# Patient Record
Sex: Male | Born: 1950 | Race: White | Hispanic: No | Smoking: Former smoker
Health system: Southern US, Community
[De-identification: ages and names within clinical notes are randomized; demographics above are authoritative.]

## PROBLEM LIST (undated history)

## (undated) DIAGNOSIS — Z87442 Personal history of urinary calculi: Secondary | ICD-10-CM

## (undated) DIAGNOSIS — K219 Gastro-esophageal reflux disease without esophagitis: Secondary | ICD-10-CM

## (undated) DIAGNOSIS — I10 Essential (primary) hypertension: Secondary | ICD-10-CM

## (undated) DIAGNOSIS — I2510 Atherosclerotic heart disease of native coronary artery without angina pectoris: Secondary | ICD-10-CM

## (undated) DIAGNOSIS — N200 Calculus of kidney: Secondary | ICD-10-CM

## (undated) DIAGNOSIS — E119 Type 2 diabetes mellitus without complications: Secondary | ICD-10-CM

## (undated) DIAGNOSIS — E785 Hyperlipidemia, unspecified: Secondary | ICD-10-CM

## (undated) DIAGNOSIS — M1990 Unspecified osteoarthritis, unspecified site: Secondary | ICD-10-CM

## (undated) DIAGNOSIS — F329 Major depressive disorder, single episode, unspecified: Secondary | ICD-10-CM

## (undated) DIAGNOSIS — R7989 Other specified abnormal findings of blood chemistry: Secondary | ICD-10-CM

## (undated) DIAGNOSIS — F419 Anxiety disorder, unspecified: Secondary | ICD-10-CM

## (undated) DIAGNOSIS — F32A Depression, unspecified: Secondary | ICD-10-CM

## (undated) HISTORY — DX: Major depressive disorder, single episode, unspecified: F32.9

## (undated) HISTORY — DX: Personal history of urinary calculi: Z87.442

## (undated) HISTORY — PX: COLONOSCOPY: SHX174

## (undated) HISTORY — DX: Depression, unspecified: F32.A

## (undated) HISTORY — PX: OTHER SURGICAL HISTORY: SHX169

## (undated) HISTORY — DX: Type 2 diabetes mellitus without complications: E11.9

## (undated) HISTORY — DX: Unspecified osteoarthritis, unspecified site: M19.90

## (undated) HISTORY — PX: VASECTOMY: SHX75

## (undated) HISTORY — DX: Gastro-esophageal reflux disease without esophagitis: K21.9

## (undated) HISTORY — DX: Calculus of kidney: N20.0

## (undated) HISTORY — DX: Essential (primary) hypertension: I10

---

## 2000-02-16 ENCOUNTER — Emergency Department (HOSPITAL_COMMUNITY): Admission: EM | Admit: 2000-02-16 | Discharge: 2000-02-16 | Payer: Self-pay

## 2003-10-31 ENCOUNTER — Emergency Department (HOSPITAL_COMMUNITY): Admission: EM | Admit: 2003-10-31 | Discharge: 2003-10-31 | Payer: Self-pay

## 2013-10-30 ENCOUNTER — Ambulatory Visit (INDEPENDENT_AMBULATORY_CARE_PROVIDER_SITE_OTHER): Payer: Self-pay | Admitting: Family Medicine

## 2013-10-30 VITALS — BP 152/90 | HR 109 | Temp 98.5°F | Resp 16 | Ht 71.5 in | Wt 220.0 lb

## 2013-10-30 DIAGNOSIS — H8110 Benign paroxysmal vertigo, unspecified ear: Secondary | ICD-10-CM

## 2013-10-30 DIAGNOSIS — R42 Dizziness and giddiness: Secondary | ICD-10-CM

## 2013-10-30 LAB — POCT CBC
Granulocyte percent: 69.6 %G (ref 37–80)
HCT, POC: 36 % — AB (ref 43.5–53.7)
Hemoglobin: 11.5 g/dL — AB (ref 14.1–18.1)
Lymph, poc: 2.8 (ref 0.6–3.4)
MCH, POC: 24.8 pg — AB (ref 27–31.2)
MCHC: 32 g/dL (ref 31.8–35.4)
MCV: 77.7 fL — AB (ref 80–97)
MID (cbc): 0.6 (ref 0–0.9)
MPV: 5.6 fL (ref 0–99.8)
POC Granulocyte: 7.9 — AB (ref 2–6.9)
POC LYMPH PERCENT: 24.9 %L (ref 10–50)
POC MID %: 5.5 %M (ref 0–12)
Platelet Count, POC: 422 10*3/uL (ref 142–424)
RBC: 4.63 M/uL — AB (ref 4.69–6.13)
RDW, POC: 17.4 %
WBC: 11.4 10*3/uL — AB (ref 4.6–10.2)

## 2013-10-30 LAB — POCT SEDIMENTATION RATE: POCT SED RATE: 118 mm/hr — AB (ref 0–22)

## 2013-10-30 LAB — GLUCOSE, POCT (MANUAL RESULT ENTRY): POC Glucose: 85 mg/dl (ref 70–99)

## 2013-10-30 MED ORDER — MECLIZINE HCL 25 MG PO TABS: 25.0000 mg | ORAL_TABLET | Freq: Three times a day (TID) | ORAL | Status: DC | PRN

## 2013-10-30 NOTE — Progress Notes (Addendum)
This chart was scribed for Delman Cheadle, MD by Einar Pheasant, ED Scribe. This patient was seen in room 3 and the patient's care was started at 1:55 PM.  Subjective:    Patient ID: Allen Whitehead, male    DOB: 1950-05-01, 63 y.o.   MRN: [id number]  Chief Complaint  Patient presents with  . Dizziness    HPI Allen Whitehead is a 63 y.o. male with a hx of kidney stones, DM, and depression. Presents today with his wife. Has not had medical care in >7 yrs as he does not have health insurance - is self-pay.  At that time in 2008 he states he had no medical problems and labs were excellent.  Pt states that 8 years ago he was diagnosed with Vertigo but because of insurance lapse he hasn't been able to follow up with his symptoms. He states that he takes Bonine periodically but reports taking it everyday, but that didn't make his symptoms better; it actually made his "dizziness" worse. However, with this current episodes the "dizziness" is present all the time for the past 2 months. He says that he has a hard time maintaining his balance and endorses associated bilateral leg weakness. He denies any change in appetite, chest pain, family hx of neuropathy, or falls.   There are no active problems to display for this patient.  Past Medical History  Diagnosis Date  . Depression   . History of kidney stones    No past surgical history on file. No Known Allergies Prior to Admission medications   Not on File   History   Social History  . Marital Status: Legally Separated    Spouse Name: N/A    Number of Children: N/A  . Years of Education: N/A   Occupational History  . Not on file.   Social History Main Topics  . Smoking status: Current Every Day Smoker -- 0.50 packs/day    Types: Cigarettes  . Smokeless tobacco: Never Used  . Alcohol Use: No  . Drug Use: No  . Sexual Activity: Not on file   Other Topics Concern  . Not on file   Social History Narrative  . No narrative on file    Review of Systems  Constitutional: Negative for fever, chills and diaphoresis.  HENT: Negative for congestion, ear discharge and sinus pressure.   Eyes: Negative for discharge.  Respiratory: Negative for cough.   Cardiovascular: Negative for chest pain.  Gastrointestinal: Negative for abdominal pain and diarrhea.  Genitourinary: Negative for frequency and hematuria.  Musculoskeletal: Negative for back pain.  Skin: Negative for rash.  Neurological: Positive for dizziness, weakness and light-headedness. Negative for seizures and headaches.  Psychiatric/Behavioral: Negative for hallucinations.      Triage vitals: BP 152/90  Pulse 109  Temp(Src) 98.5 F (36.9 C) (Oral)  Resp 16  Ht 5' 11.5" (1.816 m)  Wt 220 lb (99.791 kg)  BMI 30.26 kg/m2  SpO2 99%  Objective:   Physical Exam  Nursing note and vitals reviewed. Constitutional: He is oriented to person, place, and time. He appears well-developed and well-nourished. No distress.  HENT:  Head: Normocephalic and atraumatic.  Right Ear: Tympanic membrane normal.  Left Ear: Tympanic membrane normal.  Mouth/Throat: Uvula is midline, oropharynx is clear and moist and mucous membranes are normal.  Eyes: Conjunctivae are normal. Right eye exhibits no discharge. Left eye exhibits no discharge.  Neck: Neck supple.  Cardiovascular: Regular rhythm, S1 normal, S2 normal and normal heart sounds.  Tachycardia present.  Exam reveals no gallop and no friction rub.   No murmur heard. Pulses:      Carotid pulses are 2+ on the right side, and 2+ on the left side. No carotid bruits.  Pulmonary/Chest: Effort normal and breath sounds normal. No respiratory distress.  Decreased air movement, but lungs clear throughout.  Abdominal: Soft. Bowel sounds are normal. He exhibits no distension. There is no tenderness.  Musculoskeletal: Normal range of motion. He exhibits no edema and no tenderness.  Lymphadenopathy:    He has no cervical adenopathy.     He has no axillary adenopathy.  1.0 cm firm fluctuant subcutaneous cyst well defined with no erythema or tenderness. No induration.   Neurological: He is alert and oriented to person, place, and time.  Reflex Scores:      Patellar reflexes are 2+ on the right side and 2+ on the left side.      Achilles reflexes are 1+ on the right side and 1+ on the left side. 5/5 strength to lower extremities. Sig ataxia with poor tandem gait.  Skin: Skin is warm and dry.  Psychiatric: He has a normal mood and affect. His behavior is normal. Thought content normal.   Positive orthostatics EKG: sinus tach, no acute ischemic change Assessment & Plan:   Dizziness - Plan: EKG 12-Lead, POCT CBC, POCT glucose (manual entry), POCT SEDIMENTATION RATE, TSH  Benign paroxysmal positional vertigo, unspecified laterality - Plan: refilled meclizine.  Ova and parasite examination  Orthostatic lightheadedness Pt is orthostatic and anemic. As he is long-time smoker, it is likely that his nml hgb would be at the higher range so likely this mild anemia is much more symptomatic for pt.  Unsure of cause of orthostasis. If sxs cont, rec cardiology eval but pt declines for now due to cost.  Due to microcytic anemia, concern for GI bleed but pt refuses rectal or prostate exam in office - does agree to do home hemosure. Push fluids, add a little salt into diet.  Recheck in 2d w/ me. I am concerned pt could have colon cancer due to microcytic anemia of unknown etiology as well as his recent unintentional 40lb weight loss with stool changes but pt very adament that he is better now - these have entirely resolved and he is gaining weight..  Meds ordered this encounter  Medications  . meclizine (ANTIVERT) 25 MG tablet    Sig: Take 1 tablet (25 mg total) by mouth 3 (three) times daily as needed for dizziness.    Dispense:  60 tablet    Refill:  2    I personally performed the services described in this documentation, which was  scribed in my presence. The recorded information has been reviewed and considered, and addended by me as needed.  Delman Cheadle, MD MPH   Results for orders placed in visit on 10/30/13  TSH      Result Value Ref Range   TSH 1.900  0.350 - 4.500 uIU/mL  POCT CBC      Result Value Ref Range   WBC 11.4 (*) 4.6 - 10.2 K/uL   Lymph, poc 2.8  0.6 - 3.4   POC LYMPH PERCENT 24.9  10 - 50 %L   MID (cbc) 0.6  0 - 0.9   POC MID % 5.5  0 - 12 %M   POC Granulocyte 7.9 (*) 2 - 6.9   Granulocyte percent 69.6  37 - 80 %G   RBC 4.63 (*) 4.69 -  6.13 M/uL   Hemoglobin 11.5 (*) 14.1 - 18.1 g/dL   HCT, POC 36.0 (*) 43.5 - 53.7 %   MCV 77.7 (*) 80 - 97 fL   MCH, POC 24.8 (*) 27 - 31.2 pg   MCHC 32.0  31.8 - 35.4 g/dL   RDW, POC 17.4     Platelet Count, POC 422  142 - 424 K/uL   MPV 5.6  0 - 99.8 fL  GLUCOSE, POCT (MANUAL RESULT ENTRY)      Result Value Ref Range   POC Glucose 85  70 - 99 mg/dl  POCT SEDIMENTATION RATE      Result Value Ref Range   POCT SED RATE 118 (*) 0 - 22 mm/hr

## 2013-10-30 NOTE — Patient Instructions (Signed)
Orthostatic Hypotension Orthostatic hypotension is a sudden drop in blood pressure. It happens when you quickly stand up from a seated or lying position. You may feel dizzy or light-headed. This can last for just a few seconds or for up to a few minutes. It is usually not a serious problem. However, if this happens frequently or gets worse, it can be a sign of something more serious. CAUSES  Different things can cause orthostatic hypotension, including:   Loss of body fluids (dehydration).  Medicines that lower blood pressure.  Sudden changes in posture, such as standing up quickly after you have been sitting or lying down.  Taking too much of your medicine. SIGNS AND SYMPTOMS   Light-headedness or dizziness.   Fainting or near-fainting.   A fast heart rate.   Weakness.   Feeling tired (fatigue).  DIAGNOSIS  Your health care provider may do several things to help diagnose your condition and identify the cause. These may include:   Taking a medical history and doing a physical exam.  Checking your blood pressure. Your health care provider will check your blood pressure when you are:  Lying down.  Sitting.  Standing.  Using tilt table testing. In this test, you lie down on a table that moves from a lying position to a standing position. You will be strapped onto the table. This test monitors your blood pressure and heart rate when you are in different positions. TREATMENT  Treatment will vary depending on the cause. Possible treatments include:   Changing the dosage of your medicines.  Wearing compression stockings on your lower legs.  Standing up slowly after sitting or lying down.  Eating more salt.  Eating frequent, small meals.  In some cases, getting IV fluids.  Taking medicine to enhance fluid retention. HOME CARE INSTRUCTIONS  Only take over-the-counter or prescription medicines as directed by your health care provider.  Follow your health care  provider's instructions for changing the dosage of your current medicines.  Do not stop or adjust your medicine on your own.  Stand up slowly after sitting or lying down. This allows your body to adjust to the different position.  Wear compression stockings as directed.  Eat extra salt as directed.  Do not add extra salt to your diet unless directed to by your health care provider.  Eat frequent, small meals.  Avoid standing suddenly after eating.  Avoid hot showers or excessive heat as directed by your health care provider.  Keep all follow-up appointments. SEEK MEDICAL CARE IF:  You continue to feel dizzy or light-headed after standing.  You feel groggy or confused.  You feel cold, clammy, or sick to your stomach (nauseous).  You have blurred vision.  You feel short of breath. SEEK IMMEDIATE MEDICAL CARE IF:   You faint after standing.  You have chest pain.  You have difficulty breathing.   You lose feeling or movement in your arms or legs.   You have slurred speech or difficulty talking, or you are unable to talk.  MAKE SURE YOU:   Understand these instructions.  Will watch your condition.  Will get help right away if you are not doing well or get worse. Document Released: 12/28/2001 Document Revised: 01/12/2013 Document Reviewed: 10/30/2012 [facility] Patient Information 2015 [place], [place]. This information is not intended to replace advice given to you by your health care provider. Make sure you discuss any questions you have with your health care provider. Postural Orthostatic Tachycardia Syndrome Postural orthostatic tachycardia syndrome (  POTS) is an increased heart rate when going from a lying (supine) position to a standing position. The heart rate may increase more than 30 beats per minute (BPM) above its resting rate when going from a lying to a standing position. POTS occurs more frequently in women than in men.  SYMPTOMS  POTS symptoms may be  increased in the morning. Symptoms of POTS include:  Fainting or near fainting.  Inability to think clearly.  Extreme or chronic fatigue.  Exercise intolerance.  Chest pain.  Having the lower legs develop a reddish-blue color due to decreased blood flow (acrocyanosis). CAUSES POTS can be caused by different conditions. Sometimes, it has no known cause (idiopathic). Some causes of POTS include:  Viral illness.  Pregnancy.  Autoimmune diseases.  Medications.  Major surgery.  Trauma such as a car accident or major injury.  Medical conditions such as anemia, dehydration, and hyperthyroidism. DIAGNOSIS  POTS is diagnosed by:  Taking a complete history and physical exam.  Measuring the heart rate while lying and then upon standing.  Measuring blood pressure when going from a lying to a standing position. POTS is usually not associated with low blood pressure (orthostatic hypotension) when going from a lying to standing position. While standing, blood pressure should be taken 2, 5, and 10 minutes after getting up. TREATMENT  Treatment of POTS depends upon the severity of the symptoms. Treatment includes:  Drinking plenty of fluids to avoid getting dehydrated.  Avoiding very hot environments to not get overheated.  Increasing your dietary salt intake as instructed by your caregiver.  Taking different types of medications as prescribed for POTS.  Avoiding some classes of medications such as vasodilators and diuretics. SEEK IMMEDIATE MEDICAL CARE IF  You have severe chest pain that does not go away. Call your local emergency service immediately.  You feel your heart racing or beating rapidly.  You feel like passing out.  You have very confused thinking. MAKE SURE YOU  Understand these instructions.  Will watch your condition.  Will get help right away if you are not doing well or get worse. Document Released: 12/28/2001 Document Revised: 05/24/2013 Document  Reviewed: 03/07/2010 [facility] Patient Information 2015 [place], [place]. This information is not intended to replace advice given to you by your health care provider. Make sure you discuss any questions you have with your health care provider. Near-Syncope Near-syncope (commonly known as near fainting) is sudden weakness, dizziness, or feeling like you might pass out. During an episode of near-syncope, you may also develop pale skin, have tunnel vision, or feel sick to your stomach (nauseous). Near-syncope may occur when getting up after sitting or while standing for a long time. It is caused by a sudden decrease in blood flow to the brain. This decrease can result from various causes or triggers, most of which are not serious. However, because near-syncope can sometimes be a sign of something serious, a medical evaluation is required. The specific cause is often not determined. HOME CARE INSTRUCTIONS  Monitor your condition for any changes. The following actions may help to alleviate any discomfort you are experiencing:  Have someone stay with you until you feel stable.  Lie down right away and prop your feet up if you start feeling like you might faint. Breathe deeply and steadily. Wait until all the symptoms have passed. Most of these episodes last only a few minutes. You may feel tired for several hours.   Drink enough fluids to keep your urine clear or pale  yellow.   If you are taking blood pressure or heart medicine, get up slowly when seated or lying down. Take several minutes to sit and then stand. This can reduce dizziness.  Follow up with your health care provider as directed. SEEK IMMEDIATE MEDICAL CARE IF:   You have a severe headache.   You have unusual pain in the chest, abdomen, or back.   You are bleeding from the mouth or rectum, or you have black or tarry stool.   You have an irregular or very fast heartbeat.   You have repeated fainting or have seizure-like jerking  during an episode.   You faint when sitting or lying down.   You have confusion.   You have difficulty walking.   You have severe weakness.   You have vision problems.  MAKE SURE YOU:   Understand these instructions.  Will watch your condition.  Will get help right away if you are not doing well or get worse. Document Released: 01/07/2005 Document Revised: 01/12/2013 Document Reviewed: 06/12/2012 [facility] Patient Information 2015 [place], [place]. This information is not intended to replace advice given to you by your health care provider. Make sure you discuss any questions you have with your health care provider. Iron Deficiency Anemia Anemia is a condition in which there are less red blood cells or hemoglobin in the blood than normal. Hemoglobin is the part of red blood cells that carries oxygen. Iron deficiency anemia is anemia caused by too little iron. It is the most common type of anemia. It may leave you tired and short of breath. CAUSES   Lack of iron in the diet.  Poor absorption of iron, as seen with intestinal disorders.  Intestinal bleeding.  Heavy periods. SIGNS AND SYMPTOMS  Mild anemia may not be noticeable. Symptoms may include:  Fatigue.  Headache.  Pale skin.  Weakness.  Tiredness.  Shortness of breath.  Dizziness.  Cold hands and feet.  Fast or irregular heartbeat. DIAGNOSIS  Diagnosis requires a thorough evaluation and physical exam by your health care provider. Blood tests are generally used to confirm iron deficiency anemia. Additional tests may be done to find the underlying cause of your anemia. These may include:  Testing for blood in the stool (fecal occult blood test).  A procedure to see inside the colon and rectum (colonoscopy).  A procedure to see inside the esophagus and stomach (endoscopy). TREATMENT  Iron deficiency anemia is treated by correcting the cause of the deficiency. Treatment may involve:  Adding iron-rich  foods to your diet.  Taking iron supplements. Pregnant or breastfeeding women need to take extra iron because their normal diet usually does not provide the required amount.  Taking vitamins. Vitamin C improves the absorption of iron. Your health care provider may recommend that you take your iron tablets with a glass of orange juice or vitamin C supplement.  Medicines to make heavy menstrual flow lighter.  Surgery. HOME CARE INSTRUCTIONS   Take iron as directed by your health care provider.  If you cannot tolerate taking iron supplements by mouth, talk to your health care provider about taking them through a vein (intravenously) or an injection into a muscle.  For the best iron absorption, iron supplements should be taken on an empty stomach. If you cannot tolerate them on an empty stomach, you may need to take them with food.  Do not drink milk or take antacids at the same time as your iron supplements. Milk and antacids may interfere with the  absorption of iron.  Iron supplements can cause constipation. Make sure to include fiber in your diet to prevent constipation. A stool softener may also be recommended.  Take vitamins as directed by your health care provider.  Eat a diet rich in iron. Foods high in iron include liver, lean beef, whole-grain bread, eggs, dried fruit, and dark green leafy vegetables. SEEK IMMEDIATE MEDICAL CARE IF:   You faint. If this happens, do not drive. Call your local emergency services (911 in [place] if no other help is available.  You have chest pain.  You feel nauseous or vomit.  You have severe or increased shortness of breath with activity.  You feel weak.  You have a rapid heartbeat.  You have unexplained sweating.  You become light-headed when getting up from a chair or bed. MAKE SURE YOU:   Understand these instructions.  Will watch your condition.  Will get help right away if you are not doing well or get worse. Document Released:  01/05/2000 Document Revised: 01/12/2013 Document Reviewed: 09/14/2012 [facility] Patient Information 2015 [place], [place]. This information is not intended to replace advice given to you by your health care provider. Make sure you discuss any questions you have with your health care provider. Iron-Rich Diet An iron-rich diet contains foods that are good sources of iron. Iron is an important mineral that helps your body produce hemoglobin. Hemoglobin is a protein in red blood cells that carries oxygen to the body's tissues. Sometimes, the iron level in your blood can be low. This may be caused by:  A lack of iron in your diet.  Blood loss.  Times of growth, such as during pregnancy or during a child's growth and development. Low levels of iron can cause a decrease in the number of red blood cells. This can result in iron deficiency anemia. Iron deficiency anemia symptoms include:  Tiredness.  Weakness.  Irritability.  Increased chance of infection. Here are some recommendations for daily iron intake:  Males older than 61 years of age need 8 mg of iron per day.  Women ages 61 to 40 need 18 mg of iron per day.  Pregnant women need 27 mg of iron per day, and women who are over 2 years of age and breastfeeding need 9 mg of iron per day.  Women over the age of 92 need 8 mg of iron per day. SOURCES OF IRON There are 2 types of iron that are found in food: heme iron and nonheme iron. Heme iron is absorbed by the body better than nonheme iron. Heme iron is found in meat, poultry, and fish. Nonheme iron is found in grains, beans, and vegetables. Heme Iron Sources Food / Iron (mg)  Chicken liver, 3 oz (85 g)/ 10 mg  Beef liver, 3 oz (85 g)/ 5.5 mg  Oysters, 3 oz (85 g)/ 8 mg  Beef, 3 oz (85 g)/ 2 to 3 mg  Shrimp, 3 oz (85 g)/ 2.8 mg  [place], 3 oz (85 g)/ 2 mg  Chicken, 3 oz (85 g) / 1 mg  Fish (tuna, halibut), 3 oz (85 g)/ 1 mg  Pork, 3 oz (85 g)/ 0.9 mg Nonheme Iron  Sources Food / Iron (mg)  Ready-to-eat breakfast cereal, iron-fortified / 3.9 to 7 mg  Tofu,  cup / 3.4 mg  Kidney beans,  cup / 2.6 mg  Baked potato with skin / 2.7 mg  Asparagus,  cup / 2.2 mg  Avocado / 2 mg  Dried peaches,  cup / 1.6 mg  Raisins,  cup / 1.5 mg  Soy milk, 1 cup / 1.5 mg  Whole-wheat bread, 1 slice / 1.2 mg  Spinach, 1 cup / 0.8 mg  Broccoli,  cup / 0.6 mg IRON ABSORPTION Certain foods can decrease the body's absorption of iron. Try to avoid these foods and beverages while eating meals with iron-containing foods:  Coffee.  Tea.  Fiber.  Soy. Foods containing vitamin C can help increase the amount of iron your body absorbs from iron sources, especially from nonheme sources. Eat foods with vitamin C along with iron-containing foods to increase your iron absorption. Foods that are high in vitamin C include many fruits and vegetables. Some good sources are:  Fresh orange juice.  Oranges.  Strawberries.  Mangoes.  Grapefruit.  Red bell peppers.  Green bell peppers.  Broccoli.  Potatoes with skin.  Tomato juice. Document Released: 08/21/2004 Document Revised: 04/01/2011 Document Reviewed: 06/28/2010 [facility] Patient Information 2015 [place], [place]. This information is not intended to replace advice given to you by your health care provider. Make sure you discuss any questions you have with your health care provider.

## 2013-10-31 LAB — TSH: TSH: 1.9 u[IU]/mL (ref 0.350–4.500)

## 2013-11-03 ENCOUNTER — Encounter: Payer: Self-pay | Admitting: Family Medicine

## 2013-11-08 ENCOUNTER — Telehealth: Payer: Self-pay

## 2013-11-08 NOTE — Telephone Encounter (Signed)
I'm sorry he does not have insurance but I am concerned that he could be very ill and he needs an in-depth medical evaluation and repeat and additional labs with possible need for imaging or specialist eval. I am unable to estimate tests needed or potential costs without repeat in office assessment with rectal and prostate exam (which he refused at last visit). There are below resources that pt can look into - but I am VERY concerned that he should not wait months for an appt - he needs to be seen asap - like TODAY  Please call the [address] office at [phone] Please call the clinic below to get an appointment asap.  They are a clinic set up by [facility] to care for people without health insurance so may be able to get you care at much cheaper cost with a large discount on labs, imaging, and medication. [address] [place], [place] [address] Hours of Operation Mon - Fri: 9 a.m. - 6 p.m. Main: [phone];

## 2013-11-08 NOTE — Telephone Encounter (Signed)
Pt left a message on lab VM asking about labs. Read him Dr. Brigitte Pulse letter. Dr. Brigitte Pulse, pt does not have insurance. Unsure if he's unable to come back in due to cost. Wants to know what you would think about doing test wise so he can calculate costs. Please advise. Thanks

## 2013-11-08 NOTE — Telephone Encounter (Signed)
As discussed w/ pt in office visit, both cardiac disease and colon cancer are potential etiology for sxs so needs eval asap.

## 2013-11-09 ENCOUNTER — Other Ambulatory Visit: Payer: Self-pay

## 2013-11-09 ENCOUNTER — Encounter (HOSPITAL_COMMUNITY): Payer: Self-pay | Admitting: Emergency Medicine

## 2013-11-09 ENCOUNTER — Emergency Department (HOSPITAL_COMMUNITY): Payer: Self-pay

## 2013-11-09 ENCOUNTER — Emergency Department (HOSPITAL_COMMUNITY)
Admission: EM | Admit: 2013-11-09 | Discharge: 2013-11-09 | Disposition: A | Discharge status: Home / Self Care | Payer: Self-pay | Attending: Emergency Medicine | Admitting: Emergency Medicine

## 2013-11-09 DIAGNOSIS — Z720 Tobacco use: Secondary | ICD-10-CM | POA: Insufficient documentation

## 2013-11-09 DIAGNOSIS — Z87442 Personal history of urinary calculi: Secondary | ICD-10-CM | POA: Insufficient documentation

## 2013-11-09 DIAGNOSIS — R42 Dizziness and giddiness: Secondary | ICD-10-CM | POA: Insufficient documentation

## 2013-11-09 DIAGNOSIS — Z79899 Other long term (current) drug therapy: Secondary | ICD-10-CM | POA: Insufficient documentation

## 2013-11-09 DIAGNOSIS — Z8659 Personal history of other mental and behavioral disorders: Secondary | ICD-10-CM | POA: Insufficient documentation

## 2013-11-09 LAB — URINALYSIS, ROUTINE W REFLEX MICROSCOPIC
Bilirubin Urine: NEGATIVE
Glucose, UA: NEGATIVE mg/dL
Hgb urine dipstick: NEGATIVE
Ketones, ur: NEGATIVE mg/dL
Leukocytes, UA: NEGATIVE
Nitrite: NEGATIVE
Protein, ur: NEGATIVE mg/dL
Specific Gravity, Urine: 1.022 (ref 1.005–1.030)
Urobilinogen, UA: 0.2 mg/dL (ref 0.0–1.0)
pH: 6 (ref 5.0–8.0)

## 2013-11-09 LAB — BASIC METABOLIC PANEL
Anion gap: 14 (ref 5–15)
BUN: 11 mg/dL (ref 6–23)
CO2: 24 mEq/L (ref 19–32)
Calcium: 9.6 mg/dL (ref 8.4–10.5)
Chloride: 98 mEq/L (ref 96–112)
Creatinine, Ser: 0.65 mg/dL (ref 0.50–1.35)
GFR calc Af Amer: >90 mL/min (ref >90)
GFR calc non Af Amer: >90 mL/min (ref >90)
Glucose, Bld: 133 mg/dL — ABNORMAL HIGH (ref 70–99)
Potassium: 4.2 mEq/L (ref 3.7–5.3)
Sodium: 136 mEq/L — ABNORMAL LOW (ref 137–147)

## 2013-11-09 LAB — CBC
HCT: 35.7 % — ABNORMAL LOW (ref 39.0–52.0)
Hemoglobin: 11.5 g/dL — ABNORMAL LOW (ref 13.0–17.0)
MCH: 25.2 pg — ABNORMAL LOW (ref 26.0–34.0)
MCHC: 32.2 g/dL (ref 30.0–36.0)
MCV: 78.1 fL (ref 78.0–100.0)
Platelets: 371 10*3/uL (ref 150–400)
RBC: 4.57 MIL/uL (ref 4.22–5.81)
RDW: 16.1 % — ABNORMAL HIGH (ref 11.5–15.5)
WBC: 9.7 10*3/uL (ref 4.0–10.5)

## 2013-11-09 MED ORDER — LORAZEPAM 2 MG/ML IJ SOLN
1.0000 mg | Freq: Once | INTRAMUSCULAR | Status: AC
  Administered 2013-11-09: 1 mg via INTRAVENOUS
  Filled 2013-11-09: qty 1

## 2013-11-09 MED ORDER — SODIUM CHLORIDE 0.9 % IV BOLUS (SEPSIS)
1000.0000 mL | Freq: Once | INTRAVENOUS | Status: AC
  Administered 2013-11-09: 1000 mL via INTRAVENOUS

## 2013-11-09 MED ORDER — MECLIZINE HCL 25 MG PO TABS: 12.5000 mg | ORAL_TABLET | Freq: Four times a day (QID) | ORAL | Status: DC

## 2013-11-09 NOTE — Discharge Instructions (Signed)

## 2013-11-09 NOTE — ED Provider Notes (Signed)
CSN: [id number]     Arrival date & time 11/09/13  1400 History   First MD Initiated Contact with Patient 11/09/13 1551     Chief Complaint  Patient presents with  . Dizziness     (Consider location/radiation/quality/duration/timing/severity/associated sxs/prior Treatment) HPI 63 year old male presents with dizziness over the past 10 days. He states that he's been feeling off balance like she's going to fall over. This comes and goes but is most commonly occurring when he stands up. States his not quite feel he is going to pass out. He was diagnosed with vertigo 8 years ago as a these were not similar symptoms. He states he had headaches and right-sided neck pain over the last couple months this is resolved. He also had an intermittent right arm intentional tremor that is new over the past couple months. Denies any ear ringing. His legs feel "heavy" bilaterally, and have for months. No incontinence. No current headaches or blurry vision. No chest pain. When he does have the dizziness feels like his heart rate does increase some. Denies chest pain or dyspnea. Low back pain for past several months but none currently.  Past Medical History  Diagnosis Date  . Depression   . History of kidney stones    History reviewed. No pertinent past surgical history. Family History  Problem Relation Age of Onset  . Cancer Mother   . Hypertension Mother   . Cancer Father   . Diabetes Father    History  Substance Use Topics  . Smoking status: Current Every Day Smoker -- 0.50 packs/day    Types: Cigarettes  . Smokeless tobacco: Never Used  . Alcohol Use: No    Review of Systems  Constitutional: Negative for fever.  Respiratory: Negative for shortness of breath.   Cardiovascular: Negative for chest pain.  Gastrointestinal: Negative for nausea and vomiting.  Neurological: Positive for dizziness. Negative for weakness, numbness and headaches.  All other systems reviewed and are  negative.     Allergies  Review of patient's allergies indicates no known allergies.  Home Medications   Prior to Admission medications   Medication Sig Start Date End Date Taking? Authorizing Provider  Ferrous Sulfate (SLOW FE PO) Take 1 tablet by mouth daily.   Yes Historical Provider, MD  Meclizine HCl (BONINE PO) Take 1 tablet by mouth daily. Bonine OTC   Yes Historical Provider, MD  meclizine (ANTIVERT) 25 MG tablet Take 1 tablet (25 mg total) by mouth 3 (three) times daily as needed for dizziness. 10/30/13   Shawnee Knapp, MD   BP 153/84  Pulse 114  Temp(Src) 98.6 F (37 C) (Oral)  SpO2 100% Physical Exam  Nursing note and vitals reviewed. Constitutional: He is oriented to person, place, and time. He appears well-developed and well-nourished.  HENT:  Head: Normocephalic and atraumatic.  Right Ear: External ear normal.  Left Ear: External ear normal.  Nose: Nose normal.  Eyes: EOM are normal. Pupils are equal, round, and reactive to light. Right eye exhibits no discharge. Left eye exhibits no discharge.  Neck: Neck supple.  Cardiovascular: Normal rate, regular rhythm, normal heart sounds and intact distal pulses.   Pulmonary/Chest: Effort normal.  Abdominal: Soft. There is no tenderness.  Musculoskeletal: He exhibits no edema.  Neurological: He is alert and oriented to person, place, and time.  Reflex Scores:      Bicep reflexes are 2+ on the right side and 2+ on the left side.      Patellar reflexes are 2+  on the right side and 2+ on the left side. CN 2-12 grossly intact. 5/5 strength in all 4 extremities. Normal finger to nose and RAM. Feels too dizzy to walk when stood up.  Skin: Skin is warm and dry.    ED Course  Procedures (including critical care time) Labs Review Labs Reviewed  CBC - Abnormal; Notable for the following:    Hemoglobin 11.5 (*)    HCT 35.7 (*)    MCH 25.2 (*)    RDW 16.1 (*)    All other components within normal limits  BASIC METABOLIC  PANEL - Abnormal; Notable for the following:    Sodium 136 (*)    Glucose, Bld 133 (*)    All other components within normal limits  URINALYSIS, ROUTINE W REFLEX MICROSCOPIC - Abnormal; Notable for the following:    APPearance CLOUDY (*)    All other components within normal limits    Imaging Review Dg Chest 2 View  11/09/2013   CLINICAL DATA:  63 year old male with 10 day history of dizziness, vertigo, presenting with anemia and dehydration.  EXAM: CHEST  2 VIEW  COMPARISON:  No priors.  FINDINGS: Nodular opacity noted in the right mid lung seen only on the frontal projection, suspicious for a pulmonary nodule measuring approximately 1.5 x 0.8 cm. No acute consolidative airspace disease. No pleural effusions. No evidence of pulmonary edema. Heart size and mediastinal contours are within normal limits. Atherosclerosis in the thoracic aorta.  IMPRESSION: 1. No radiographic evidence of acute cardiopulmonary disease. 2. 1.5 x 0.8 cm nodular opacity projecting over the right midlung. Repeat standing PA and lateral chest radiograph in 2-3 months is recommended to ensure the stability or resolution of this finding. Alternatively, if there is strong clinical concern for underlying malignancy, a follow-up noncontrast chest CT could be performed at this time.   Electronically Signed   By: Vinnie Langton M.D.   On: 11/09/2013 19:13   Ct Head Wo Contrast  11/09/2013   CLINICAL DATA:  Ten day history of vertigo  EXAM: CT HEAD WITHOUT CONTRAST  TECHNIQUE: Contiguous axial images were obtained from the base of the skull through the vertex without intravenous contrast.  COMPARISON:  10/31/2003  FINDINGS: Bony calvarium is intact. No findings to suggest acute hemorrhage, acute infarction or space-occupying mass lesion are noted.  IMPRESSION: No acute intracranial abnormality identified.   Electronically Signed   By: Inez Catalina M.D.   On: 11/09/2013 17:02   Mr Brain Wo Contrast  11/09/2013   CLINICAL DATA:   Initial evaluation for dizziness, vertigo, dehydration for 10 days.  EXAM: MRI HEAD WITHOUT CONTRAST  TECHNIQUE: Multiplanar, multiecho pulse sequences of the brain and surrounding structures were obtained without intravenous contrast.  COMPARISON:  Prior CT from earlier the same day.  FINDINGS: Study is somewhat limited as the patient was unable to tolerate the full length of the exam.  No abnormal foci of restricted diffusion are seen to suggest acute intracranial infarct. Gray-white matter differentiation is well maintained. Normal flow voids seen within the intracranial vasculature.  No intracranial hemorrhage identified.  Mild scattered and patchy T2/FLAIR hyperintensity seen within the periventricular and deep white matter both cerebral hemispheres noted, nonspecific, but likely related to mild chronic small vessel ischemic changes. Cerebral volume within normal limits for patient age.  No mass lesion or midline shift. Ventricles within normal limits without evidence of hydrocephalus. No extra-axial fluid collection.  Craniocervical junction is normal. Pituitary gland unremarkable. No acute abnormality seen about  the orbits.  Visualized bone marrow signal intensity within normal limits. Scalp soft tissues are unremarkable.  Paranasal sinuses and mastoid air cells are clear. Inner ear structures within normal limits.  IMPRESSION: 1. No acute intracranial infarct or other abnormality identified. 2. Mild chronic small vessel ischemic disease.   Electronically Signed   By: Jeannine Boga M.D.   On: 11/09/2013 22:15     EKG Interpretation   Date/Time:  Tuesday November 09 2013 14:25:48 EDT Ventricular Rate:  106 PR Interval:  148 QRS Duration: 86 QT Interval:  330 QTC Calculation: 438 R Axis:   110 Text Interpretation:  Suspect arm lead reversal, interpretation  assumes no reversal Sinus tachycardia Right axis deviation Abnormal ECG No  old tracing to compare Confirmed by [employer]  ([id number]) on  11/09/2013 3:52:17 PM      MDM   Final diagnoses:  Dizziness    Patient's dizziness is of unclear etiology. Has normal neuro exam. Able to ambulate but appears somewhat unsteady but no frank ataxia. Workup unremarkable. MRI only partially done but no obvious pathology. Given ativan prior to MRI due to patient stating he has PTSD and claustrophobia, but patient had an adverse reaction and became agitated and confused. Given length of symptoms and EKG appearance, I have low suspicion for arrhythmia or cardiac cause. Likely peripheral vertigo or other uncertain process. Will need continued workup by PCP, discussed discharge plan and strict return precautions with patient and family.    Ephraim Hamburger, MD 11/10/13 [id number]

## 2013-11-09 NOTE — ED Notes (Signed)
Pt report dizziness for 10 days with hx of vertigo, anemia, and dehydration. Pt denies CP, SOB, or other pain.

## 2013-11-09 NOTE — ED Notes (Addendum)
Unable to perform orthostatic at this time when I tried to lay patient back to do the laying ones the patient started crying saying it was his nervous so I put the chair back up.  Went back in and check on patient patient had stop crying and was talking to his wife.  I made nurse aware.

## 2013-11-09 NOTE — ED Notes (Signed)
MRI reports MRI will be around 1930. MRI will call 10-15 minutes before so staff know to administer ativan.

## 2013-11-09 NOTE — ED Notes (Signed)
Pt c/o intermittent dizziness x 10 days and intermittent lower back pain x a couple weeks.  Denies pain.  Sts dizziness increases when he stands.  Pt reports he was seen at Urgent Care x 10 days and told that he was anemic and dehydrated.  Hx of vertigo.

## 2013-11-09 NOTE — Telephone Encounter (Signed)
Lmom to call back. 

## 2013-11-09 NOTE — ED Notes (Signed)
Patient transported to MRI 

## 2013-11-09 NOTE — Telephone Encounter (Signed)
LM for rtn call. 

## 2013-11-10 ENCOUNTER — Ambulatory Visit (INDEPENDENT_AMBULATORY_CARE_PROVIDER_SITE_OTHER): Payer: Self-pay | Admitting: Family Medicine

## 2013-11-10 VITALS — BP 154/100 | HR 126 | Temp 98.7°F | Resp 18

## 2013-11-10 DIAGNOSIS — R42 Dizziness and giddiness: Secondary | ICD-10-CM

## 2013-11-10 DIAGNOSIS — R270 Ataxia, unspecified: Secondary | ICD-10-CM

## 2013-11-10 DIAGNOSIS — R000 Tachycardia, unspecified: Secondary | ICD-10-CM

## 2013-11-10 DIAGNOSIS — I951 Orthostatic hypotension: Secondary | ICD-10-CM

## 2013-11-10 LAB — POCT SEDIMENTATION RATE: POCT SED RATE: 120 mm/hr — AB (ref 0–22)

## 2013-11-10 NOTE — Progress Notes (Signed)
63 yo gentleman with vertigo x 11 days.  He has had vertigo in the past.  He describes the dizziness as if he were on a boat in heavy seas.  No tinnitus or hearing change.  He was seen 11 days ago by Dr. Brigitte Pulse and his CBC showed mild anemia, but the sed rate was 118.    Thyroid was normal.  He felt worse yesterday and went to the ED where an MRI was done and CBC, BMET was done with no significant change.  He spent over 6 hours in the ED and eventually left without a diagnosis.  No joint pains.  No vomiting.  PMHx:  Smoker, h/o kidney stones  About a month ago he had right cervical pain that radiated up right occipital area.  He lives in same house as ex-wife.  He is an ex-firefighter  Objective:  Alert and articulate.  NAD.  Walking with a cane. HEENT:  No nystagmus.  Fundi show increased cup to disc ration suggestive of glaucoma.  TM's normal Neck:  No bruit, no thyromegaly, supple, no adenopathy Chest:  Clear Heart:  Rate about 130, occasionally irregular, no murmur Abdomen: soft with fullness, RUQ fullness without palpable liver edge Skin:  Scattered healing furuncles Musculoskeletal:  No significant joint swelling or decreased ROM   BP standing 110/70 Sitting:  128/80  CLINICAL DATA: Initial evaluation for dizziness, vertigo,  dehydration for 10 days.  EXAM:  MRI HEAD WITHOUT CONTRAST on November 09, 2013 TECHNIQUE:  Multiplanar, multiecho pulse sequences of the brain and surrounding  structures were obtained without intravenous contrast.  COMPARISON: Prior CT from earlier the same day.  FINDINGS:  Study is somewhat limited as the patient was unable to tolerate the  full length of the exam.  No abnormal foci of restricted diffusion are seen to suggest acute  intracranial infarct. Gray-white matter differentiation is well  maintained. Normal flow voids seen within the intracranial  vasculature.  No intracranial hemorrhage identified.  Mild scattered and patchy T2/FLAIR  hyperintensity seen within the  periventricular and deep white matter both cerebral hemispheres  noted, nonspecific, but likely related to mild chronic small vessel  ischemic changes. Cerebral volume within normal limits for patient  age.  No mass lesion or midline shift. Ventricles within normal limits  without evidence of hydrocephalus. No extra-axial fluid collection.  Craniocervical junction is normal. Pituitary gland unremarkable. No  acute abnormality seen about the orbits.  Visualized bone marrow signal intensity within normal limits. Scalp  soft tissues are unremarkable.  Paranasal sinuses and mastoid air cells are clear. Inner ear  structures within normal limits.  IMPRESSION:  1. No acute intracranial infarct or other abnormality identified.  2. Mild chronic small vessel ischemic disease.  Electronically Signed  By: Jeannine Boga M.D.         Ref Range 1wk ago     POCT SED RATE 0 - 22 mm/hr 118 (A)      Resulting Agency [facility]      Assessment: Patient has postural tachycardia, superhigh sedimentation rate, mild anemia and orthostasis.    plan: Urgent referral to cardiology, patient to rest tonight and drink plenty of fluids and go to emergency room if he feels worse    signed, Robyn Haber

## 2013-11-10 NOTE — Patient Instructions (Signed)
63 yo gentleman with vertigo x 11 days.  He has had vertigo in the past.  He describes the dizziness as if he were on a boat in heavy seas.  No tinnitus or hearing change.  He was seen 11 days ago by Dr. Brigitte Pulse and his CBC showed mild anemia, but the sed rate was 118.    Thyroid was normal.  He felt worse yesterday and went to the ED where an MRI was done and CBC, BMET was done with no significant change.  He spent over 6 hours in the ED and eventually left without a diagnosis.  No joint pains.  No vomiting.  PMHx:  Smoker, h/o kidney stones  About a month ago he had right cervical pain that radiated up right occipital area.  He lives in same house as ex-wife.  He is an ex-firefighter  Objective:  Alert and articulate.  NAD.  Walking with a cane. HEENT:  No nystagmus.  Fundi show increased cup to disc ration suggestive of glaucoma.  TM's normal Neck:  No bruit, no thyromegaly, supple, no adenopathy Chest:  Clear Heart:  Rate about 130, occasionally irregular, no murmur Abdomen: soft with fullness, RUQ fullness without palpable liver edge Skin:  Scattered healing furuncles Musculoskeletal:  No significant joint swelling or decreased ROM   BP standing 110/70 Sitting:  128/80  CLINICAL DATA: Initial evaluation for dizziness, vertigo,  dehydration for 10 days.  EXAM:  MRI HEAD WITHOUT CONTRAST on November 09, 2013 TECHNIQUE:  Multiplanar, multiecho pulse sequences of the brain and surrounding  structures were obtained without intravenous contrast.  COMPARISON: Prior CT from earlier the same day.  FINDINGS:  Study is somewhat limited as the patient was unable to tolerate the  full length of the exam.  No abnormal foci of restricted diffusion are seen to suggest acute  intracranial infarct. Gray-white matter differentiation is well  maintained. Normal flow voids seen within the intracranial  vasculature.  No intracranial hemorrhage identified.  Mild scattered and patchy T2/FLAIR  hyperintensity seen within the  periventricular and deep white matter both cerebral hemispheres  noted, nonspecific, but likely related to mild chronic small vessel  ischemic changes. Cerebral volume within normal limits for patient  age.  No mass lesion or midline shift. Ventricles within normal limits  without evidence of hydrocephalus. No extra-axial fluid collection.  Craniocervical junction is normal. Pituitary gland unremarkable. No  acute abnormality seen about the orbits.  Visualized bone marrow signal intensity within normal limits. Scalp  soft tissues are unremarkable.  Paranasal sinuses and mastoid air cells are clear. Inner ear  structures within normal limits.  IMPRESSION:  1. No acute intracranial infarct or other abnormality identified.  2. Mild chronic small vessel ischemic disease.  Electronically Signed  By: Jeannine Boga M.D.         Ref Range 1wk ago     POCT SED RATE 0 - 22 mm/hr 118 (A)      Resulting Agency [facility]      Assessment: Patient has postural tachycardia, superhigh sedimentation rate, mild anemia and orthostasis.    plan: Urgent referral to cardiology, patient to rest tonight and drink plenty of fluids and go to emergency room if he feels worse    signed, Robyn Haber

## 2013-11-10 NOTE — Telephone Encounter (Signed)
Pt was seen today.

## 2013-11-10 NOTE — Telephone Encounter (Signed)
Spoke to pt- he is on his way into the office to be evaluated again, he was in the hospital yesterday with the same concerns.

## 2013-11-11 ENCOUNTER — Telehealth: Payer: Self-pay

## 2013-11-11 NOTE — Telephone Encounter (Signed)
Spoke to pt, was seen yesterday, states dr L was going to arrange for him to go to the hospital, but has not received a call yet. Is he still supposed to go to the hospital?  Please advise.

## 2013-11-11 NOTE — Telephone Encounter (Signed)
Patient says Dr L wanted him to go the hospital today, but would call him to talk with him first. Please advise

## 2013-11-13 ENCOUNTER — Emergency Department (HOSPITAL_COMMUNITY): Payer: Self-pay

## 2013-11-13 ENCOUNTER — Encounter (HOSPITAL_COMMUNITY): Payer: Self-pay | Admitting: Emergency Medicine

## 2013-11-13 ENCOUNTER — Emergency Department (HOSPITAL_COMMUNITY)
Admission: EM | Admit: 2013-11-13 | Discharge: 2013-11-13 | Disposition: A | Discharge status: Home / Self Care | Payer: Self-pay | Attending: Emergency Medicine | Admitting: Emergency Medicine

## 2013-11-13 ENCOUNTER — Other Ambulatory Visit: Payer: Self-pay

## 2013-11-13 DIAGNOSIS — R002 Palpitations: Secondary | ICD-10-CM | POA: Insufficient documentation

## 2013-11-13 DIAGNOSIS — R110 Nausea: Secondary | ICD-10-CM | POA: Insufficient documentation

## 2013-11-13 DIAGNOSIS — Z79899 Other long term (current) drug therapy: Secondary | ICD-10-CM | POA: Insufficient documentation

## 2013-11-13 DIAGNOSIS — H539 Unspecified visual disturbance: Secondary | ICD-10-CM | POA: Insufficient documentation

## 2013-11-13 DIAGNOSIS — R531 Weakness: Secondary | ICD-10-CM | POA: Insufficient documentation

## 2013-11-13 DIAGNOSIS — R42 Dizziness and giddiness: Secondary | ICD-10-CM | POA: Insufficient documentation

## 2013-11-13 DIAGNOSIS — Z87442 Personal history of urinary calculi: Secondary | ICD-10-CM | POA: Insufficient documentation

## 2013-11-13 DIAGNOSIS — Z8659 Personal history of other mental and behavioral disorders: Secondary | ICD-10-CM | POA: Insufficient documentation

## 2013-11-13 DIAGNOSIS — Z720 Tobacco use: Secondary | ICD-10-CM | POA: Insufficient documentation

## 2013-11-13 LAB — URINALYSIS, ROUTINE W REFLEX MICROSCOPIC
Bilirubin Urine: NEGATIVE
GLUCOSE, UA: NEGATIVE mg/dL
KETONES UR: NEGATIVE mg/dL
Leukocytes, UA: NEGATIVE
Nitrite: NEGATIVE
PROTEIN: NEGATIVE mg/dL
Specific Gravity, Urine: 1.008 (ref 1.005–1.030)
Urobilinogen, UA: 0.2 mg/dL (ref 0.0–1.0)
pH: 6.5 (ref 5.0–8.0)

## 2013-11-13 LAB — TROPONIN I: Troponin I: <0.3 ng/mL (ref <0.30)

## 2013-11-13 LAB — COMPREHENSIVE METABOLIC PANEL
ALT: 15 U/L (ref 0–53)
ANION GAP: 14 (ref 5–15)
AST: 16 U/L (ref 0–37)
Albumin: 2.8 g/dL — ABNORMAL LOW (ref 3.5–5.2)
Alkaline Phosphatase: 89 U/L (ref 39–117)
BUN: 9 mg/dL (ref 6–23)
CALCIUM: 9.4 mg/dL (ref 8.4–10.5)
CO2: 23 mEq/L (ref 19–32)
Chloride: 98 mEq/L (ref 96–112)
Creatinine, Ser: 0.65 mg/dL (ref 0.50–1.35)
GLUCOSE: 144 mg/dL — AB (ref 70–99)
Potassium: 3.8 mEq/L (ref 3.7–5.3)
SODIUM: 135 meq/L — AB (ref 137–147)
Total Bilirubin: <0.2 mg/dL — ABNORMAL LOW (ref 0.3–1.2)
Total Protein: 8.3 g/dL (ref 6.0–8.3)

## 2013-11-13 LAB — CBC WITH DIFFERENTIAL/PLATELET
Basophils Absolute: 0 10*3/uL (ref 0.0–0.1)
Basophils Relative: 0 % (ref 0–1)
EOS PCT: 1 % (ref 0–5)
Eosinophils Absolute: 0.1 10*3/uL (ref 0.0–0.7)
HCT: 33.3 % — ABNORMAL LOW (ref 39.0–52.0)
Hemoglobin: 10.9 g/dL — ABNORMAL LOW (ref 13.0–17.0)
LYMPHS ABS: 2.4 10*3/uL (ref 0.7–4.0)
Lymphocytes Relative: 25 % (ref 12–46)
MCH: 25.3 pg — AB (ref 26.0–34.0)
MCHC: 32.7 g/dL (ref 30.0–36.0)
MCV: 77.3 fL — AB (ref 78.0–100.0)
MONO ABS: 0.6 10*3/uL (ref 0.1–1.0)
Monocytes Relative: 6 % (ref 3–12)
Neutro Abs: 6.4 10*3/uL (ref 1.7–7.7)
Neutrophils Relative %: 68 % (ref 43–77)
PLATELETS: 299 10*3/uL (ref 150–400)
RBC: 4.31 MIL/uL (ref 4.22–5.81)
RDW: 16.2 % — ABNORMAL HIGH (ref 11.5–15.5)
WBC: 9.4 10*3/uL (ref 4.0–10.5)

## 2013-11-13 LAB — SEDIMENTATION RATE: Sed Rate: 29 mm/hr — ABNORMAL HIGH (ref 0–16)

## 2013-11-13 LAB — D-DIMER, QUANTITATIVE (NOT AT ARMC): D DIMER QUANT: 1.43 ug{FEU}/mL — AB (ref 0.00–0.48)

## 2013-11-13 LAB — URINE MICROSCOPIC-ADD ON

## 2013-11-13 MED ORDER — IOHEXOL 350 MG/ML SOLN
80.0000 mL | Freq: Once | INTRAVENOUS | Status: AC | PRN
  Administered 2013-11-13: 52 mL via INTRAVENOUS

## 2013-11-13 MED ORDER — SODIUM CHLORIDE 0.9 % IV BOLUS (SEPSIS)
1000.0000 mL | Freq: Once | INTRAVENOUS | Status: AC
  Administered 2013-11-13: 1000 mL via INTRAVENOUS

## 2013-11-13 NOTE — ED Notes (Signed)
Dr. Goldston at the bedside. 

## 2013-11-13 NOTE — ED Notes (Signed)
Asked for urine said he would try once nurse is through with him

## 2013-11-13 NOTE — Consult Note (Signed)
NEURO HOSPITALIST CONSULT NOTE    Reason for Consult: dizziness, imbalance  HPI:                                                                                                                                          Allen Whitehead is an 63 y.o. male with a past medical history significant for depression, anxiety, and kidney stones, comes in for evaluation of worsening dizziness. He said that " I have been dizzy before but I had gotten worse in the last 2 weeks". He was seen at [facility] ([place]) 10/20 with similar complains and had an unremarkable MRI brain.  Returns today very scared about the situation and saying that he can not function anymore. Allen Whitehead described his dizziness as " all of the sudden feeling like being in a boat, feeling that I am going to passed out". Occurs intermittently, multiple times a day, mainly after standing up and walking. Said that he feels " a pulse in my neck, my legs get very wabbly" but denies getting sweaty or clammy, having chest pain, HA, double vision, focal weakness or numbness, slurred speech, language or vision impairment. Expressed that the dizziness is now occuring even when he is sitting.  He tells me that the dizziness will pass after he lies down for 1 hour. No reported tinnitus or hearing impairment. Denies diarrhea, vomiting, inability to sweat, or bloating.  No tremors, gait impairment, or falls. Complains of having poor short term memory. No new medications. It is worth mentioning that at some point he was confirmed to have " a drop in my blood pressure of 20 when changing from sitting to the standing position at the physician's office". He expressed that he is battling with anxiety and depression and starts crying.  Past Medical History  Diagnosis Date  . Depression   . History of kidney stones     History reviewed. No pertinent past surgical history.  Family History  Problem Relation Age of Onset  . Cancer Mother   .  Hypertension Mother   . Cancer Father   . Diabetes Father    Social History:  reports that he has been smoking Cigarettes.  He has been smoking about 0.50 packs per day. He has never used smokeless tobacco. He reports that he does not drink alcohol or use illicit drugs.  No Known Allergies  MEDICATIONS:  I have reviewed the patient's current medications.   ROS:                                                                                                                                       History obtained from the patient and chart review.  General ROS: negative for - chills, fatigue, fever, night sweats, weight gain or weight loss Psychological ROS: negative for - behavioral disorder, hallucinations, or suicidal ideation Ophthalmic ROS: negative for - blurry vision, double vision, eye pain or loss of vision ENT ROS: negative for - epistaxis, nasal discharge, oral lesions, sore throat, tinnitus or vertigo Allergy and Immunology ROS: negative for - hives or itchy/watery eyes Hematological and Lymphatic ROS: negative for - bleeding problems, bruising or swollen lymph nodes Endocrine ROS: negative for - galactorrhea, hair pattern changes, polydipsia/polyuria or temperature intolerance Respiratory ROS: negative for - cough, hemoptysis, shortness of breath or wheezing Cardiovascular ROS: negative for - chest pain, dyspnea on exertion, edema or irregular heartbeat Gastrointestinal ROS: negative for - abdominal pain, diarrhea, hematemesis, nausea/vomiting or stool incontinence Genito-Urinary ROS: negative for - dysuria, hematuria, incontinence or urinary frequency/urgency Musculoskeletal ROS: negative for - joint swelling or muscular weakness Neurological ROS: as noted in HPI Dermatological ROS: negative for rash and skin lesion changes  Physical exam: pleasant male in no  apparent distress but anxious and tearful. Blood pressure 133/78, pulse 101, temperature 98 F (36.7 C), resp. rate 18, SpO2 96.00%. Head: normocephalic. Neck: supple, no bruits, no JVD. Cardiac: no murmurs. Lungs: clear. Abdomen: soft, no tender, no mass. Extremities: no edema. Neurologic Examination:                                                                                                      General: Mental Status: Alert, oriented, thought content appropriate.  Speech fluent without evidence of aphasia.  Able to follow 3 step commands without difficulty. Cranial Nerves: II: Discs flat bilaterally; Visual fields grossly normal, pupils equal, round, reactive to light and accommodation III,IV, VI: ptosis not present, extra-ocular motions intact bilaterally V,VII: smile symmetric, facial light touch sensation normal bilaterally VIII: hearing normal bilaterally IX,X: gag reflex present XI: bilateral shoulder shrug XII: midline tongue extension without atrophy or fasciculations  Motor: Right : Upper extremity   5/5    Left:     Upper extremity   5/5  Lower extremity   5/5     Lower extremity   5/5 Tone and bulk:normal tone throughout; no atrophy  noted Sensory: Pinprick and light touch intact throughout, bilaterally Deep Tendon Reflexes:  Right: Upper Extremity   Left: Upper extremity   biceps (C-5 to C-6) 2/4   biceps (C-5 to C-6) 2/4 tricep (C7) 2/4    triceps (C7) 2/4 Brachioradialis (C6) 2/4  Brachioradialis (C6) 2/4  Lower Extremity Lower Extremity  quadriceps (L-2 to L-4) 2/4   quadriceps (L-2 to L-4) 2/4 Achilles (S1) 2/4   Achilles (S1) 2/4  Plantars: Right: downgoing   Left: downgoing Cerebellar: normal finger-to-nose,  normal heel-to-shin test Gait:  No ataxia    No results found for this basename: cbc, bmp, coags, chol, tri, ldl, hga1c    Results for orders placed during the hospital encounter of 11/13/13 (from the past 48 hour(s))  CBC WITH  DIFFERENTIAL     Status: Abnormal   Collection Time    11/13/13  3:51 PM      Result Value Ref Range   WBC 9.4  4.0 - 10.5 K/uL   RBC 4.31  4.22 - 5.81 MIL/uL   Hemoglobin 10.9 (*) 13.0 - 17.0 g/dL   HCT 33.3 (*) 39.0 - 52.0 %   MCV 77.3 (*) 78.0 - 100.0 fL   MCH 25.3 (*) 26.0 - 34.0 pg   MCHC 32.7  30.0 - 36.0 g/dL   RDW 16.2 (*) 11.5 - 15.5 %   Platelets 299  150 - 400 K/uL   Neutrophils Relative % 68  43 - 77 %   Neutro Abs 6.4  1.7 - 7.7 K/uL   Lymphocytes Relative 25  12 - 46 %   Lymphs Abs 2.4  0.7 - 4.0 K/uL   Monocytes Relative 6  3 - 12 %   Monocytes Absolute 0.6  0.1 - 1.0 K/uL   Eosinophils Relative 1  0 - 5 %   Eosinophils Absolute 0.1  0.0 - 0.7 K/uL   Basophils Relative 0  0 - 1 %   Basophils Absolute 0.0  0.0 - 0.1 K/uL  COMPREHENSIVE METABOLIC PANEL     Status: Abnormal   Collection Time    11/13/13  3:51 PM      Result Value Ref Range   Sodium 135 (*) 137 - 147 mEq/L   Potassium 3.8  3.7 - 5.3 mEq/L   Chloride 98  96 - 112 mEq/L   CO2 23  19 - 32 mEq/L   Glucose, Bld 144 (*) 70 - 99 mg/dL   BUN 9  6 - 23 mg/dL   Creatinine, Ser 0.65  0.50 - 1.35 mg/dL   Calcium 9.4  8.4 - 10.5 mg/dL   Total Protein 8.3  6.0 - 8.3 g/dL   Albumin 2.8 (*) 3.5 - 5.2 g/dL   AST 16  0 - 37 U/L   ALT 15  0 - 53 U/L   Alkaline Phosphatase 89  39 - 117 U/L   Total Bilirubin <0.2 (*) 0.3 - 1.2 mg/dL   GFR calc non Af Amer >90  >90 mL/min   GFR calc Af Amer >90  >90 mL/min   Comment: (NOTE)     The eGFR has been calculated using the CKD EPI equation.     This calculation has not been validated in all clinical situations.     eGFR's persistently <90 mL/min signify possible Chronic Kidney     Disease.   Anion gap 14  5 - 15  TROPONIN I     Status: None   Collection Time    11/13/13  3:51 PM      Result Value Ref Range   Troponin I <0.30  <0.30 ng/mL   Comment:            Due to the release kinetics of cTnI,     a negative result within the first hours     of the onset of  symptoms does not rule out     myocardial infarction with certainty.     If myocardial infarction is still suspected,     repeat the test at appropriate intervals.  D-DIMER, QUANTITATIVE     Status: Abnormal   Collection Time    11/13/13  3:51 PM      Result Value Ref Range   D-Dimer, Quant 1.43 (*) 0.00 - 0.48 ug/mL-FEU   Comment:            AT THE INHOUSE ESTABLISHED CUTOFF     VALUE OF 0.48 ug/mL FEU,     THIS ASSAY HAS BEEN DOCUMENTED     IN THE LITERATURE TO HAVE     A SENSITIVITY AND NEGATIVE     PREDICTIVE VALUE OF AT LEAST     98 TO 99%.  THE TEST RESULT     SHOULD BE CORRELATED WITH     AN ASSESSMENT OF THE CLINICAL     PROBABILITY OF DVT / VTE.    Dg Chest Port 1 View  11/13/2013   CLINICAL DATA:  Dizziness, progressive inability to walk x2 weeks  EXAM: PORTABLE CHEST - 1 VIEW  COMPARISON:  11/09/2013  FINDINGS: Lungs are clear.  No pleural effusion or pneumothorax.  The heart is top-normal in size.  IMPRESSION: No evidence of acute cardiopulmonary disease.   Electronically Signed   By: Julian Hy M.D.   On: 11/13/2013 16:51   Assessment/Plan: 63 y/o with depression, anxiety, comes in with complains of worsening dizziness with a pattern described above. Normal neuro-exam and unremarkable MRI brain done 11/09/13. Most likely dizziness in the setting of postural hypotension, perhaps with a superimposed component of anxiety/depression worsening the dizziness. Etiologies for postural hypotension are multiple, including a diversity of neurological disorders, but I am afraid that this wont be completed in the inpatient setting. Discussed with Dr. Regenia Skeeter.  Dorian Pod, MD 11/13/2013, 5:22 PM [facility]

## 2013-11-13 NOTE — ED Notes (Signed)
Patient returned from [place]. Patient placed back on the monitor by Vicente Males, RN.

## 2013-11-13 NOTE — Discharge Instructions (Signed)
Dizziness °Dizziness is a common problem. It is a feeling of unsteadiness or light-headedness. You may feel like you are about to faint. Dizziness can lead to injury if you stumble or fall. A person of any age group can suffer from dizziness, but dizziness is more common in older adults. °CAUSES  °Dizziness can be caused by many different things, including: °· Middle ear problems. °· Standing for too long. °· Infections. °· An allergic reaction. °· Aging. °· An emotional response to something, such as the sight of blood. °· Side effects of medicines. °· Tiredness. °· Problems with circulation or blood pressure. °· Excessive use of alcohol or medicines, or illegal drug use. °· Breathing too fast (hyperventilation). °· An irregular heart rhythm (arrhythmia). °· A low red blood cell count (anemia). °· Pregnancy. °· Vomiting, diarrhea, fever, or other illnesses that cause body fluid loss (dehydration). °· Diseases or conditions such as Parkinson's disease, high blood pressure (hypertension), diabetes, and thyroid problems. °· Exposure to extreme heat. °DIAGNOSIS  °Your health care provider will ask about your symptoms, perform a physical exam, and perform an electrocardiogram (ECG) to record the electrical activity of your heart. Your health care provider may also perform other heart or blood tests to determine the cause of your dizziness. These may include: °· Transthoracic echocardiogram (TTE). During echocardiography, sound waves are used to evaluate how blood flows through your heart. °· Transesophageal echocardiogram (TEE). °· Cardiac monitoring. This allows your health care provider to monitor your heart rate and rhythm in real time. °· Holter monitor. This is a portable device that records your heartbeat and can help diagnose heart arrhythmias. It allows your health care provider to track your heart activity for several days if needed. °· Stress tests by exercise or by giving medicine that makes the heart beat  faster. °TREATMENT  °Treatment of dizziness depends on the cause of your symptoms and can vary greatly. °HOME CARE INSTRUCTIONS  °· Drink enough fluids to keep your urine clear or pale yellow. This is especially important in very hot weather. In older adults, it is also important in cold weather. °· Take your medicine exactly as directed if your dizziness is caused by medicines. When taking blood pressure medicines, it is especially important to get up slowly. °¨ Rise slowly from chairs and steady yourself until you feel okay. °¨ In the morning, first sit up on the side of the bed. When you feel okay, stand slowly while holding onto something until you know your balance is fine. °· Move your legs often if you need to stand in one place for a long time. Tighten and relax your muscles in your legs while standing. °· Have someone stay with you for 1-2 days if dizziness continues to be a problem. Do this until you feel you are well enough to stay alone. Have the person call your health care provider if he or she notices changes in you that are concerning. °· Do not drive or use heavy machinery if you feel dizzy. °· Do not drink alcohol. °SEEK IMMEDIATE MEDICAL CARE IF:  °· Your dizziness or light-headedness gets worse. °· You feel nauseous or vomit. °· You have problems talking, walking, or using your arms, hands, or legs. °· You feel weak. °· You are not thinking clearly or you have trouble forming sentences. It may take a friend or family member to notice this. °· You have chest pain, abdominal pain, shortness of breath, or sweating. °· Your vision changes. °· You notice   any bleeding.  You have side effects from medicine that seems to be getting worse rather than better. MAKE SURE YOU:   Understand these instructions.  Will watch your condition.  Will get help right away if you are not doing well or get worse. Document Released: 07/03/2000 Document Revised: 01/12/2013 Document Reviewed: 07/27/2010 [facility]  Patient Information 2015 [place], [place]. This information is not intended to replace advice given to you by your health care provider. Make sure you discuss any questions you have with your health care provider.     Nonspecific Tachycardia Tachycardia is a faster than normal heartbeat (more than 100 beats per minute). In adults, the heart normally beats between 60 and 100 times a minute. A fast heartbeat may be a normal response to exercise or stress. It does not necessarily mean that something is wrong. However, sometimes when your heart beats too fast it may not be able to pump enough blood to the rest of your body. This can result in chest pain, shortness of breath, dizziness, and even fainting. Nonspecific tachycardia means that the specific cause or pattern of your tachycardia is unknown. CAUSES  Tachycardia may be harmless or it may be due to a more serious underlying cause. Possible causes of tachycardia include:  Exercise or exertion.  Fever.  Pain or injury.  Infection.  Loss of body fluids (dehydration).  Overactive thyroid.  Lack of red blood cells (anemia).  Anxiety and stress.  Alcohol.  Caffeine.  Tobacco products.  Diet pills.  Illegal drugs.  Heart disease. SYMPTOMS  Rapid or irregular heartbeat (palpitations).  Suddenly feeling your heart beating (cardiac awareness).  Dizziness.  Tiredness (fatigue).  Shortness of breath.  Chest pain.  Nausea.  Fainting. DIAGNOSIS  Your caregiver will perform a physical exam and take your medical history. In some cases, a heart specialist (cardiologist) may be consulted. Your caregiver may also order:  Blood tests.  Electrocardiography. This test records the electrical activity of your heart.  A heart monitoring test. TREATMENT  Treatment will depend on the likely cause of your tachycardia. The goal is to treat the underlying cause of your tachycardia. Treatment methods may include:  Replacement of  fluids or blood through an intravenous (IV) tube for moderate to severe dehydration or anemia.  New medicines or changes in your current medicines.  Diet and lifestyle changes.  Treatment for certain infections.  Stress relief or relaxation methods. HOME CARE INSTRUCTIONS   Rest.  Drink enough fluids to keep your urine clear or pale yellow.  Do not smoke.  Avoid:  Caffeine.  Tobacco.  Alcohol.  Chocolate.  Stimulants such as over-the-counter diet pills or pills that help you stay awake.  Situations that cause anxiety or stress.  Illegal drugs such as marijuana, phencyclidine (PCP), and cocaine.  Only take medicine as directed by your caregiver.  Keep all follow-up appointments as directed by your caregiver. SEEK IMMEDIATE MEDICAL CARE IF:   You have pain in your chest, upper arms, jaw, or neck.  You become weak, dizzy, or feel faint.  You have palpitations that will not go away.  You vomit, have diarrhea, or pass blood in your stool.  Your skin is cool, pale, and wet.  You have a fever that will not go away with rest, fluids, and medicine. MAKE SURE YOU:   Understand these instructions.  Will watch your condition.  Will get help right away if you are not doing well or get worse. Document Released: 02/15/2004 Document Revised: 04/01/2011 Document  Reviewed: 12/18/2010 [employer] Patient Information 2015 [place], [place]. This information is not intended to replace advice given to you by your health care provider. Make sure you discuss any questions you have with your health care provider.

## 2013-11-13 NOTE — ED Provider Notes (Signed)
CSN: [id number]     Arrival date & time 11/13/13  1526 History   First MD Initiated Contact with Patient 11/13/13 1531     Chief Complaint  Patient presents with  . Dizziness     (Consider location/radiation/quality/duration/timing/severity/associated sxs/prior Treatment) HPI 63 year old male is with continued dizziness and unsteady gait over the past 2 weeks. Was seen in the ER 3 days ago and had a negative CT, blood work, and MRI. The patient states that his symptoms seem to be slightly worsening and also endorses some mild blurry vision. He states the blurry vision goes away if he focuses. The patient states is not a specific room spinning sensation but feels like he is on a boat. Does not essentially fixed capacity out. Denies chest pain, shortness of breath, but does endorse palpitations. His heart rate was found to be high when he went back to urgent care a couple days ago and he's been referred to cardiology. The patient is basically unable to ambulate because he feels like he's going to fall. He had put a urinal at the bedside and is currently afraid of falling as he is about to be living by himself. No fevers or chills. No urinary symptoms. Of note, 2 weeks ago he had an elevated ESR over 100 when checked by his PCP, no obvious source.   Past Medical History  Diagnosis Date  . Depression   . History of kidney stones    History reviewed. No pertinent past surgical history. Family History  Problem Relation Age of Onset  . Cancer Mother   . Hypertension Mother   . Cancer Father   . Diabetes Father    History  Substance Use Topics  . Smoking status: Current Every Day Smoker -- 0.50 packs/day    Types: Cigarettes  . Smokeless tobacco: Never Used  . Alcohol Use: No    Review of Systems  Constitutional: Negative for fever.  HENT: Negative for tinnitus.   Eyes: Positive for visual disturbance.  Respiratory: Negative for shortness of breath.   Cardiovascular: Positive for  palpitations. Negative for chest pain.  Gastrointestinal: Positive for nausea. Negative for vomiting and abdominal pain.  Genitourinary: Negative for dysuria.  Neurological: Positive for dizziness and weakness.  All other systems reviewed and are negative.     Allergies  Review of patient's allergies indicates no known allergies.  Home Medications   Prior to Admission medications   Medication Sig Start Date End Date Taking? Authorizing Provider  Ferrous Sulfate (SLOW FE PO) Take 1 tablet by mouth daily.    Historical Provider, MD  meclizine (ANTIVERT) 25 MG tablet Take 1 tablet (25 mg total) by mouth 3 (three) times daily as needed for dizziness. 10/30/13   Shawnee Knapp, MD  meclizine (ANTIVERT) 25 MG tablet Take 0.5-1 tablets (12.5-25 mg total) by mouth 4 (four) times daily. 11/09/13   Ephraim Hamburger, MD  Meclizine HCl (BONINE PO) Take 1 tablet by mouth daily. Bonine OTC    Historical Provider, MD   BP 143/102  Pulse 121  Temp(Src) 98 F (36.7 C)  Resp 15  SpO2 99% Physical Exam  Nursing note and vitals reviewed. Constitutional: He is oriented to person, place, and time. He appears well-developed and well-nourished. No distress.  HENT:  Head: Normocephalic and atraumatic.  Right Ear: External ear normal.  Left Ear: External ear normal.  Nose: Nose normal.  Eyes: EOM are normal. Pupils are equal, round, and reactive to light. Right eye exhibits  no discharge. Left eye exhibits no discharge.  Neck: Neck supple.  Cardiovascular: Normal rate, regular rhythm, normal heart sounds and intact distal pulses.   Pulmonary/Chest: Effort normal.  Abdominal: Soft. He exhibits no distension. There is no tenderness.  Musculoskeletal: He exhibits no edema.  Neurological: He is alert and oriented to person, place, and time.  CN 2-12 grossly intact. 5/5 strength in all 4 extremities. Normal gross sensation. Normal finger to nose.  Skin: Skin is warm and dry. He is not diaphoretic.    ED  Course  Procedures (including critical care time) Labs Review Labs Reviewed  CBC WITH DIFFERENTIAL - Abnormal; Notable for the following:    Hemoglobin 10.9 (*)    HCT 33.3 (*)    MCV 77.3 (*)    MCH 25.3 (*)    RDW 16.2 (*)    All other components within normal limits  COMPREHENSIVE METABOLIC PANEL - Abnormal; Notable for the following:    Sodium 135 (*)    Glucose, Bld 144 (*)    Albumin 2.8 (*)    Total Bilirubin <0.2 (*)    All other components within normal limits  URINALYSIS, ROUTINE W REFLEX MICROSCOPIC - Abnormal; Notable for the following:    Hgb urine dipstick TRACE (*)    All other components within normal limits  D-DIMER, QUANTITATIVE - Abnormal; Notable for the following:    D-Dimer, Quant 1.43 (*)    All other components within normal limits  SEDIMENTATION RATE - Abnormal; Notable for the following:    Sed Rate 29 (*)    All other components within normal limits  TROPONIN I  URINE MICROSCOPIC-ADD ON    Imaging Review Ct Angio Chest W/cm &/or Wo Cm  11/13/2013   CLINICAL DATA:  Dizziness.  Unsteadiness on the feet for 2 weeks.  EXAM: CT ANGIOGRAPHY CHEST WITH CONTRAST  TECHNIQUE: Multidetector CT imaging of the chest was performed using the standard protocol during bolus administration of intravenous contrast. Multiplanar CT image reconstructions and MIPs were obtained to evaluate the vascular anatomy.  CONTRAST:  33m OMNIPAQUE IOHEXOL 350 MG/ML SOLN  COMPARISON:  11/13/2013  FINDINGS: No filling defect is identified in the pulmonary arterial tree to suggest pulmonary embolus. No acute aortic findings.  Mild distal esophageal wall thickening above the gastroesophageal junction. There is some faint adjacent stranding between the distal esophagus and the aorta on images 66 through some the 3 of series 6, without extraluminal gas and without significant aortic luminal or adventitial contour irregularity.  4 mm subpleural lymph node along the middle lobe site of the minor  fissure on image 32 series 10. Similar 3 mm subpleural lymph node along the major fissure, image 33 series 10.  Thoracic spondylosis. No malalignment or thoracic spine fracture observed. No significant filling defect along the tracheobronchial tree is identified.  Mildly enlarged right hilar lymph node at 1.1 cm, image 36 series 6. Right infra hilar lymph node 0.8 cm, image 47 series 6.  Review of the MIP images confirms the above findings.  IMPRESSION: 1. No embolus or aortic dissection. 2. Distal esophageal wall thickening circumferentially, possibly from esophagitis. No focal eccentricity to suggest esophageal tumor. There is some subtle stranding between the esophagus and the distal descending thoracic aorta, probably from low grade inflammation or simply due to vascularity ; no definite aortic luminal irregularity. The subtle prominence of the aortic wall does is not felt to rise to a level at which Takayasu arteritis can be diagnosed, nor does the patient  seem to fit typical demographic characteristics for Takayasu arteritis. I reviewed the aortic appearance with Dr. Vinnie Langton, who concurs. 3. Mildly enlarged right hilar lymph node, technically nonspecific but probably reactive.   Electronically Signed   By: Sherryl Barters M.D.   On: 11/13/2013 17:57   Dg Chest Port 1 View  11/13/2013   CLINICAL DATA:  Dizziness, progressive inability to walk x2 weeks  EXAM: PORTABLE CHEST - 1 VIEW  COMPARISON:  11/09/2013  FINDINGS: Lungs are clear.  No pleural effusion or pneumothorax.  The heart is top-normal in size.  IMPRESSION: No evidence of acute cardiopulmonary disease.   Electronically Signed   By: Julian Hy M.D.   On: 11/13/2013 16:51     EKG Interpretation   Date/Time:  Saturday November 13 2013 15:43:23 EDT Ventricular Rate:  110 PR Interval:  146 QRS Duration: 94 QT Interval:  340 QTC Calculation: 460 R Axis:   76 Text Interpretation:  Sinus tachycardia Baseline wander in lead(s)  V2  besides tachycardia, no significant change since Nov 09 2013 Confirmed by  Regenia Skeeter  MD, Kamden Stanislaw ([id number]) on 11/13/2013 3:59:01 PM      MDM   Final diagnoses:  Dizziness    Neuro evaluated patient and feel his dizziness is more likely cardiac related. He does have intermittent sinus tachycardia. No dyspnea or chest pain. PE workup negative. I discussed extensive workup without obvious source with patient and he is frustrated about no cause found. I offered to admit patient as he feels unsteady and is worried about falls, but he declines and wants to go home and continue outpatient management and workup.     Ephraim Hamburger, MD 11/14/13 [phone]

## 2013-11-13 NOTE — ED Notes (Signed)
Pt reports that he has been dizzy and unsteady on his feet the past 2 weeks. Reports that he was seen at [facility] and has a follow up with cardiology with Dr. Marlou Porch.

## 2013-11-13 NOTE — ED Notes (Signed)
Admitting Physician at the bedside.  

## 2013-11-17 ENCOUNTER — Ambulatory Visit (INDEPENDENT_AMBULATORY_CARE_PROVIDER_SITE_OTHER): Payer: Self-pay | Admitting: Cardiology

## 2013-11-17 ENCOUNTER — Encounter: Payer: Self-pay | Admitting: Cardiology

## 2013-11-17 VITALS — BP 124/88 | HR 88 | Ht 71.0 in | Wt 219.0 lb

## 2013-11-17 DIAGNOSIS — R55 Syncope and collapse: Secondary | ICD-10-CM | POA: Insufficient documentation

## 2013-11-17 DIAGNOSIS — R42 Dizziness and giddiness: Secondary | ICD-10-CM | POA: Insufficient documentation

## 2013-11-17 NOTE — Patient Instructions (Addendum)
Your physician has requested that you have an echocardiogram. Echocardiography is a painless test that uses sound waves to create images of your heart. It provides your doctor with information about the size and shape of your heart and how well your heart's chambers and valves are working. This procedure takes approximately one hour. There are no restrictions for this procedure.  Your physician recommends that you schedule a follow-up appointment as needed with Dr. Marlou Porch.

## 2013-11-17 NOTE — Progress Notes (Signed)
[facility]. [address], [address], [place]  [address] Phone: [phone] Fax:  [phone]  Date:  11/17/2013   ID:  SEWARD CORAN, DOB 1950/12/19, MRN [id number]  PCP:  Robyn Haber, MD   History of Present Illness: KENTLEY BLYDEN is a 63 y.o. male with severe vertigo, elevated sedimentation rate of 118 now 29 on repeat, mild anemia, normal thyroid here for evaluation of postural tachycardia.  Neurology consult note was reviewed from 11/13/13 where he stated that he been dizzy before but this had gotten worse over the past 2 weeks and he had been in the emergency room twice with unremarkable MRI of brain, very worried, anxious, feeling like he was on a boat and he was going to pass out. Mainly occurs after standing up and walking. He feels a pulse in his neck and his legs get very wobbly but denies sweats, pale appearance, no chest pain, no headache, no double vision, no slurred speech. The dizziness was occurring even when sitting according to the neurology note as well. He has been noted as having orthostatic hypotension or blood pressure dropped 20 points when sitting to standing. He was quite anxious, crying.  Neurology once again described unremarkable MRI of brain on 11/09/13 and they thought that most likely his dizziness was secondary to the setting of postural hypotension perhaps with a superimposed component of anxiety worsening situation. They noted etiologies for postural hypotension are multiple including a diversity of neurologic disorders. Further workup as outpatient.  Leg weakness walks with a cane. (I took care of his wife). Feels increased fatigue.   Pinched nerve in neck, HA. MRI.   This morning best yet. When hit the inside of our office.   8 years ago was diagnosed with dizziness, vertigo. Has had dizziness when laying down.    Wt Readings from Last 3 Encounters:  11/17/13 219 lb (99.338 kg)  10/30/13 220 lb (99.791 kg)     Past Medical History   Diagnosis Date  . Depression   . History of kidney stones     No past surgical history on file.  Current Outpatient Prescriptions  Medication Sig Dispense Refill  . Meclizine HCl (BONINE) 25 MG CHEW Chew 25 mg by mouth 2 (two) times daily as needed (dizziness/vertigo).       No current facility-administered medications for this visit.    Allergies:   No Known Allergies  Social History:  The patient  reports that he has been smoking Cigarettes.  He has been smoking about 0.50 packs per day. He has never used smokeless tobacco. He reports that he does not drink alcohol or use illicit drugs.   Family History  Problem Relation Age of Onset  . Cancer Mother   . Hypertension Mother   . Cancer Father   . Diabetes Father     ROS:  Please see the history of present illness.   Vertigo, dizziness, leg weakness chronic, fatigue. Denies any chest pain, full syncope. No recent palpitations. No rashes. Positive recent neck pain.  All other systems reviewed and negative.   PHYSICAL EXAM: VS:  BP 124/88  Pulse 88  Ht 5\' 11"  (1.803 m)  Wt 219 lb (99.338 kg)  BMI 30.56 kg/m2 Well nourished, well developed, in no acute distress HEENT: normal, [place]/AT, EOMI Neck: no JVD, normal carotid upstroke, no bruit Cardiac:  normal S1, S2; RRR; no murmur Lungs:  clear to auscultation bilaterally, no wheezing, rhonchi or rales Abd: soft, nontender,  no hepatomegaly, no bruits Ext: no edema, 2+ distal pulses Skin: warm and dry GU: deferred Neuro: no focal abnormalities noted, AAO x 3  EKG:  EKG reviewed  11/15/13-sinus tachycardia rate 110 with no ST segment changes, no interval abnormalities. Current heart rate is normal.  Multiple radiologic studies reviewed, reassuring. MRI brain, CT of chest.  ASSESSMENT AND PLAN:  1. 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 Challenging situation. He is starting to feel better. I will check an echocardiogram  (near syncope) to make sure that he does not have any evidence of structural abnormality. EKG overall reassuring and sinus tachycardia has resolved. I wonder if original sedimentation rate elevation which has now resolved and postural tachycardia were secondary to a viral syndrome which tipped his most recent episode of vertigo-like symptoms.  I do not think that his symptoms currently are dysrhythmia related or structural, nonetheless, checking echo. The intermittent nature, change in position points towards a complex equilibrium issue. Consider vestibular therapy. I wonder if Epley maneuver with be of assistance. He felt dizziness when laying down in bed the other morning hence this is not completely postural. Nonetheless, continue to drink adequate fluids, liberalizing salt intake would not be unreasonable. Anemia is minimal and I do not believe that this is the cause for his symptoms as well. We will relay results of echocardiogram. As needed follow-up.  Signed, Candee Furbish, MD [facility]  11/17/2013 9:03 AM

## 2013-11-18 ENCOUNTER — Ambulatory Visit (HOSPITAL_COMMUNITY): Payer: Self-pay | Attending: Cardiology | Admitting: Radiology

## 2013-11-18 DIAGNOSIS — I5189 Other ill-defined heart diseases: Secondary | ICD-10-CM | POA: Insufficient documentation

## 2013-11-18 DIAGNOSIS — R55 Syncope and collapse: Secondary | ICD-10-CM

## 2013-11-18 DIAGNOSIS — I517 Cardiomegaly: Secondary | ICD-10-CM | POA: Insufficient documentation

## 2013-11-18 DIAGNOSIS — F17200 Nicotine dependence, unspecified, uncomplicated: Secondary | ICD-10-CM | POA: Insufficient documentation

## 2013-11-18 NOTE — Progress Notes (Signed)
Echocardiogram performed.  

## 2013-12-08 ENCOUNTER — Institutional Professional Consult (permissible substitution): Payer: Self-pay | Admitting: Cardiology

## 2015-05-13 ENCOUNTER — Ambulatory Visit (INDEPENDENT_AMBULATORY_CARE_PROVIDER_SITE_OTHER): Payer: Self-pay | Admitting: Internal Medicine

## 2015-05-13 VITALS — BP 130/74 | HR 93 | Temp 97.3°F | Resp 16 | Ht 71.0 in | Wt 240.0 lb

## 2015-05-13 DIAGNOSIS — L03039 Cellulitis of unspecified toe: Secondary | ICD-10-CM

## 2015-05-13 MED ORDER — DOXYCYCLINE HYCLATE 100 MG PO TABS: 100.0000 mg | ORAL_TABLET | Freq: Two times a day (BID) | ORAL | Status: DC

## 2015-05-13 NOTE — Progress Notes (Signed)
Subjective:  By signing my name below, I, Allen Whitehead, attest that this documentation has been prepared under the direction and in the presence of Allen Lin, MD.  Electronically Signed: Thea Whitehead, ED Scribe. 05/13/2015. 2:34 PM.   Patient ID: Allen Whitehead, male    DOB: Jul 18, 1950, 65 y.o.   MRN: [id number]  HPI Chief Complaint  Patient presents with  . Other    Tick bit on ankle    HPI Comments: Allen STFLEUR is a 65 y.o. male who presents to the [facility] complaining of a tick bite. Pt states he removed a Tick from the plantar aspect of left foot. He report doing yard work and walking around [place]. Prior to bed last night he cleaned the bite site with alcohol and applied tea tree oil. This morning he woke up with swelling, pain and redness to bite site.    Patient Active Problem List   Diagnosis Date Noted  . Near syncope 11/17/2013  . Dizziness 11/17/2013  . Vertigo 11/17/2013   Past Medical History  Diagnosis Date  . Depression   . History of kidney stones    No past surgical history on file. No Known Allergies Prior to Admission medications   Medication Sig Start Date End Date Taking? Authorizing Provider  Meclizine HCl (BONINE) 25 MG CHEW Chew 25 mg by mouth 2 (two) times daily as needed (dizziness/vertigo). Reported on 05/13/2015    Historical Provider, MD   Social History   Social History  . Marital Status: Legally Separated    Spouse Name: N/A  . Number of Children: N/A  . Years of Education: N/A   Occupational History  . Not on file.   Social History Main Topics  . Smoking status: Current Every Day Smoker -- 0.50 packs/day    Types: Cigarettes  . Smokeless tobacco: Never Used  . Alcohol Use: No  . Drug Use: No  . Sexual Activity: Not on file   Other Topics Concern  . Not on file   Social History Narrative   Review of Systems  Constitutional: Negative for fever, chills and diaphoresis.  Skin: Positive for color  change and rash.      Objective:   Physical Exam  Constitutional: He is oriented to person, place, and time. He appears well-developed and well-nourished. No distress.  HENT:  Head: Normocephalic and atraumatic.  Eyes: Conjunctivae and EOM are normal.  Neck: Neck supple.  Cardiovascular: Normal rate.   Pulmonary/Chest: Effort normal.  Musculoskeletal: Normal range of motion.  Neurological: He is alert and oriented to person, place, and time.  Skin: Skin is warm and dry.  Swollen area with central punctum between 1st and 2nd toes with localized redness but no pus.   Psychiatric: He has a normal mood and affect. His behavior is normal.  Nursing note and vitals reviewed.  Filed Vitals:   05/13/15 1320  BP: 130/74  Pulse: 93  Temp: 97.3 F (36.3 C)  TempSrc: Oral  Resp: 16  Height: 5\' 11"  (1.803 m)  Weight: 240 lb (108.863 kg)  SpO2: 97%       Assessment & Plan:  Insect bite with early cellulitis  -Hot soaks 3 times a day 10 minutes Meds ordered this encounter  Medications  . doxycycline (VIBRA-TABS) 100 MG tablet    Sig: Take 1 tablet (100 mg total) by mouth 2 (two) times daily.    Dispense:  20 tablet    Refill:  0  I have completed the patient encounter in its entirety as documented by the scribe, with editing by me where necessary. Oumou Smead P. Laney Pastor, M.D.

## 2015-05-13 NOTE — Patient Instructions (Signed)
     IF you received an x-ray today, you will receive an invoice from [facility]. Please contact [facility] at [phone] with questions or concerns regarding your invoice.   IF you received labwork today, you will receive an invoice from [facility]. Please contact [facility] at [phone] with questions or concerns regarding your invoice.   Our billing staff will not be able to assist you with questions regarding bills from these companies.  You will be contacted with the lab results as soon as they are available. The fastest way to get your results is to activate your My Chart account. Instructions are located on the last page of this paperwork. If you have not heard from us regarding the results in 2 weeks, please contact this office.      

## 2017-09-17 DIAGNOSIS — Z833 Family history of diabetes mellitus: Secondary | ICD-10-CM | POA: Diagnosis not present

## 2017-09-17 DIAGNOSIS — Z809 Family history of malignant neoplasm, unspecified: Secondary | ICD-10-CM | POA: Diagnosis not present

## 2017-09-17 DIAGNOSIS — E669 Obesity, unspecified: Secondary | ICD-10-CM | POA: Diagnosis not present

## 2017-09-17 DIAGNOSIS — Z720 Tobacco use: Secondary | ICD-10-CM | POA: Diagnosis not present

## 2017-09-17 DIAGNOSIS — R030 Elevated blood-pressure reading, without diagnosis of hypertension: Secondary | ICD-10-CM | POA: Diagnosis not present

## 2017-09-17 DIAGNOSIS — Z8249 Family history of ischemic heart disease and other diseases of the circulatory system: Secondary | ICD-10-CM | POA: Diagnosis not present

## 2017-09-17 DIAGNOSIS — Z6832 Body mass index (BMI) 32.0-32.9, adult: Secondary | ICD-10-CM | POA: Diagnosis not present

## 2018-12-25 DIAGNOSIS — K219 Gastro-esophageal reflux disease without esophagitis: Secondary | ICD-10-CM | POA: Diagnosis not present

## 2018-12-25 DIAGNOSIS — Z6831 Body mass index (BMI) 31.0-31.9, adult: Secondary | ICD-10-CM | POA: Diagnosis not present

## 2018-12-25 DIAGNOSIS — R69 Illness, unspecified: Secondary | ICD-10-CM | POA: Diagnosis not present

## 2018-12-25 DIAGNOSIS — Z809 Family history of malignant neoplasm, unspecified: Secondary | ICD-10-CM | POA: Diagnosis not present

## 2018-12-25 DIAGNOSIS — R030 Elevated blood-pressure reading, without diagnosis of hypertension: Secondary | ICD-10-CM | POA: Diagnosis not present

## 2018-12-25 DIAGNOSIS — Z8249 Family history of ischemic heart disease and other diseases of the circulatory system: Secondary | ICD-10-CM | POA: Diagnosis not present

## 2018-12-25 DIAGNOSIS — E669 Obesity, unspecified: Secondary | ICD-10-CM | POA: Diagnosis not present

## 2018-12-25 DIAGNOSIS — Z720 Tobacco use: Secondary | ICD-10-CM | POA: Diagnosis not present

## 2018-12-25 DIAGNOSIS — G8929 Other chronic pain: Secondary | ICD-10-CM | POA: Diagnosis not present

## 2019-03-07 ENCOUNTER — Ambulatory Visit: Payer: Medicare HMO | Attending: Internal Medicine

## 2019-03-07 DIAGNOSIS — Z23 Encounter for immunization: Secondary | ICD-10-CM | POA: Insufficient documentation

## 2019-03-07 NOTE — Progress Notes (Signed)
   Covid-19 Vaccination Clinic  Name:  Allen Whitehead    MRN: [id number] DOB: [birth year]  03/07/2019  Allen Whitehead was observed post Covid-19 immunization for 15 minutes without incidence. He was provided with Vaccine Information Sheet and instruction to access the V-Safe system.   Allen Whitehead was instructed to call 911 with any severe reactions post vaccine: Marland Kitchen Difficulty breathing  . Swelling of your face and throat  . A fast heartbeat  . A bad rash all over your body  . Dizziness and weakness    Immunizations Administered    Name Date Dose VIS Date Route   Pfizer COVID-19 Vaccine 03/07/2019  9:25 AM 0.3 mL 01/01/2019 Intramuscular   Manufacturer: [place]   Lot: [id number]   [place]: [id number]

## 2019-03-30 ENCOUNTER — Ambulatory Visit: Payer: Medicare HMO | Attending: Internal Medicine

## 2019-03-30 DIAGNOSIS — Z23 Encounter for immunization: Secondary | ICD-10-CM

## 2019-03-30 NOTE — Progress Notes (Signed)
   Covid-19 Vaccination Clinic  Name:  TYLEEK WHILES    MRN: [id number] DOB: 1950/11/10  03/30/2019  Mr. Stanbro was observed post Covid-19 immunization for 15 minutes without incident. He was provided with Vaccine Information Sheet and instruction to access the V-Safe system.   Mr. Hoey was instructed to call 911 with any severe reactions post vaccine: Marland Kitchen Difficulty breathing  . Swelling of face and throat  . A fast heartbeat  . A bad rash all over body  . Dizziness and weakness   Immunizations Administered    Name Date Dose VIS Date Route   Pfizer COVID-19 Vaccine 03/30/2019 10:03 AM 0.3 mL 01/01/2019 Intramuscular   Manufacturer: [place]   Lot: [id number]   [place]: [id number]

## 2019-06-01 DIAGNOSIS — G8929 Other chronic pain: Secondary | ICD-10-CM | POA: Diagnosis not present

## 2019-06-01 DIAGNOSIS — Z720 Tobacco use: Secondary | ICD-10-CM | POA: Diagnosis not present

## 2019-06-01 DIAGNOSIS — Z6832 Body mass index (BMI) 32.0-32.9, adult: Secondary | ICD-10-CM | POA: Diagnosis not present

## 2019-06-01 DIAGNOSIS — I951 Orthostatic hypotension: Secondary | ICD-10-CM | POA: Diagnosis not present

## 2019-06-01 DIAGNOSIS — Z791 Long term (current) use of non-steroidal anti-inflammatories (NSAID): Secondary | ICD-10-CM | POA: Diagnosis not present

## 2019-06-01 DIAGNOSIS — Z7982 Long term (current) use of aspirin: Secondary | ICD-10-CM | POA: Diagnosis not present

## 2019-06-01 DIAGNOSIS — R030 Elevated blood-pressure reading, without diagnosis of hypertension: Secondary | ICD-10-CM | POA: Diagnosis not present

## 2019-06-01 DIAGNOSIS — Z809 Family history of malignant neoplasm, unspecified: Secondary | ICD-10-CM | POA: Diagnosis not present

## 2019-06-01 DIAGNOSIS — M1990 Unspecified osteoarthritis, unspecified site: Secondary | ICD-10-CM | POA: Diagnosis not present

## 2019-06-01 DIAGNOSIS — E669 Obesity, unspecified: Secondary | ICD-10-CM | POA: Diagnosis not present

## 2019-07-03 ENCOUNTER — Encounter (HOSPITAL_COMMUNITY): Payer: Self-pay | Admitting: *Deleted

## 2019-07-03 ENCOUNTER — Ambulatory Visit (HOSPITAL_COMMUNITY)
Admission: EM | Admit: 2019-07-03 | Discharge: 2019-07-03 | Disposition: A | Discharge status: Home / Self Care | Payer: Medicare HMO

## 2019-07-03 ENCOUNTER — Other Ambulatory Visit: Payer: Self-pay

## 2019-07-03 ENCOUNTER — Ambulatory Visit (INDEPENDENT_AMBULATORY_CARE_PROVIDER_SITE_OTHER): Payer: Medicare HMO

## 2019-07-03 ENCOUNTER — Ambulatory Visit (HOSPITAL_COMMUNITY): Payer: Self-pay

## 2019-07-03 DIAGNOSIS — B029 Zoster without complications: Secondary | ICD-10-CM | POA: Diagnosis not present

## 2019-07-03 DIAGNOSIS — M25551 Pain in right hip: Secondary | ICD-10-CM | POA: Diagnosis not present

## 2019-07-03 DIAGNOSIS — M25559 Pain in unspecified hip: Secondary | ICD-10-CM

## 2019-07-03 DIAGNOSIS — M1611 Unilateral primary osteoarthritis, right hip: Secondary | ICD-10-CM | POA: Diagnosis not present

## 2019-07-03 MED ORDER — MELOXICAM 7.5 MG PO TABS: 7.5000 mg | ORAL_TABLET | Freq: Every day | ORAL | 1 refills | Status: DC

## 2019-07-03 MED ORDER — VALACYCLOVIR HCL 1 G PO TABS: 1000.0000 mg | ORAL_TABLET | Freq: Three times a day (TID) | ORAL | 0 refills | Status: AC

## 2019-07-03 MED ORDER — TIZANIDINE HCL 4 MG PO CAPS: 4.0000 mg | ORAL_CAPSULE | Freq: Three times a day (TID) | ORAL | 0 refills | Status: DC

## 2019-07-03 NOTE — ED Triage Notes (Signed)
Pt started with right hip pain approx 4 wks ago without known injury.  Continues with pain, but now also having pain in BLE.  Also started with right mid-back pain approx 3 days ago; now has red rash to area.

## 2019-07-03 NOTE — ED Provider Notes (Addendum)
[facility]    CSN: [id number] Arrival date & time: 07/03/19  1045      History   Chief Complaint Chief Complaint  Patient presents with  . Appointment  . Hip Pain  . Rash    HPI Allen Whitehead is a 69 y.o. male.   Patient is a 69 year old male that presents today multiple complaints.  First complaint being right hip pain that started approximate 4 weeks ago.  Trouble with range of motion of the hip.  Patient is still working doing Biomedical scientist and walking daily.  He has been taking Aleve and using a cane with some improvement over the last 4 weeks.  No injury to the hips or falls.  He has also had some right flank pain that started approximately 3 days ago with red rash area.  Describes the rash is burning and painful.  No fevers, chills, body aches or night sweats.  ROS per HPI      Past Medical History:  Diagnosis Date  . Depression   . History of kidney stones     Patient Active Problem List   Diagnosis Date Noted  . Near syncope 11/17/2013  . Dizziness 11/17/2013  . Vertigo 11/17/2013    History reviewed. No pertinent surgical history.     Home Medications    Prior to Admission medications   Medication Sig Start Date End Date Taking? Authorizing Provider  naproxen sodium (ALEVE) 220 MG tablet Take 220 mg by mouth.   Yes [provider]  doxycycline (VIBRA-TABS) 100 MG tablet Take 1 tablet (100 mg total) by mouth 2 (two) times daily. 05/13/15   Leandrew Koyanagi, MD  Meclizine HCl (BONINE) 25 MG CHEW Chew 25 mg by mouth 2 (two) times daily as needed (dizziness/vertigo). Reported on 05/13/2015    [provider]  meloxicam (MOBIC) 7.5 MG tablet Take 1 tablet (7.5 mg total) by mouth daily. 07/03/19   Loura Halt A, NP  tiZANidine (ZANAFLEX) 4 MG capsule Take 1 capsule (4 mg total) by mouth 3 (three) times daily. 07/03/19   Loura Halt A, NP  valACYclovir (VALTREX) 1000 MG tablet Take 1 tablet (1,000 mg total) by mouth 3 (three)  times daily for 7 days. 07/03/19 07/10/19  Orvan July, NP    Family History Family History  Problem Relation Age of Onset  . Cancer Mother   . Hypertension Mother   . Cancer Father   . Diabetes Father     Social History Social History   Tobacco Use  . Smoking status: Current Every Day Smoker    Packs/day: 0.50    Types: Cigarettes  . Smokeless tobacco: Never Used  Vaping Use  . Vaping Use: Never used  Substance Use Topics  . Alcohol use: Yes    Comment: occasionally  . Drug use: No     Allergies   Patient has no known allergies.   Review of Systems Review of Systems   Physical Exam Triage Vital Signs ED Triage Vitals  Enc Vitals Group     BP 07/03/19 1107 (!) 182/92     Pulse Rate 07/03/19 1107 (!) 102     Resp 07/03/19 1107 16     Temp 07/03/19 1107 98.4 F (36.9 C)     Temp Source 07/03/19 1107 Oral     SpO2 --      Weight --      Height --      Head Circumference --  Peak Flow --      Pain Score 07/03/19 1106 7     Pain Loc --      Pain Edu? --      Excl. in [place]? --    No data found.  Updated Vital Signs BP (!) 182/92 (BP Location: Right Arm)   Pulse (!) 102   Temp 98.4 F (36.9 C) (Oral)   Resp 16   Visual Acuity Right Eye Distance:   Left Eye Distance:   Bilateral Distance:    Right Eye Near:   Left Eye Near:    Bilateral Near:     Physical Exam Vitals and nursing note reviewed.  Constitutional:      Appearance: Normal appearance.  HENT:     Head: Normocephalic and atraumatic.     Nose: Nose normal.  Eyes:     Conjunctiva/sclera: Conjunctivae normal.  Pulmonary:     Effort: Pulmonary effort is normal.  Musculoskeletal:     Cervical back: Normal range of motion.     Right hip: Decreased range of motion. Normal strength.     Left hip: Tenderness and bony tenderness present. No crepitus. Decreased range of motion. Normal strength.  Skin:    General: Skin is warm and dry.          Comments: Mildly papulovesicular rash  to right flank No open sores or drainage  Neurological:     Mental Status: He is alert.  Psychiatric:        Mood and Affect: Mood normal.      UC Treatments / Results  Labs (all labs ordered are listed, but only abnormal results are displayed) Labs Reviewed - No data to display  EKG   Radiology DG Hip Unilat With Pelvis 2-3 Views Right  Result Date: 07/03/2019 CLINICAL DATA:  Right hip pain 4 weeks after mowing the lawn. No particular injury. EXAM: DG HIP (WITH OR WITHOUT PELVIS) 2-3V RIGHT COMPARISON:  None. FINDINGS: Diffuse decreased bone mineralization. Mild symmetric degenerative changes of the hips. No acute fracture or dislocation. Degenerative change of the spine. IMPRESSION: 1.  No acute findings. 2.  Mild symmetric osteoarthritis of the hips. Electronically Signed   By: Marin Olp M.D.   On: 07/03/2019 12:19    Procedures Procedures (including critical care time)  Medications Ordered in UC Medications - No data to display  Initial Impression / Assessment and Plan / UC Course  I have reviewed the triage vital signs and the nursing notes.  Pertinent labs & imaging results that were available during my care of the patient were reviewed by me and considered in my medical decision making (see chart for details).     Hip pain  Most likely from osteoarthritis of the hip X-ray with mild symmetrical osteoarthritis of both hips Treating this with meloxicam daily Zanaflex to help rest at night Recommend follow-up with primary care for orthopedic referral  Herpes zoster Treated with Valtrex. Meloxicam daily should help with the pain.  Final Clinical Impressions(s) / UC Diagnoses   Final diagnoses:  Hip pain  Herpes zoster without complication  Osteoarthritis of right hip, unspecified osteoarthritis type     Discharge Instructions     As we talked about your x-ray did reveal some osteoarthritis in both hips Meloxicam daily  This will also help with your  shingles pain You can also take Tylenol extra strength  Zanaflex for muscle relaxant and muscle spasm.  Valtrex for shingles Follow-up with your primary care for possible referral  to orthopedic specialist    ED Prescriptions    Medication Sig Dispense Auth. Provider   valACYclovir (VALTREX) 1000 MG tablet Take 1 tablet (1,000 mg total) by mouth 3 (three) times daily for 7 days. 21 tablet Myya Meenach A, NP   meloxicam (MOBIC) 7.5 MG tablet Take 1 tablet (7.5 mg total) by mouth daily. 30 tablet Stan Cantave A, NP   tiZANidine (ZANAFLEX) 4 MG capsule Take 1 capsule (4 mg total) by mouth 3 (three) times daily. 30 capsule Malon Siddall A, NP     PDMP not reviewed this encounter.   Orvan July, NP 07/03/19 1252    Loura Halt A, NP 07/03/19 1253

## 2019-07-03 NOTE — Discharge Instructions (Addendum)
As we talked about your x-ray did reveal some osteoarthritis in both hips Meloxicam daily  This will also help with your shingles pain You can also take Tylenol extra strength  Zanaflex for muscle relaxant and muscle spasm.  Valtrex for shingles Follow-up with your primary care for possible referral to orthopedic specialist

## 2019-08-04 ENCOUNTER — Ambulatory Visit: Payer: Medicare HMO | Admitting: Orthopaedic Surgery

## 2019-08-04 ENCOUNTER — Other Ambulatory Visit: Payer: Self-pay

## 2019-08-04 ENCOUNTER — Ambulatory Visit (INDEPENDENT_AMBULATORY_CARE_PROVIDER_SITE_OTHER): Payer: Medicare HMO

## 2019-08-04 DIAGNOSIS — M545 Low back pain, unspecified: Secondary | ICD-10-CM

## 2019-08-04 DIAGNOSIS — M25559 Pain in unspecified hip: Secondary | ICD-10-CM

## 2019-08-04 MED ORDER — METHOCARBAMOL 500 MG PO TABS: 500.0000 mg | ORAL_TABLET | Freq: Three times a day (TID) | ORAL | 0 refills | Status: DC

## 2019-08-04 MED ORDER — METHYLPREDNISOLONE 4 MG PO TABS: ORAL_TABLET | ORAL | 0 refills | Status: DC

## 2019-08-04 NOTE — Progress Notes (Signed)
Office Visit Note              Patient: Allen Whitehead                                      Date of Birth: 08-26-1950                                                   MRN: [id number] Visit Date: 08/04/2019                                                                     Requested by: No referring provider defined for this encounter. PCP: System, Pcp Not In   Assessment & Plan: Visit Diagnoses:  1. Low back pain, unspecified back pain laterality, unspecified chronicity, unspecified whether sciatica present   2. Hip pain     Plan: Place him on a Medrol Dosepak as he is not a known diabetic.  Also he is given Robaxin for the muscle spasms.  I will see him back in 6 weeks for reevaluation.  Recommend he keep appointment with primary care physician to work-up his weight loss.  Questions were encouraged and answered at length by Dr. Ninfa Linden and myself.  Follow-Up Instructions: Return in about 6 weeks (around 09/15/2019).   Orders:     Orders Placed This Encounter  Procedures  . XR Lumbar Spine 2-3 Views       Meds ordered this encounter  Medications  . methocarbamol (ROBAXIN) 500 MG tablet    Sig: Take 1 tablet (500 mg total) by mouth 3 (three) times daily.    Dispense:  40 tablet    Refill:  0  . methylPREDNISolone (MEDROL) 4 MG tablet    Sig: Take as directed    Dispense:  21 tablet    Refill:  0      Procedures: No procedures performed   Clinical Data: No additional findings.   Subjective:    Chief Complaint  Patient presents with  . Left Hip - Pain  . Right Leg - Pain  . Lower Back - Pain    HPI Allen Whitehead 68 year old male were seen for the first time for bilateral hip pain.  Pain has been ongoing for the past 9 weeks.  He states he went to the urgent care on 07/03/2019 and was told he needed bilateral hip replacements.  Is also having shoulder pain and pain is questioning if he has a rheumatoid arthritis issue.  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  Reports bilateral hip pain in the groin area and also over the lateral aspect of both hips.  States that the left hip he has been finding.  No numbness tingling down either leg notes cramping bilateral hamstrings quad area.  He ambulates with a cane but states he is using a cane due to vertigo which she states has been through  physical therapy for did not help.  He also reports a 22 pound weight loss since May 11.  He states pain in both hips is been so bad that he has not been able to eat. Has been taking Aleve and extra strength Tylenol as advised to take by urgent care and states these are not helping with his pain.  He is asking for something for pain today and also asking for a muscle relaxant.  He currently is in the process of establishing a primary care physician. AP pelvis lateral view of both hips is reviewed on canopy today and these are dated 07/03/2019 and show no acute fracture.  Both hips are well located.  Right hip mild arthritic changes with large periarticular spurring of the acetabulum.  Left hip joint mild to moderate narrowing.  No acute fracture.  Both hips are well located.  Review of Systems  Constitutional: Positive for activity change, appetite change and unexpected weight change. Negative for fever.  Musculoskeletal: Positive for arthralgias.  Neurological: Positive for dizziness.     Objective: Vital Signs: There were no vitals taken for this visit.  Physical Exam Constitutional:      Appearance: He is not ill-appearing or diaphoretic.  Pulmonary:     Effort: Pulmonary effort is normal.  Neurological:     Mental Status: He is alert and oriented to person, place, and time.     Ortho Exam Bilateral hips: Good external rotation.  Initial exam by himself no internal rotation.  Rotation attempts by Dr. Ninfa Linden shows no guarding  and is able to easily internally and externally rotate the hips bilaterally. Specialty Comments:  No specialty comments available.  Imaging: Lumbar spine 2 views: No acute fracture.  Disc space overall well-maintained.  Lower lumbar some mild facet changes.  No spondylolisthesis.  Normal lordotic curvature.   PMFS History:     Patient Active Problem List   Diagnosis Date Noted  . Near syncope 11/17/2013  . Dizziness 11/17/2013  . Vertigo 11/17/2013       Past Medical History:  Diagnosis Date  . Depression   . History of kidney stones     Family History  Problem Relation Age of Onset  . Cancer Mother   . Hypertension Mother   . Cancer Father   . Diabetes Father     No past surgical history on file. Social History        Occupational History  . Not on file  Tobacco Use  . Smoking status: Current Every Day Smoker    Packs/day: 0.50    Types: Cigarettes  . Smokeless tobacco: Never Used  Vaping Use  . Vaping Use: Never used  Substance and Sexual Activity  . Alcohol use: Yes    Comment: occasionally  . Drug use: No  . Sexual activity: Not on file

## 2019-08-09 ENCOUNTER — Other Ambulatory Visit: Payer: Self-pay

## 2019-08-09 ENCOUNTER — Encounter: Payer: Self-pay | Admitting: Registered Nurse

## 2019-08-09 ENCOUNTER — Ambulatory Visit (INDEPENDENT_AMBULATORY_CARE_PROVIDER_SITE_OTHER): Payer: Medicare HMO | Admitting: Registered Nurse

## 2019-08-09 VITALS — BP 172/93 | HR 91 | Temp 98.4°F | Resp 18 | Ht 71.0 in | Wt 208.0 lb

## 2019-08-09 DIAGNOSIS — Z130 Encounter for screening for diseases of the blood and blood-forming organs and certain disorders involving the immune mechanism: Secondary | ICD-10-CM

## 2019-08-09 DIAGNOSIS — Z1211 Encounter for screening for malignant neoplasm of colon: Secondary | ICD-10-CM | POA: Diagnosis not present

## 2019-08-09 DIAGNOSIS — Z1329 Encounter for screening for other suspected endocrine disorder: Secondary | ICD-10-CM

## 2019-08-09 DIAGNOSIS — Z13228 Encounter for screening for other metabolic disorders: Secondary | ICD-10-CM

## 2019-08-09 DIAGNOSIS — R634 Abnormal weight loss: Secondary | ICD-10-CM

## 2019-08-09 DIAGNOSIS — Z13220 Encounter for screening for lipoid disorders: Secondary | ICD-10-CM | POA: Diagnosis not present

## 2019-08-09 DIAGNOSIS — H8110 Benign paroxysmal vertigo, unspecified ear: Secondary | ICD-10-CM | POA: Diagnosis not present

## 2019-08-09 DIAGNOSIS — M2550 Pain in unspecified joint: Secondary | ICD-10-CM | POA: Diagnosis not present

## 2019-08-09 MED ORDER — MECLIZINE HCL 25 MG PO CHEW: 25.0000 mg | CHEWABLE_TABLET | Freq: Two times a day (BID) | ORAL | 0 refills | Status: DC | PRN

## 2019-08-09 NOTE — Patient Instructions (Signed)
° ° ° °  If you have lab work done today you will be contacted with your lab results within the next 2 weeks.  If you have not heard from us then please contact us. The fastest way to get your results is to register for My Chart. ° ° °IF you received an x-ray today, you will receive an invoice from [facility]. Please contact [facility] at [phone] with questions or concerns regarding your invoice.  ° °IF you received labwork today, you will receive an invoice from [facility]. Please contact [facility] at [phone] with questions or concerns regarding your invoice.  ° °Our billing staff will not be able to assist you with questions regarding bills from these companies. ° °You will be contacted with the lab results as soon as they are available. The fastest way to get your results is to activate your My Chart account. Instructions are located on the last page of this paperwork. If you have not heard from us regarding the results in 2 weeks, please contact this office. °  ° ° ° °

## 2019-08-10 ENCOUNTER — Other Ambulatory Visit: Payer: Self-pay | Admitting: Registered Nurse

## 2019-08-10 DIAGNOSIS — R768 Other specified abnormal immunological findings in serum: Secondary | ICD-10-CM

## 2019-08-10 DIAGNOSIS — E119 Type 2 diabetes mellitus without complications: Secondary | ICD-10-CM

## 2019-08-10 LAB — COMPREHENSIVE METABOLIC PANEL
ALT: 22 IU/L (ref 0–44)
AST: 21 IU/L (ref 0–40)
Albumin/Globulin Ratio: 1.1 — ABNORMAL LOW (ref 1.2–2.2)
Albumin: 3.9 g/dL (ref 3.8–4.8)
Alkaline Phosphatase: 91 IU/L (ref 48–121)
BUN/Creatinine Ratio: 18 (ref 10–24)
BUN: 14 mg/dL (ref 8–27)
Bilirubin Total: 0.3 mg/dL (ref 0.0–1.2)
CO2: 22 mmol/L (ref 20–29)
Calcium: 9.3 mg/dL (ref 8.6–10.2)
Chloride: 101 mmol/L (ref 96–106)
Creatinine, Ser: 0.76 mg/dL (ref 0.76–1.27)
GFR calc Af Amer: 109 mL/min/{1.73_m2} (ref >59)
GFR calc non Af Amer: 94 mL/min/{1.73_m2} (ref >59)
Globulin, Total: 3.7 g/dL (ref 1.5–4.5)
Glucose: 130 mg/dL — ABNORMAL HIGH (ref 65–99)
Potassium: 5 mmol/L (ref 3.5–5.2)
Sodium: 138 mmol/L (ref 134–144)
Total Protein: 7.6 g/dL (ref 6.0–8.5)

## 2019-08-10 LAB — ANA: Anti Nuclear Antibody (ANA): POSITIVE — AB

## 2019-08-10 LAB — CBC WITH DIFFERENTIAL/PLATELET
Basophils Absolute: 0 10*3/uL (ref 0.0–0.2)
Basos: 0 %
EOS (ABSOLUTE): 0 10*3/uL (ref 0.0–0.4)
Eos: 0 %
Hematocrit: 37.5 % (ref 37.5–51.0)
Hemoglobin: 12.7 g/dL — ABNORMAL LOW (ref 13.0–17.7)
Immature Grans (Abs): 0.1 10*3/uL (ref 0.0–0.1)
Immature Granulocytes: 1 %
Lymphocytes Absolute: 1.4 10*3/uL (ref 0.7–3.1)
Lymphs: 13 %
MCH: 28.6 pg (ref 26.6–33.0)
MCHC: 33.9 g/dL (ref 31.5–35.7)
MCV: 85 fL (ref 79–97)
Monocytes Absolute: 0.5 10*3/uL (ref 0.1–0.9)
Monocytes: 5 %
Neutrophils Absolute: 8.7 10*3/uL — ABNORMAL HIGH (ref 1.4–7.0)
Neutrophils: 81 %
Platelets: 386 10*3/uL (ref 150–450)
RBC: 4.44 x10E6/uL (ref 4.14–5.80)
RDW: 13 % (ref 11.6–15.4)
WBC: 10.6 10*3/uL (ref 3.4–10.8)

## 2019-08-10 LAB — LIPID PANEL
Chol/HDL Ratio: 4.4 ratio (ref 0.0–5.0)
Cholesterol, Total: 162 mg/dL (ref 100–199)
HDL: 37 mg/dL — ABNORMAL LOW (ref >39)
LDL Chol Calc (NIH): 107 mg/dL — ABNORMAL HIGH (ref 0–99)
Triglycerides: 95 mg/dL (ref 0–149)
VLDL Cholesterol Cal: 18 mg/dL (ref 5–40)

## 2019-08-10 LAB — TSH: TSH: 0.92 u[IU]/mL (ref 0.450–4.500)

## 2019-08-10 LAB — HEMOGLOBIN A1C
Est. average glucose Bld gHb Est-mCnc: 146 mg/dL
Hgb A1c MFr Bld: 6.7 % — ABNORMAL HIGH (ref 4.8–5.6)

## 2019-08-10 LAB — HIV ANTIBODY (ROUTINE TESTING W REFLEX): HIV Screen 4th Generation wRfx: NONREACTIVE

## 2019-08-10 MED ORDER — METFORMIN HCL 500 MG PO TABS: 500.0000 mg | ORAL_TABLET | Freq: Every day | ORAL | 0 refills | Status: DC

## 2019-08-11 NOTE — Progress Notes (Signed)
Called pt and sch pt for that 44m f/u on 11/12/19

## 2019-08-31 NOTE — Progress Notes (Signed)
Established Patient Office Visit  Subjective:  Patient ID: Allen Whitehead, male    DOB: [birth year]  Age: 69 y.o. MRN: [id number]  CC:  Chief Complaint  Patient presents with  . New Patient (Initial Visit)    Establish care. Patient states he has had Vertigo for 14 years so has always had a little pain but in May he started having servere Right hip pain where the pain has now moved through both hips and down both legs. Followed by knee and shoulder pain  . Constipation    Patient states he has been constitpated since he has been on multiple medications    HPI Allen Whitehead presents for visit to establish care.  Has a few complaints:  Vertigo: on and off for 14 years. No clear cause - intermittent, mild, no falls, no injury. No other neuro symptoms. Has not been seen by specialist recently for this.  Constipation: has been constipated he believes d/t polypharmacy. Having bms but less frequent, require some straining. No abdominal pain or blood in stool. No nausea or vomiting.  Hip pain: ongoing for about 2 months. Worsening. Now radiates down both legs. No hx of acute injury. Has not happened before. No numbness or weakness. Using OTCs with some relief.  Otherwise no further complaints.   Past Medical History:  Diagnosis Date  . Depression   . History of kidney stones     No past surgical history on file.  Family History  Problem Relation Age of Onset  . Cancer Mother   . Hypertension Mother   . Cancer Father   . Diabetes Father     Social History   Socioeconomic History  . Marital status: Legally Separated    Spouse name: Not on file  . Number of children: Not on file  . Years of education: Not on file  . Highest education level: Not on file  Occupational History  . Not on file  Tobacco Use  . Smoking status: Current Every Day Smoker    Packs/day: 0.50    Types: Cigarettes  . Smokeless tobacco: Never Used  Vaping Use  . Vaping Use: Never used   Substance and Sexual Activity  . Alcohol use: Yes    Comment: occasionally  . Drug use: No  . Sexual activity: Not on file  Other Topics Concern  . Not on file  Social History Narrative  . Not on file   Social Determinants of Health   Financial Resource Strain:   . Difficulty of Paying Living Expenses:   Food Insecurity:   . Worried About Charity fundraiser in the Last Year:   . Arboriculturist in the Last Year:   Transportation Needs:   . Film/video editor (Medical):   Marland Kitchen Lack of Transportation (Non-Medical):   Physical Activity:   . Days of Exercise per Week:   . Minutes of Exercise per Session:   Stress:   . Feeling of Stress :   Social Connections:   . Frequency of Communication with Friends and Family:   . Frequency of Social Gatherings with Friends and Family:   . Attends Religious Services:   . Active Member of Clubs or Organizations:   . Attends Archivist Meetings:   Marland Kitchen Marital Status:   Intimate Partner Violence:   . Fear of Current or Ex-Partner:   . Emotionally Abused:   Marland Kitchen Physically Abused:   . Sexually Abused:     Outpatient Medications Prior  to Visit  Medication Sig Dispense Refill  . methocarbamol (ROBAXIN) 500 MG tablet Take 1 tablet (500 mg total) by mouth 3 (three) times daily. 40 tablet 0  . methylPREDNISolone (MEDROL) 4 MG tablet Take as directed 21 tablet 0  . naproxen sodium (ALEVE) 220 MG tablet Take 220 mg by mouth.    Marland Kitchen tiZANidine (ZANAFLEX) 4 MG capsule Take 1 capsule (4 mg total) by mouth 3 (three) times daily. 30 capsule 0  . doxycycline (VIBRA-TABS) 100 MG tablet Take 1 tablet (100 mg total) by mouth 2 (two) times daily. (Patient not taking: Reported on 08/09/2019) 20 tablet 0  . Meclizine HCl (BONINE) 25 MG CHEW Chew 25 mg by mouth 2 (two) times daily as needed (dizziness/vertigo). Reported on 05/13/2015 (Patient not taking: Reported on 08/09/2019)    . meloxicam (MOBIC) 7.5 MG tablet Take 1 tablet (7.5 mg total) by mouth  daily. (Patient not taking: Reported on 08/09/2019) 30 tablet 1   No facility-administered medications prior to visit.    No Known Allergies  ROS Review of Systems  Constitutional: Negative.   HENT: Negative.   Eyes: Negative.   Respiratory: Negative.   Cardiovascular: Negative.   Gastrointestinal: Positive for constipation. Negative for abdominal distention, abdominal pain, anal bleeding, blood in stool, diarrhea, nausea, rectal pain and vomiting.  Endocrine: Negative.   Genitourinary: Negative.   Musculoskeletal: Positive for arthralgias. Negative for back pain, gait problem, joint swelling, myalgias, neck pain and neck stiffness.  Skin: Negative.   Allergic/Immunologic: Negative.   Neurological: Positive for dizziness. Negative for tremors, seizures, syncope, facial asymmetry, speech difficulty, weakness, light-headedness, numbness and headaches.  Hematological: Negative.   Psychiatric/Behavioral: Negative.   All other systems reviewed and are negative.     Objective:    Physical Exam Vitals and nursing note reviewed.  Constitutional:      General: He is not in acute distress.    Appearance: Normal appearance. He is normal weight. He is not ill-appearing, toxic-appearing or diaphoretic.  Cardiovascular:     Rate and Rhythm: Normal rate and regular rhythm.     Pulses: Normal pulses.     Heart sounds: Normal heart sounds. No murmur heard.  No friction rub. No gallop.   Pulmonary:     Effort: Pulmonary effort is normal. No respiratory distress.     Breath sounds: Normal breath sounds. No stridor. No wheezing, rhonchi or rales.  Chest:     Chest wall: No tenderness.  Musculoskeletal:        General: No swelling, tenderness, deformity or signs of injury. Normal range of motion.     Right lower leg: No edema.     Left lower leg: No edema.  Skin:    General: Skin is warm and dry.     Capillary Refill: Capillary refill takes less than 2 seconds.     Coloration: Skin is  not jaundiced or pale.     Findings: No bruising, erythema, lesion or rash.  Neurological:     General: No focal deficit present.     Mental Status: He is alert and oriented to person, place, and time. Mental status is at baseline.     GCS: GCS eye subscore is 4. GCS verbal subscore is 5. GCS motor subscore is 6.     Cranial Nerves: Cranial nerves are intact.     Sensory: Sensation is intact.     Motor: Motor function is intact.     Coordination: Coordination is intact.  Psychiatric:  Mood and Affect: Mood normal.        Behavior: Behavior normal.        Thought Content: Thought content normal.        Judgment: Judgment normal.     BP (!) 172/93   Pulse 91   Temp 98.4 F (36.9 C) (Temporal)   Resp 18   Ht 5\' 11"  (1.803 m)   Wt 208 lb (94.3 kg)   SpO2 98%   BMI 29.01 kg/m  Wt Readings from Last 3 Encounters:  08/09/19 208 lb (94.3 kg)  05/13/15 240 lb (108.9 kg)  11/17/13 219 lb (99.3 kg)     Health Maintenance Due  Topic Date Due  . URINE MICROALBUMIN  Never done  . INFLUENZA VACCINE  08/22/2019    There are no preventive care reminders to display for this patient.  Lab Results  Component Value Date   TSH 0.920 08/09/2019   Lab Results  Component Value Date   WBC 10.6 08/09/2019   HGB 12.7 (L) 08/09/2019   HCT 37.5 08/09/2019   MCV 85 08/09/2019   PLT 386 08/09/2019   Lab Results  Component Value Date   NA 138 08/09/2019   K 5.0 08/09/2019   CO2 22 08/09/2019   GLUCOSE 130 (H) 08/09/2019   BUN 14 08/09/2019   CREATININE 0.76 08/09/2019   BILITOT 0.3 08/09/2019   ALKPHOS 91 08/09/2019   AST 21 08/09/2019   ALT 22 08/09/2019   PROT 7.6 08/09/2019   ALBUMIN 3.9 08/09/2019   CALCIUM 9.3 08/09/2019   ANIONGAP 14 11/13/2013   Lab Results  Component Value Date   CHOL 162 08/09/2019   Lab Results  Component Value Date   HDL 37 (L) 08/09/2019   Lab Results  Component Value Date   LDLCALC 107 (H) 08/09/2019   Lab Results  Component  Value Date   TRIG 95 08/09/2019   Lab Results  Component Value Date   CHOLHDL 4.4 08/09/2019   Lab Results  Component Value Date   HGBA1C 6.7 (H) 08/09/2019      Assessment & Plan:   Problem List Items Addressed This Visit    None    Visit Diagnoses    Screening for endocrine, metabolic and immunity disorder    -  Primary   Relevant Orders   TSH (Completed)   CBC with Differential (Completed)   Comprehensive metabolic panel (Completed)   Hemoglobin A1c (Completed)   Lipid screening       Relevant Orders   Lipid panel (Completed)   Multiple joint pain       Relevant Orders   ANA (Completed)   Special screening for malignant neoplasms, colon       Relevant Orders   Ambulatory referral to Gastroenterology   Unexplained weight loss       Relevant Orders   TSH (Completed)   Lipid panel (Completed)   CBC with Differential (Completed)   Comprehensive metabolic panel (Completed)   Hemoglobin A1c (Completed)   HIV antibody (with reflex) (Completed)   Benign paroxysmal positional vertigo, unspecified laterality       Relevant Medications   Meclizine HCl (BONINE) 25 MG CHEW      Meds ordered this encounter  Medications  . Meclizine HCl (BONINE) 25 MG CHEW    Sig: Chew 1 tablet (25 mg total) by mouth 2 (two) times daily as needed (dizziness/vertigo). Reported on 05/13/2015    Dispense:  90 tablet    Refill:  0  Order Specific Question:   Supervising Provider    Answer:   Carlota Raspberry, JEFFREY R [[id number]]    Follow-up: No follow-ups on file.   PLAN  Start meclizine 25mg  PO tid PRN for vertigo.  Labs collected, will follow up as warranted  Discussed nonpharm for constipation -   He will return at his convenience to discuss his elevated BP  Referred to GI for colonoscopy  Patient encouraged to call clinic with any questions, comments, or concerns.  Maximiano Coss, NP

## 2019-09-15 ENCOUNTER — Ambulatory Visit: Payer: Medicare HMO | Admitting: Orthopaedic Surgery

## 2019-09-15 ENCOUNTER — Ambulatory Visit (INDEPENDENT_AMBULATORY_CARE_PROVIDER_SITE_OTHER): Payer: Medicare HMO | Admitting: Registered Nurse

## 2019-09-15 ENCOUNTER — Encounter: Payer: Self-pay | Admitting: Registered Nurse

## 2019-09-15 ENCOUNTER — Other Ambulatory Visit: Payer: Self-pay

## 2019-09-15 VITALS — BP 165/96 | HR 89 | Temp 98.2°F | Resp 18 | Ht 71.0 in | Wt 206.6 lb

## 2019-09-15 DIAGNOSIS — M353 Polymyalgia rheumatica: Secondary | ICD-10-CM | POA: Diagnosis not present

## 2019-09-15 MED ORDER — CELECOXIB 100 MG PO CAPS: 100.0000 mg | ORAL_CAPSULE | Freq: Two times a day (BID) | ORAL | 0 refills | Status: DC

## 2019-09-15 MED ORDER — TIZANIDINE HCL 4 MG PO CAPS: 4.0000 mg | ORAL_CAPSULE | Freq: Three times a day (TID) | ORAL | 0 refills | Status: DC

## 2019-09-15 MED ORDER — PREDNISONE 10 MG PO TABS: ORAL_TABLET | ORAL | 0 refills | Status: AC

## 2019-09-15 NOTE — Patient Instructions (Signed)
° ° ° °  If you have lab work done today you will be contacted with your lab results within the next 2 weeks.  If you have not heard from us then please contact us. The fastest way to get your results is to register for My Chart. ° ° °IF you received an x-ray today, you will receive an invoice from [facility]. Please contact [facility] at [phone] with questions or concerns regarding your invoice.  ° °IF you received labwork today, you will receive an invoice from [facility]. Please contact [facility] at [phone] with questions or concerns regarding your invoice.  ° °Our billing staff will not be able to assist you with questions regarding bills from these companies. ° °You will be contacted with the lab results as soon as they are available. The fastest way to get your results is to activate your My Chart account. Instructions are located on the last page of this paperwork. If you have not heard from us regarding the results in 2 weeks, please contact this office. °  ° ° ° °

## 2019-09-15 NOTE — Progress Notes (Signed)
Acute Office Visit  Subjective:    Patient ID: BINGHAM MILLETTE, male    DOB: Dec 13, 1950, 69 y.o.   MRN: [id number]  Chief Complaint  Patient presents with  . Follow-up    patient states he is still having some joint pain in shoulders, neck and hands. Per patient he thought it was because of the cool ait but its not.    HPI Patient is in today for ongoing joint pain  Still mostly in hips and shoulders, but hands have been worse.  Hands: worse on waking. Better after using them for a bit. Feel swollen, tight. No nodes. Did have a positive ANA last visit - working on getting set with Rheum. Seems likely to be RA. Hip and Shoulder pain: acute sudden onset. Better with a round of medrol dose pack, but still lingering. 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 Not checking sugars, but he is interested in restarting steroid for pain at this time. Never rec'd tizanidine. Has been taking meloxicam that belongs to a friend - discouraged this.   Past Medical History:  Diagnosis Date  . Depression   . History of kidney stones     No past surgical history on file.  Family History  Problem Relation Age of Onset  . Cancer Mother   . Hypertension Mother   . Cancer Father   . Diabetes Father     Social History   Socioeconomic History  . Marital status: Legally Separated    Spouse name: Not on file  . Number of children: Not on file  . Years of education: Not on file  . Highest education level: Not on file  Occupational History  . Not on file  Tobacco Use  . Smoking status: Current Every Day Smoker    Packs/day: 0.50    Types: Cigarettes  . Smokeless tobacco: Never Used  Vaping Use  . Vaping Use: Never used  Substance and Sexual Activity  . Alcohol use: Yes    Comment: occasionally  . Drug use: No  . Sexual activity: Not on file  Other Topics Concern  . Not on file  Social History Narrative  . Not on file   Social Determinants of  Health   Financial Resource Strain:   . Difficulty of Paying Living Expenses: Not on file  Food Insecurity:   . Worried About Charity fundraiser in the Last Year: Not on file  . Ran Out of Food in the Last Year: Not on file  Transportation Needs:   . Lack of Transportation (Medical): Not on file  . Lack of Transportation (Non-Medical): Not on file  Physical Activity:   . Days of Exercise per Week: Not on file  . Minutes of Exercise per Session: Not on file  Stress:   . Feeling of Stress : Not on file  Social Connections:   . Frequency of Communication with Friends and Family: Not on file  . Frequency of Social Gatherings with Friends and Family: Not on file  . Attends Religious Services: Not on file  . Active Member of Clubs or Organizations: Not on file  . Attends Archivist Meetings: Not on file  . Marital Status: Not on file  Intimate Partner Violence:   . Fear of Current or Ex-Partner: Not on file  . Emotionally Abused: Not on file  . Physically Abused: Not on file  . Sexually Abused: Not on file    Outpatient  Medications Prior to Visit  Medication Sig Dispense Refill  . Meclizine HCl (BONINE) 25 MG CHEW Chew 1 tablet (25 mg total) by mouth 2 (two) times daily as needed (dizziness/vertigo). Reported on 05/13/2015 90 tablet 0  . meloxicam (MOBIC) 15 MG tablet Take 15 mg by mouth daily.    . metFORMIN (GLUCOPHAGE) 500 MG tablet Take 1 tablet (500 mg total) by mouth daily with breakfast. 90 tablet 0  . methocarbamol (ROBAXIN) 500 MG tablet Take 1 tablet (500 mg total) by mouth 3 (three) times daily. 40 tablet 0  . naproxen sodium (ALEVE) 220 MG tablet Take 220 mg by mouth.    . methylPREDNISolone (MEDROL) 4 MG tablet Take as directed (Patient not taking: Reported on 09/15/2019) 21 tablet 0  . tiZANidine (ZANAFLEX) 4 MG capsule Take 1 capsule (4 mg total) by mouth 3 (three) times daily. (Patient not taking: Reported on 09/15/2019) 30 capsule 0   No facility-administered  medications prior to visit.    No Known Allergies  Review of Systems  Constitutional: Negative.   HENT: Negative.   Eyes: Negative.   Respiratory: Negative.   Cardiovascular: Negative.   Gastrointestinal: Negative.   Endocrine: Negative.   Genitourinary: Negative.   Musculoskeletal: Positive for arthralgias, joint swelling and neck stiffness. Negative for back pain, gait problem and neck pain.  Skin: Negative.   Allergic/Immunologic: Negative.   Neurological: Negative.   Hematological: Negative.   Psychiatric/Behavioral: Negative.   All other systems reviewed and are negative.      Objective:    Physical Exam Vitals and nursing note reviewed.  Constitutional:      Appearance: Normal appearance. He is normal weight.  Musculoskeletal:        General: Tenderness present. No swelling, deformity or signs of injury.     Cervical back: Rigidity present.     Right lower leg: No edema.     Left lower leg: No edema.  Skin:    General: Skin is warm and dry.  Neurological:     General: No focal deficit present.     Mental Status: He is alert and oriented to person, place, and time. Mental status is at baseline.  Psychiatric:        Mood and Affect: Mood normal.        Behavior: Behavior normal.        Thought Content: Thought content normal.        Judgment: Judgment normal.     BP (!) 165/96   Pulse 89   Temp 98.2 F (36.8 C) (Temporal)   Resp 18   Ht 5\' 11"  (1.803 m)   Wt 206 lb 9.6 oz (93.7 kg)   SpO2 98%   BMI 28.81 kg/m  Wt Readings from Last 3 Encounters:  09/15/19 206 lb 9.6 oz (93.7 kg)  08/09/19 208 lb (94.3 kg)  05/13/15 240 lb (108.9 kg)    Health Maintenance Due  Topic Date Due  . URINE MICROALBUMIN  Never done    There are no preventive care reminders to display for this patient.   Lab Results  Component Value Date   TSH 0.920 08/09/2019   Lab Results  Component Value Date   WBC 10.6 08/09/2019   HGB 12.7 (L) 08/09/2019   HCT 37.5  08/09/2019   MCV 85 08/09/2019   PLT 386 08/09/2019   Lab Results  Component Value Date   NA 138 08/09/2019   K 5.0 08/09/2019   CO2 22 08/09/2019   GLUCOSE  130 (H) 08/09/2019   BUN 14 08/09/2019   CREATININE 0.76 08/09/2019   BILITOT 0.3 08/09/2019   ALKPHOS 91 08/09/2019   AST 21 08/09/2019   ALT 22 08/09/2019   PROT 7.6 08/09/2019   ALBUMIN 3.9 08/09/2019   CALCIUM 9.3 08/09/2019   ANIONGAP 14 11/13/2013   Lab Results  Component Value Date   CHOL 162 08/09/2019   Lab Results  Component Value Date   HDL 37 (L) 08/09/2019   Lab Results  Component Value Date   LDLCALC 107 (H) 08/09/2019   Lab Results  Component Value Date   TRIG 95 08/09/2019   Lab Results  Component Value Date   CHOLHDL 4.4 08/09/2019   Lab Results  Component Value Date   HGBA1C 6.7 (H) 08/09/2019       Assessment & Plan:   Problem List Items Addressed This Visit    None    Visit Diagnoses    PMR (polymyalgia rheumatica) (HCC)    -  Primary   Relevant Medications   predniSONE (DELTASONE) 10 MG tablet   tiZANidine (ZANAFLEX) 4 MG capsule   celecoxib (CELEBREX) 100 MG capsule       Meds ordered this encounter  Medications  . predniSONE (DELTASONE) 10 MG tablet    Sig: Take 3 tablets (30 mg total) by mouth daily with breakfast for 3 days, THEN 2 tablets (20 mg total) daily with breakfast for 3 days, THEN 1 tablet (10 mg total) daily with breakfast for 3 days.    Dispense:  18 tablet    Refill:  0    Order Specific Question:   Supervising Provider    Answer:   Carlota Raspberry, JEFFREY R [[id number]]  . tiZANidine (ZANAFLEX) 4 MG capsule    Sig: Take 1 capsule (4 mg total) by mouth 3 (three) times daily.    Dispense:  30 capsule    Refill:  0    Order Specific Question:   Supervising Provider    Answer:   Carlota Raspberry, JEFFREY R [[id number]]  . celecoxib (CELEBREX) 100 MG capsule    Sig: Take 1 capsule (100 mg total) by mouth 2 (two) times daily.    Dispense:  90 capsule    Refill:  0    Order  Specific Question:   Supervising Provider    Answer:   Carlota Raspberry, JEFFREY R [[id number]]   PLAN  Seems likely PMR, no concerning symptoms for GCA.   9 day steroid taper celebrex and zanaflex prn throughout  Continue to work to set up rheum appt  Patient encouraged to call clinic with any questions, comments, or concerns.   Maximiano Coss, NP

## 2019-10-06 ENCOUNTER — Encounter: Payer: Self-pay | Admitting: Gastroenterology

## 2019-10-11 DIAGNOSIS — Z111 Encounter for screening for respiratory tuberculosis: Secondary | ICD-10-CM | POA: Diagnosis not present

## 2019-10-11 DIAGNOSIS — M150 Primary generalized (osteo)arthritis: Secondary | ICD-10-CM | POA: Diagnosis not present

## 2019-10-11 DIAGNOSIS — E663 Overweight: Secondary | ICD-10-CM | POA: Diagnosis not present

## 2019-10-11 DIAGNOSIS — R5383 Other fatigue: Secondary | ICD-10-CM | POA: Diagnosis not present

## 2019-10-11 DIAGNOSIS — M542 Cervicalgia: Secondary | ICD-10-CM | POA: Diagnosis not present

## 2019-10-11 DIAGNOSIS — M2550 Pain in unspecified joint: Secondary | ICD-10-CM | POA: Diagnosis not present

## 2019-10-11 DIAGNOSIS — M7062 Trochanteric bursitis, left hip: Secondary | ICD-10-CM | POA: Diagnosis not present

## 2019-10-11 DIAGNOSIS — M7061 Trochanteric bursitis, right hip: Secondary | ICD-10-CM | POA: Diagnosis not present

## 2019-10-11 DIAGNOSIS — M064 Inflammatory polyarthropathy: Secondary | ICD-10-CM | POA: Diagnosis not present

## 2019-10-11 DIAGNOSIS — Z6829 Body mass index (BMI) 29.0-29.9, adult: Secondary | ICD-10-CM | POA: Diagnosis not present

## 2019-10-12 ENCOUNTER — Ambulatory Visit: Payer: Medicare HMO | Admitting: Registered Nurse

## 2019-10-12 VITALS — BP 165/96 | Ht 71.0 in | Wt 206.0 lb

## 2019-10-12 DIAGNOSIS — Z0000 Encounter for general adult medical examination without abnormal findings: Secondary | ICD-10-CM

## 2019-10-12 NOTE — Patient Instructions (Signed)
Thank you for taking time to come for your Medicare Wellness Visit. I appreciate your ongoing commitment to your health goals. Please review the following plan we discussed and let me know if I can assist you in the future.  Allen Kennedy LPN  Preventive Care 67 Years and Older, Male Preventive care refers to lifestyle choices and visits with your health care provider that can promote health and wellness. This includes:  A yearly physical exam. This is also called an annual well check.  Regular dental and eye exams.  Immunizations.  Screening for certain conditions.  Healthy lifestyle choices, such as diet and exercise. What can I expect for my preventive care visit? Physical exam Your health care provider will check:  Height and weight. These may be used to calculate body mass index (BMI), which is a measurement that tells if you are at a healthy weight.  Heart rate and blood pressure.  Your skin for abnormal spots. Counseling Your health care provider may ask you questions about:  Alcohol, tobacco, and drug use.  Emotional well-being.  Home and relationship well-being.  Sexual activity.  Eating habits.  History of falls.  Memory and ability to understand (cognition).  Work and work Statistician. What immunizations do I need?  Influenza (flu) vaccine  This is recommended every year. Tetanus, diphtheria, and pertussis (Tdap) vaccine  You may need a Td booster every 10 years. Varicella (chickenpox) vaccine  You may need this vaccine if you have not already been vaccinated. Zoster (shingles) vaccine  You may need this after age 66. Pneumococcal conjugate (PCV13) vaccine  One dose is recommended after age 84. Pneumococcal polysaccharide (PPSV23) vaccine  One dose is recommended after age 88. Measles, mumps, and rubella (MMR) vaccine  You may need at least one dose of MMR if you were born in 1957 or later. You may also need a second dose. Meningococcal  conjugate (MenACWY) vaccine  You may need this if you have certain conditions. Hepatitis A vaccine  You may need this if you have certain conditions or if you travel or work in places where you may be exposed to hepatitis A. Hepatitis B vaccine  You may need this if you have certain conditions or if you travel or work in places where you may be exposed to hepatitis B. Haemophilus influenzae type b (Hib) vaccine  You may need this if you have certain conditions. You may receive vaccines as individual doses or as more than one vaccine together in one shot (combination vaccines). Talk with your health care provider about the risks and benefits of combination vaccines. What tests do I need? Blood tests  Lipid and cholesterol levels. These may be checked every 5 years, or more frequently depending on your overall health.  Hepatitis C test.  Hepatitis B test. Screening  Lung cancer screening. You may have this screening every year starting at age 24 if you have a 30-pack-year history of smoking and currently smoke or have quit within the past 15 years.  Colorectal cancer screening. All adults should have this screening starting at age 52 and continuing until age 61. Your health care provider may recommend screening at age 57 if you are at increased risk. You will have tests every 1-10 years, depending on your results and the type of screening test.  Prostate cancer screening. Recommendations will vary depending on your family history and other risks.  Diabetes screening. This is done by checking your blood sugar (glucose) after you have not eaten for  a while (fasting). You may have this done every 1-3 years.  Abdominal aortic aneurysm (AAA) screening. You may need this if you are a current or former smoker.  Sexually transmitted disease (STD) testing. Follow these instructions at home: Eating and drinking  Eat a diet that includes fresh fruits and vegetables, whole grains, lean  protein, and low-fat dairy products. Limit your intake of foods with high amounts of sugar, saturated fats, and salt.  Take vitamin and mineral supplements as recommended by your health care provider.  Do not drink alcohol if your health care provider tells you not to drink.  If you drink alcohol: ? Limit how much you have to 0-2 drinks a day. ? Be aware of how much alcohol is in your drink. In the [place] one drink equals one 12 oz bottle of beer (355 mL), one 5 oz glass of wine (148 mL), or one 1 oz glass of hard liquor (44 mL). Lifestyle  Take daily care of your teeth and gums.  Stay active. Exercise for at least 30 minutes on 5 or more days each week.  Do not use any products that contain nicotine or tobacco, such as cigarettes, e-cigarettes, and chewing tobacco. If you need help quitting, ask your health care provider.  If you are sexually active, practice safe sex. Use a condom or other form of protection to prevent STIs (sexually transmitted infections).  Talk with your health care provider about taking a low-dose aspirin or statin. What's next?  Visit your health care provider once a year for a well check visit.  Ask your health care provider how often you should have your eyes and teeth checked.  Stay up to date on all vaccines. This information is not intended to replace advice given to you by your health care provider. Make sure you discuss any questions you have with your health care provider. Document Revised: 01/01/2018 Document Reviewed: 01/01/2018 Elsevier Patient Education  2020 Elsevier Inc.  

## 2019-10-12 NOTE — Progress Notes (Signed)
Presents today for [employer] Visit   Date of last exam: 09-15-2019  Interpreter used for this visit?  I connected with  [name] on 10/12/19 by a telephone  and verified that I am speaking with the correct person using two identifiers.   I discussed the limitations of evaluation and management by telemedicine. The patient expressed understanding and agreed to proceed.  Patient location: home  Provider location: in office  I provided 20 minutes of non face - to - face time during this encounter.  Patient Care Team: Maximiano Coss, NP as PCP - General (Adult Health Nurse Practitioner)   Other items to address today:  Discussed Eye/Dental Discussed Immunizations Patient will monitor BP at home and bring readings to appointment 10/22 @ 2:10 with Orland Mustard.  Rheumatologist  Changed dose of Celebrex  Follow up scheduled      Other Screening: Last screening for diabetes: 08/09/2019 Last lipid screening: 08/09/2019  ADVANCE DIRECTIVES: Discussed: yes On File: no Materials Provided: yes  Immunization status:  Immunization History  Administered Date(s) Administered   PFIZER SARS-COV-2 Vaccination 03/07/2019, 03/30/2019     Health Maintenance Due  Topic Date Due   URINE MICROALBUMIN  Never done     Functional Status Survey: Is the patient deaf or have difficulty hearing?: No Does the patient have difficulty seeing, even when wearing glasses/contacts?: No Does the patient have difficulty concentrating, remembering, or making decisions?: No Does the patient have difficulty walking or climbing stairs?: No Does the patient have difficulty dressing or bathing?: No Does the patient have difficulty doing errands alone such as visiting a doctor's office or shopping?: No   6CIT Screen 10/12/2019  What Year? 0 points  What month? 0 points  What time? 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 No pertinent surgical history.   Family History  Problem Relation Age of Onset   Cancer Mother    Hypertension Mother    Cancer Father    Diabetes Father      Social History   Socioeconomic History   Marital status: Legally Separated    Spouse name: Not on file   Number of children: Not on file   Years of education: Not on file   Highest education level: Not on file  Occupational History   Not on file  Tobacco Use   Smoking status: Current Every Day Smoker    Packs/day: 0.50    Types: Cigarettes   Smokeless tobacco: Never Used  Vaping Use   Vaping Use: Never used  Substance and Sexual Activity   Alcohol use: Yes    Comment: occasionally  Drug use: No   Sexual activity: Not on file  Other Topics Concern   Not on file  Social History Narrative   Not on file   Social Determinants of Health   Financial Resource Strain:    Difficulty of Paying Living Expenses: Not on file  Food Insecurity:    Worried About [place] in the Last Year: Not on file   Ran Out of Food in the Last Year: Not on file  Transportation Needs:    Lack of Transportation (Medical): Not on file   Lack of Transportation (Non-Medical): Not on file  Physical Activity:    Days of Exercise per Week: Not on file    Minutes of Exercise per Session: Not on file  Stress:    Feeling of Stress : Not on file  Social Connections:    Frequency of Communication with Friends and Family: Not on file   Frequency of Social Gatherings with Friends and Family: Not on file   Attends Religious Services: Not on file   Active Member of Clubs or Organizations: Not on file   Attends Archivist Meetings: Not on file   Marital Status: Not on file  Intimate Partner Violence:    Fear of Current or Ex-Partner: Not on file   Emotionally Abused: Not on file   Physically Abused: Not on file   Sexually Abused: Not on file     No Known Allergies   Prior to Admission medications   Medication Sig Start Date End Date Taking? Authorizing Provider  celecoxib (CELEBREX) 100 MG capsule Take 1 capsule (100 mg total) by mouth 2 (two) times daily. Patient taking differently: Take 200 mg by mouth 2 (two) times daily.  09/15/19  Yes Maximiano Coss, NP  metFORMIN (GLUCOPHAGE) 500 MG tablet Take 1 tablet (500 mg total) by mouth daily with breakfast. 08/10/19  Yes Maximiano Coss, NP  Meclizine HCl (BONINE) 25 MG CHEW Chew 1 tablet (25 mg total) by mouth 2 (two) times daily as needed (dizziness/vertigo). Reported on 05/13/2015 Patient not taking: Reported on 10/12/2019 08/09/19   Maximiano Coss, NP  meloxicam (MOBIC) 15 MG tablet Take 15 mg by mouth daily. Patient not taking: Reported on 10/12/2019    [provider]  methocarbamol (ROBAXIN) 500 MG tablet Take 1 tablet (500 mg total) by mouth 3 (three) times daily. Patient not taking: Reported on 10/12/2019 08/04/19   Pete Pelt, PA-C  methylPREDNISolone (MEDROL) 4 MG tablet Take as directed Patient not taking: Reported on 09/15/2019 08/04/19   Pete Pelt, PA-C  naproxen sodium (ALEVE) 220 MG tablet Take 220 mg by mouth. Patient not taking: Reported on 10/12/2019    [provider]  tiZANidine (ZANAFLEX) 4 MG capsule Take 1 capsule (4 mg total)  by mouth 3 (three) times daily. Patient not taking: Reported on 10/12/2019 09/15/19   Maximiano Coss, NP     Depression screen [facility] 2/9 10/12/2019 09/15/2019 08/09/2019 05/13/2015 05/13/2015  Decreased Interest 0 0 0 0 0  Down, Depressed, Hopeless 0 0 0 0 0  PHQ - 2 Score 0 0 0 0 0     Fall Risk  10/12/2019 09/15/2019 08/09/2019 05/13/2015 05/13/2015  Falls in the past year? 0 0 0 No No  Number falls in past yr: 0 0 0 - -  Injury with Fall? 0 0 0 - -  Follow up Falls evaluation completed;Education provided Falls evaluation completed Falls evaluation completed - -      PHYSICAL EXAM:  BP (!) 165/96    Ht 5\' 11"  (1.803 m)    Wt 206 lb (93.4 kg)    BMI 28.73 kg/m    Wt Readings from Last 3 Encounters:  10/12/19 206 lb (93.4 kg)  09/15/19 206 lb 9.6 oz (93.7 kg)  08/09/19 208 lb (94.3 kg)       Education/Counseling provided regarding diet and exercise, prevention of chronic diseases, smoking/tobacco cessation, if applicable, and reviewed "Covered Medicare Preventive Services."

## 2019-10-24 ENCOUNTER — Other Ambulatory Visit: Payer: Self-pay | Admitting: Registered Nurse

## 2019-10-24 DIAGNOSIS — M353 Polymyalgia rheumatica: Secondary | ICD-10-CM

## 2019-10-24 NOTE — Telephone Encounter (Signed)
Requested Prescriptions  Pending Prescriptions Disp Refills  . celecoxib (CELEBREX) 100 MG capsule [Pharmacy Med Name: CELECOXIB 100 MG CAPSULE] 90 capsule 0    Sig: TAKE 1 CAPSULE BY MOUTH TWICE A DAY     Analgesics:  COX2 Inhibitors Failed - 10/24/2019  3:48 AM      Failed - HGB in normal range and within 360 days    Hemoglobin  Date Value Ref Range Status  08/09/2019 12.7 (L) 13.0 - 17.7 g/dL Final         Passed - Cr in normal range and within 360 days    Creatinine, Ser  Date Value Ref Range Status  08/09/2019 0.76 0.76 - 1.27 mg/dL Final         Passed - Patient is not pregnant      Passed - Valid encounter within last 12 months    Recent Outpatient Visits          1 month ago PMR (polymyalgia rheumatica) ([place])   [facility], Delfino Lovett, NP   2 months ago Screening for endocrine, metabolic and immunity disorder   [facility], Delfino Lovett, NP   4 years ago Cellulitis of toe, unspecified laterality   [facility] at [facility], Linton Ham, MD   5 years ago Ataxia   [facility] at [facility], MD   5 years ago Dizziness   [facility], Laurey Arrow, MD      Future Appointments            In 2 weeks Maximiano Coss, NP [facility] at [place], [facility]

## 2019-11-08 DIAGNOSIS — Z6829 Body mass index (BMI) 29.0-29.9, adult: Secondary | ICD-10-CM | POA: Diagnosis not present

## 2019-11-08 DIAGNOSIS — M150 Primary generalized (osteo)arthritis: Secondary | ICD-10-CM | POA: Diagnosis not present

## 2019-11-08 DIAGNOSIS — E663 Overweight: Secondary | ICD-10-CM | POA: Diagnosis not present

## 2019-11-08 DIAGNOSIS — M2550 Pain in unspecified joint: Secondary | ICD-10-CM | POA: Diagnosis not present

## 2019-11-08 DIAGNOSIS — M064 Inflammatory polyarthropathy: Secondary | ICD-10-CM | POA: Diagnosis not present

## 2019-11-12 ENCOUNTER — Ambulatory Visit (INDEPENDENT_AMBULATORY_CARE_PROVIDER_SITE_OTHER): Payer: Medicare HMO | Admitting: Registered Nurse

## 2019-11-12 ENCOUNTER — Other Ambulatory Visit: Payer: Self-pay

## 2019-11-12 VITALS — BP 142/88 | HR 86 | Temp 98.7°F | Resp 16 | Ht 71.0 in | Wt 213.2 lb

## 2019-11-12 DIAGNOSIS — E119 Type 2 diabetes mellitus without complications: Secondary | ICD-10-CM | POA: Diagnosis not present

## 2019-11-12 LAB — POCT GLYCOSYLATED HEMOGLOBIN (HGB A1C): Hemoglobin A1C: 6.2 % — AB (ref 4.0–5.6)

## 2019-11-12 MED ORDER — METFORMIN HCL 500 MG PO TABS: 500.0000 mg | ORAL_TABLET | Freq: Every day | ORAL | 0 refills | Status: DC

## 2019-11-12 NOTE — Patient Instructions (Signed)
° ° ° °  If you have lab work done today you will be contacted with your lab results within the next 2 weeks.  If you have not heard from us then please contact us. The fastest way to get your results is to register for My Chart. ° ° °IF you received an x-ray today, you will receive an invoice from [facility]. Please contact [facility] at [phone] with questions or concerns regarding your invoice.  ° °IF you received labwork today, you will receive an invoice from [facility]. Please contact [facility] at [phone] with questions or concerns regarding your invoice.  ° °Our billing staff will not be able to assist you with questions regarding bills from these companies. ° °You will be contacted with the lab results as soon as they are available. The fastest way to get your results is to activate your My Chart account. Instructions are located on the last page of this paperwork. If you have not heard from us regarding the results in 2 weeks, please contact this office. °  ° ° ° °

## 2019-11-13 LAB — MICROALBUMIN, URINE: Microalbumin, Urine: 4.6 ug/mL

## 2019-11-15 ENCOUNTER — Encounter: Payer: Self-pay | Admitting: Radiology

## 2019-11-15 NOTE — Progress Notes (Signed)
Good morning,  Normal results letter, please!  Thanks,  Kathrin Ruddy, NP

## 2019-11-18 ENCOUNTER — Encounter: Payer: Medicare HMO | Admitting: Internal Medicine

## 2019-11-29 ENCOUNTER — Other Ambulatory Visit: Payer: Self-pay

## 2019-11-29 ENCOUNTER — Encounter: Payer: Self-pay | Admitting: Gastroenterology

## 2019-11-29 ENCOUNTER — Ambulatory Visit (AMBULATORY_SURGERY_CENTER): Payer: Self-pay | Admitting: *Deleted

## 2019-11-29 VITALS — Ht 71.0 in | Wt 221.0 lb

## 2019-11-29 DIAGNOSIS — Z1211 Encounter for screening for malignant neoplasm of colon: Secondary | ICD-10-CM

## 2019-11-29 MED ORDER — PLENVU 140 G PO SOLR: 1.0000 | ORAL | 0 refills | Status: DC

## 2019-11-29 NOTE — Progress Notes (Signed)
cov vax x 2   No egg or soy allergy known to patient  No issues with past sedation with any surgeries or procedures no intubation problems in the past  No FH of Malignant Hyperthermia No diet pills per patient No home 02 use per patient  No blood thinners per patient  Pt denies issues with constipation  No A fib or A flutter  EMMI video to pt or via [place] 19 guidelines implemented in [place] today with Pt and RN   [employer]- Lot [id number] EXP 04/2020  Due to the COVID-19 pandemic we are asking patients to follow these guidelines. Please only bring one care partner. Please be aware that your care partner may wait in the car in the parking lot or if they feel like they will be too hot to wait in the car, they may wait in the lobby on the 4th floor. All care partners are required to wear a mask the entire time (we do not have any that we can provide them), they need to practice social distancing, and we will do a Covid check for all patient's and care partners when you arrive. Also we will check their temperature and your temperature. If the care partner waits in their car they need to stay in the parking lot the entire time and we will call them on their cell phone when the patient is ready for discharge so they can bring the car to the front of the building. Also all patient's will need to wear a mask into building.

## 2019-12-13 ENCOUNTER — Encounter: Payer: Self-pay | Admitting: Gastroenterology

## 2019-12-13 ENCOUNTER — Other Ambulatory Visit: Payer: Self-pay

## 2019-12-13 ENCOUNTER — Ambulatory Visit (AMBULATORY_SURGERY_CENTER): Payer: Medicare HMO | Admitting: Gastroenterology

## 2019-12-13 VITALS — BP 154/94 | HR 78 | Temp 97.3°F | Resp 14 | Ht 71.0 in | Wt 221.0 lb

## 2019-12-13 DIAGNOSIS — D122 Benign neoplasm of ascending colon: Secondary | ICD-10-CM | POA: Diagnosis not present

## 2019-12-13 DIAGNOSIS — Z1211 Encounter for screening for malignant neoplasm of colon: Secondary | ICD-10-CM

## 2019-12-13 DIAGNOSIS — D120 Benign neoplasm of cecum: Secondary | ICD-10-CM | POA: Diagnosis not present

## 2019-12-13 DIAGNOSIS — D124 Benign neoplasm of descending colon: Secondary | ICD-10-CM | POA: Diagnosis not present

## 2019-12-13 MED ORDER — SODIUM CHLORIDE 0.9 % IV SOLN: 500.0000 mL | Freq: Once | INTRAVENOUS | Status: DC

## 2019-12-13 NOTE — Op Note (Signed)
[facility] Patient Name: Allen Whitehead Procedure Date: 12/13/2019 9:07 AM MRN: [id number] Endoscopist: [name] , MD Age: 69 Referring MD:  Date of Birth: 02-12-1950 Gender: Male Account #: [id number] Procedure:                Colonoscopy Indications:              Screening for colorectal malignant neoplasm, This                            is the patient's first colonoscopy for colorectal                            cancer screening. Medicines:                Monitored Anesthesia Care Procedure:                Pre-Anesthesia Assessment:                           - Prior to the procedure, a History and Physical                            was performed, and patient medications and                            allergies were reviewed. The patient's tolerance of                            previous anesthesia was also reviewed. The risks                            and benefits of the procedure and the sedation                            options and risks were discussed with the patient.                            All questions were answered, and informed consent                            was obtained. Prior Anticoagulants: The patient has                            taken no previous anticoagulant or antiplatelet                            agents. ASA Grade Assessment: III - A patient with                            severe systemic disease. After reviewing the risks                            and benefits, the patient was deemed in  satisfactory condition to undergo the procedure.                           After obtaining informed consent, the colonoscope                            was passed under direct vision. Throughout the                            procedure, the patient's blood pressure, pulse, and                            oxygen saturations were monitored continuously. The                            Colonoscope was introduced through  the anus and                            advanced to the the cecum, identified by                            appendiceal orifice and ileocecal valve. The                            colonoscopy was performed without difficulty. The                            patient tolerated the procedure well. The quality                            of the bowel preparation was excellent. The                            ileocecal valve, appendiceal orifice, and rectum                            were photographed. Scope In: 9:15:40 AM Scope Out: 9:33:45 AM Scope Withdrawal Time: 0 hours 11 minutes 8 seconds  Total Procedure Duration: 0 hours 18 minutes 5 seconds  Findings:                 The perianal and digital rectal examinations were                            normal.                           Two sessile polyps were found in the descending                            colon and cecum. The polyps were 5 to 7 mm in size.                            These polyps were removed with a cold snare.  Resection and retrieval were complete.                           Two sessile polyps were found in the ascending                            colon and cecum. The polyps were 10 to 15 mm in                            size. These polyps were removed with a piecemeal                            technique using a cold snare. Resection and                            retrieval were complete.                           Four semi-sessile, granular lateral spreading                            polyps were found in the sigmoid colon, transverse                            colon and hepatic flexure. The polyps were large in                            size. Polypectomy was not attempted due to polyp                            size (too large to be excised), will plan for EMR                            to be scheduled at hospital.                           Non-bleeding internal hemorrhoids were found during                             retroflexion. The hemorrhoids were medium-sized. Complications:            No immediate complications. Estimated Blood Loss:     Estimated blood loss was minimal. Impression:               - Two 5 to 7 mm polyps in the descending colon and                            in the cecum, removed with a cold snare. Resected                            and retrieved.                           - Two 10 to 15 mm polyps in  the ascending colon and                            in the cecum, removed piecemeal using a cold snare.                            Resected and retrieved.                           - Four large polyps in the sigmoid colon, in the                            transverse colon and at the hepatic flexure.                            Resection not attempted.                           - Non-bleeding internal hemorrhoids. Recommendation:           - Patient has a contact number available for                            emergencies. The signs and symptoms of potential                            delayed complications were discussed with the                            patient. Return to normal activities tomorrow.                            Written discharge instructions were provided to the                            patient.                           - Resume previous diet.                           - Continue present medications.                           - Await pathology results.                           - Repeat colonoscopy at the next available                            appointment for EMR/polypectomy to be scheduled at                            hospital. [name], MD 12/13/2019 9:48:27 AM This report has been signed electronically.

## 2019-12-13 NOTE — Patient Instructions (Signed)
Handouts :  Polyps , hemorrhoids Resume previous diet Continue present medications Await pathology results Schedule next available appt for polypectomy to be done in hospital  YOU HAD AN ENDOSCOPIC PROCEDURE TODAY AT [facility]:   Refer to the procedure report that was given to you for any specific questions about what was found during the examination.  If the procedure report does not answer your questions, please call your gastroenterologist to clarify.  If you requested that your care partner not be given the details of your procedure findings, then the procedure report has been included in a sealed envelope for you to review at your convenience later.  YOU SHOULD EXPECT: Some feelings of bloating in the abdomen. Passage of more gas than usual.  Walking can help get rid of the air that was put into your GI tract during the procedure and reduce the bloating. If you had a lower endoscopy (such as a colonoscopy or flexible sigmoidoscopy) you may notice spotting of blood in your stool or on the toilet paper. If you underwent a bowel prep for your procedure, you may not have a normal bowel movement for a few days.  Please Note:  You might notice some irritation and congestion in your nose or some drainage.  This is from the oxygen used during your procedure.  There is no need for concern and it should clear up in a day or so.  SYMPTOMS TO REPORT IMMEDIATELY:   Following lower endoscopy (colonoscopy or flexible sigmoidoscopy):  Excessive amounts of blood in the stool  Significant tenderness or worsening of abdominal pains  Swelling of the abdomen that is new, acute  Fever of 100F or higher  For urgent or emergent issues, a gastroenterologist can be reached at any hour by calling [phone]. Do not use MyChart messaging for urgent concerns.   DIET:  We do recommend a small meal at first, but then you may proceed to your regular diet.  Drink plenty of fluids but you should  avoid alcoholic beverages for 24 hours.  ACTIVITY:  You should plan to take it easy for the rest of today and you should NOT DRIVE or use heavy machinery until tomorrow (because of the sedation medicines used during the test).    FOLLOW UP: Our staff will call the number listed on your records 48-72 hours following your procedure to check on you and address any questions or concerns that you may have regarding the information given to you following your procedure. If we do not reach you, we will leave a message.  We will attempt to reach you two times.  During this call, we will ask if you have developed any symptoms of COVID 19. If you develop any symptoms (ie: fever, flu-like symptoms, shortness of breath, cough etc.) before then, please call [phone].  If you test positive for Covid 19 in the 2 weeks post procedure, please call and report this information to [place].    If any biopsies were taken you will be contacted by phone or by letter within the next 1-3 weeks.  Please call us at [phone] if you have not heard about the biopsies in 3 weeks.   SIGNATURES/CONFIDENTIALITY: You and/or your care partner have signed paperwork which will be entered into your electronic medical record.  These signatures attest to the fact that that the information above on your After Visit Summary has been reviewed and is understood.  Full responsibility of the confidentiality of this discharge information  lies with you and/or your care-partner.

## 2019-12-13 NOTE — Progress Notes (Signed)
C.W. vital signs. 

## 2019-12-13 NOTE — Progress Notes (Signed)
Called to room to assist during endoscopic procedure.  Patient ID and intended procedure confirmed with present staff. Received instructions for my participation in the procedure from the performing physician.  

## 2019-12-13 NOTE — Progress Notes (Signed)
Pt's states no medical or surgical changes since previsit or office visit. 

## 2019-12-13 NOTE — Progress Notes (Signed)
pt tolerated well. VSS. awake and to recovery. Report given to RN.  

## 2019-12-14 ENCOUNTER — Other Ambulatory Visit: Payer: Self-pay

## 2019-12-14 ENCOUNTER — Telehealth: Payer: Self-pay | Admitting: *Deleted

## 2019-12-14 ENCOUNTER — Telehealth: Payer: Self-pay | Admitting: Gastroenterology

## 2019-12-14 DIAGNOSIS — D124 Benign neoplasm of descending colon: Secondary | ICD-10-CM

## 2019-12-14 DIAGNOSIS — D120 Benign neoplasm of cecum: Secondary | ICD-10-CM

## 2019-12-14 DIAGNOSIS — D122 Benign neoplasm of ascending colon: Secondary | ICD-10-CM

## 2019-12-14 NOTE — Telephone Encounter (Signed)
  Follow up Call-  Call back number 12/13/2019  Post procedure Call Back phone  # [phone]  Permission to leave phone message Yes  Some recent data might be hidden     Patient questions:  Do you have a fever, pain , or abdominal swelling? No. Pain Score  0 *  Have you tolerated food without any problems? Yes.    Have you been able to return to your normal activities? Yes.    Do you have any questions about your discharge instructions: Diet   No. Medications  No. Follow up visit  No.  Do you have questions or concerns about your Care? No.  Actions: * If pain score is 4 or above: No action needed, pain <4.  1. Have you developed a fever since your procedure? no  2.   Have you had an respiratory symptoms (SOB or cough) since your procedure? no  3.   Have you tested positive for COVID 19 since your procedure no  4.   Have you had any family members/close contacts diagnosed with the COVID 19 since your procedure?  no   If yes to any of these questions please route to Joylene John, RN and Joella Prince, RN

## 2019-12-22 DIAGNOSIS — Z6829 Body mass index (BMI) 29.0-29.9, adult: Secondary | ICD-10-CM | POA: Diagnosis not present

## 2019-12-22 DIAGNOSIS — R5383 Other fatigue: Secondary | ICD-10-CM | POA: Diagnosis not present

## 2019-12-22 DIAGNOSIS — M0609 Rheumatoid arthritis without rheumatoid factor, multiple sites: Secondary | ICD-10-CM | POA: Diagnosis not present

## 2019-12-22 DIAGNOSIS — E663 Overweight: Secondary | ICD-10-CM | POA: Diagnosis not present

## 2019-12-22 DIAGNOSIS — M150 Primary generalized (osteo)arthritis: Secondary | ICD-10-CM | POA: Diagnosis not present

## 2019-12-22 DIAGNOSIS — M2550 Pain in unspecified joint: Secondary | ICD-10-CM | POA: Diagnosis not present

## 2019-12-28 ENCOUNTER — Encounter: Payer: Self-pay | Admitting: Gastroenterology

## 2019-12-29 NOTE — Telephone Encounter (Signed)
No additional notes needed  

## 2020-01-28 ENCOUNTER — Other Ambulatory Visit: Payer: Self-pay | Admitting: Registered Nurse

## 2020-01-28 DIAGNOSIS — E119 Type 2 diabetes mellitus without complications: Secondary | ICD-10-CM

## 2020-02-01 ENCOUNTER — Telehealth: Payer: Self-pay | Admitting: Gastroenterology

## 2020-02-01 NOTE — Telephone Encounter (Signed)
Called patient back and encouraged him to go ahead with his scheduled Colonoscopy at [facility]. Let him know the hospital is using every precaution against COVID. He agreed

## 2020-02-01 NOTE — Telephone Encounter (Signed)
Inbound call from patient requesting a call back from the nurse please.  Is hesitate about having procedure done at this time; feels he might be exposed to covid.  Wants to know if procedure can be postponed a couple of months.  Please advise.

## 2020-02-03 ENCOUNTER — Encounter: Payer: Self-pay | Admitting: Registered Nurse

## 2020-02-03 NOTE — Progress Notes (Signed)
Established Patient Office Visit  Subjective:  Patient ID: Allen Whitehead, male    DOB: 11-30-1950  Age: 70 y.o. MRN: [id number]  CC:  Chief Complaint  Patient presents with  . Diabetes    pt has been working on diet and exercise, pt does need refill today but is asking for a paper rx so he can take it when he needs it rather than on the automated sytem     HPI Allen Whitehead presents for t2dm  Last A1c:  Lab Results  Component Value Date   HGBA1C 6.2 (A) 11/12/2019    Currently taking: .med No new complications Reports good compliance with medications Diet has been much improved Exercise habits have been more consistent now that he has est with rhuem and his pain is better controlled.  Overall feeling much better.   Past Medical History:  Diagnosis Date  . Arthritis   . Depression   . Diabetes mellitus without complication ([place])    pre DM   . GERD (gastroesophageal reflux disease)    past hx   . History of kidney stones   . Hypertension    no meds but elevated     Past Surgical History:  Procedure Laterality Date  . COLONOSCOPY     age 31 for blood in stool- Normal   . kidney stone retrieval    . lasar treatment on [place]    . VASECTOMY      Family History  Problem Relation Age of Onset  . Cancer Mother        unsure type   . Hypertension Mother   . Alzheimer's disease Mother   . Cancer Father   . Diabetes Father   . Lung cancer Father        + smoker   . Colon cancer Other   . Colon polyps Neg Hx   . Esophageal cancer Neg Hx   . Stomach cancer Neg Hx   . Rectal cancer Neg Hx     Social History   Socioeconomic History  . Marital status: Legally Separated    Spouse name: Not on file  . Number of children: Not on file  . Years of education: Not on file  . Highest education level: Not on file  Occupational History  . Not on file  Tobacco Use  . Smoking status: Current Every Day Smoker    Packs/day: 0.50    Types: Cigarettes  .  Smokeless tobacco: Never Used  Vaping Use  . Vaping Use: Never used  Substance and Sexual Activity  . Alcohol use: Yes    Comment: occasionally  . Drug use: No  . Sexual activity: Not on file  Other Topics Concern  . Not on file  Social History Narrative  . Not on file   Social Determinants of Health   Financial Resource Strain: Not on file  Food Insecurity: Not on file  Transportation Needs: Not on file  Physical Activity: Not on file  Stress: Not on file  Social Connections: Not on file  Intimate Partner Violence: Not on file    Outpatient Medications Prior to Visit  Medication Sig Dispense Refill  . celecoxib (CELEBREX) 100 MG capsule TAKE 1 CAPSULE BY MOUTH TWICE A DAY (Patient not taking: Reported on 11/29/2019) 90 capsule 0  . metFORMIN (GLUCOPHAGE) 500 MG tablet Take 1 tablet (500 mg total) by mouth daily with breakfast. 90 tablet 0  . Meclizine HCl (BONINE) 25 MG CHEW Chew 1 tablet (  25 mg total) by mouth 2 (two) times daily as needed (dizziness/vertigo). Reported on 05/13/2015 (Patient not taking: Reported on 10/12/2019) 90 tablet 0  . meloxicam (MOBIC) 15 MG tablet Take 15 mg by mouth daily. (Patient not taking: Reported on 10/12/2019)    . methocarbamol (ROBAXIN) 500 MG tablet Take 1 tablet (500 mg total) by mouth 3 (three) times daily. (Patient not taking: Reported on 10/12/2019) 40 tablet 0  . methylPREDNISolone (MEDROL) 4 MG tablet Take as directed (Patient not taking: Reported on 09/15/2019) 21 tablet 0  . naproxen sodium (ALEVE) 220 MG tablet Take 220 mg by mouth. (Patient not taking: Reported on 10/12/2019)    . tiZANidine (ZANAFLEX) 4 MG capsule Take 1 capsule (4 mg total) by mouth 3 (three) times daily. (Patient not taking: Reported on 10/12/2019) 30 capsule 0   No facility-administered medications prior to visit.    No Known Allergies  ROS Review of Systems  Constitutional: Negative.   HENT: Negative.   Eyes: Negative.   Respiratory: Negative.    Cardiovascular: Negative.   Gastrointestinal: Negative.   Genitourinary: Negative.   Musculoskeletal: Negative.   Skin: Negative.   Neurological: Negative.   Psychiatric/Behavioral: Negative.   All other systems reviewed and are negative.     Objective:    Physical Exam Constitutional:      General: He is not in acute distress.    Appearance: Normal appearance. He is normal weight. He is not ill-appearing, toxic-appearing or diaphoretic.  Cardiovascular:     Rate and Rhythm: Normal rate and regular rhythm.     Heart sounds: Normal heart sounds. No murmur heard. No friction rub. No gallop.   Pulmonary:     Effort: Pulmonary effort is normal. No respiratory distress.     Breath sounds: Normal breath sounds. No stridor. No wheezing, rhonchi or rales.  Chest:     Chest wall: No tenderness.  Neurological:     General: No focal deficit present.     Mental Status: He is alert and oriented to person, place, and time. Mental status is at baseline.  Psychiatric:        Mood and Affect: Mood normal.        Behavior: Behavior normal.        Thought Content: Thought content normal.        Judgment: Judgment normal.     BP (!) 142/88   Pulse 86   Temp 98.7 F (37.1 C) (Temporal)   Resp 16   Ht 5\' 11"  (1.803 m)   Wt 213 lb 3.2 oz (96.7 kg)   SpO2 98%   BMI 29.74 kg/m  Wt Readings from Last 3 Encounters:  12/13/19 221 lb (100.2 kg)  11/29/19 221 lb (100.2 kg)  11/12/19 213 lb 3.2 oz (96.7 kg)     Health Maintenance Due  Topic Date Due  . COVID-19 Vaccine (3 - Booster for Pfizer series) 09/30/2019    There are no preventive care reminders to display for this patient.  Lab Results  Component Value Date   TSH 0.920 08/09/2019   Lab Results  Component Value Date   WBC 10.6 08/09/2019   HGB 12.7 (L) 08/09/2019   HCT 37.5 08/09/2019   MCV 85 08/09/2019   PLT 386 08/09/2019   Lab Results  Component Value Date   NA 138 08/09/2019   K 5.0 08/09/2019   CO2 22  08/09/2019   GLUCOSE 130 (H) 08/09/2019   BUN 14 08/09/2019   CREATININE 0.76 08/09/2019  BILITOT 0.3 08/09/2019   ALKPHOS 91 08/09/2019   AST 21 08/09/2019   ALT 22 08/09/2019   PROT 7.6 08/09/2019   ALBUMIN 3.9 08/09/2019   CALCIUM 9.3 08/09/2019   ANIONGAP 14 11/13/2013   Lab Results  Component Value Date   CHOL 162 08/09/2019   Lab Results  Component Value Date   HDL 37 (L) 08/09/2019   Lab Results  Component Value Date   LDLCALC 107 (H) 08/09/2019   Lab Results  Component Value Date   TRIG 95 08/09/2019   Lab Results  Component Value Date   CHOLHDL 4.4 08/09/2019   Lab Results  Component Value Date   HGBA1C 6.2 (A) 11/12/2019      Assessment & Plan:   Problem List Items Addressed This Visit   None   Visit Diagnoses    Newly diagnosed diabetes ([place])    -  Primary   Relevant Orders   Microalbumin, urine (Completed)   POCT glycosylated hemoglobin (Hb A1C) (Completed)      Meds ordered this encounter  Medications  . DISCONTD: metFORMIN (GLUCOPHAGE) 500 MG tablet    Sig: Take 1 tablet (500 mg total) by mouth daily with breakfast.    Dispense:  90 tablet    Refill:  0    Order Specific Question:   Supervising Provider    Answer:   Carlota Raspberry, JEFFREY R [[id number]]    Follow-up: No follow-ups on file.   PLAN  Reduce meds. Refill  Labs collected. Will follow up with the patient as warranted.  Return in 6 mo  Patient encouraged to call clinic with any questions, comments, or concerns.  Maximiano Coss, NP

## 2020-02-07 ENCOUNTER — Ambulatory Visit (AMBULATORY_SURGERY_CENTER): Payer: Medicare HMO | Admitting: *Deleted

## 2020-02-07 ENCOUNTER — Encounter: Payer: Self-pay | Admitting: Gastroenterology

## 2020-02-07 ENCOUNTER — Other Ambulatory Visit: Payer: Self-pay

## 2020-02-07 VITALS — Ht 71.0 in | Wt 221.0 lb

## 2020-02-07 DIAGNOSIS — Z01818 Encounter for other preprocedural examination: Secondary | ICD-10-CM

## 2020-02-07 DIAGNOSIS — Z86010 Personal history of colonic polyps: Secondary | ICD-10-CM

## 2020-02-07 MED ORDER — PLENVU 140 G PO SOLR: 1.0000 | Freq: Once | ORAL | 0 refills | Status: AC

## 2020-02-07 NOTE — Progress Notes (Addendum)
covid test 02-11-20 at 3:05 pm  Pt's previsit is done over the phone and all paperwork (prep instructions, blank consent form to just read over, pre-procedure acknowledgement form and stamped envelope) sent to patient  Pt is aware that care partner will wait in the car during procedure; if they feel like they will be too hot or cold to wait in the car; they may wait in the 4 th floor lobby. Patient is aware to bring only one care partner. We want them to wear a mask (we do not have any that we can provide them), practice social distancing, and we will check their temperatures when they get here.  I did remind the patient that their care partner needs to stay in the parking lot the entire time and have a cell phone available, we will call them when the pt is ready for discharge. Patient will wear mask into building.   No trouble with anesthesia, denies being told they were difficult to intubate, or hx/fam hx of malignant hyperthermia per pt   No egg or soy allergy  No home oxygen use   No medications for weight loss taken   Pt denies constipation issues  Prep instructions and Plenvu coupon left on 3rd floor for pt to pick up.  Pt states he didn't get result letter from last colonoscopy- letter printed for pt as well

## 2020-02-11 ENCOUNTER — Encounter: Payer: Self-pay | Admitting: Gastroenterology

## 2020-02-11 ENCOUNTER — Other Ambulatory Visit (HOSPITAL_COMMUNITY): Admission: RE | Admit: 2020-02-11 | Payer: Medicare HMO | Source: Ambulatory Visit

## 2020-02-11 ENCOUNTER — Telehealth: Payer: Self-pay

## 2020-02-11 NOTE — Telephone Encounter (Signed)
Patient calls because he had difficulty finding the [facility] testing site. He was late and he was turned away because the site was closing. The testing site employees did ask him to come back in the morning for the Saturday hours starting at 9:00 am. The patient is concerned about the test not returning in time and his procedure will be cancelled. Explained that his procedure could be delayed, but would not be cancelled unless he was positive or he did not go for testing. The patient has complaints that the site was not well marked, it was difficult to find, the signs were "yard sale signs' and that he had no landmarks to go by in the directions given to him. I thanked the patient for his feedback and promised to share it with my supervisor.

## 2020-02-12 ENCOUNTER — Other Ambulatory Visit (HOSPITAL_COMMUNITY)
Admission: RE | Admit: 2020-02-12 | Discharge: 2020-02-12 | Disposition: A | Discharge status: Home / Self Care | Payer: Medicare HMO | Source: Ambulatory Visit | Attending: Gastroenterology | Admitting: Gastroenterology

## 2020-02-12 DIAGNOSIS — Z01812 Encounter for preprocedural laboratory examination: Secondary | ICD-10-CM | POA: Diagnosis not present

## 2020-02-12 DIAGNOSIS — Z20822 Contact with and (suspected) exposure to covid-19: Secondary | ICD-10-CM | POA: Diagnosis not present

## 2020-02-12 LAB — SARS CORONAVIRUS 2 (TAT 6-24 HRS): SARS Coronavirus 2: NEGATIVE

## 2020-02-14 NOTE — Telephone Encounter (Signed)
Thank you Beth.  I will forward to the testing staff.

## 2020-02-14 NOTE — Anesthesia Preprocedure Evaluation (Signed)
Anesthesia Evaluation  Patient identified by MRN, date of birth, ID band Patient awake    Reviewed: Allergy & Precautions, NPO status , Patient's Chart, lab work & pertinent test results  Airway Mallampati: II  TM Distance: >3 FB Neck ROM: Full    Dental no notable dental hx. (+) Teeth Intact, Dental Advisory Given   Pulmonary Current Smoker and Patient abstained from smoking.,    Pulmonary exam normal breath sounds clear to auscultation       Cardiovascular hypertension, Pt. on medications Normal cardiovascular exam Rhythm:Regular Rate:Normal     Neuro/Psych PSYCHIATRIC DISORDERS Depression negative neurological ROS     GI/Hepatic Neg liver ROS, GERD  ,  Endo/Other  diabetes, Well Controlled, Type 2, Oral Hypoglycemic Agents  Renal/GU Renal disease     Musculoskeletal  (+) Arthritis ,   Abdominal (+) + obese,   Peds  Hematology negative hematology ROS (+)   Anesthesia Other Findings   Reproductive/Obstetrics                            Anesthesia Physical Anesthesia Plan  ASA: III  Anesthesia Plan: MAC   Post-op Pain Management:    Induction:   PONV Risk Score and Plan: 2 and Treatment may vary due to age or medical condition and Ondansetron  Airway Management Planned: Nasal Cannula and Natural Airway  Additional Equipment: None  Intra-op Plan:   Post-operative Plan:   Informed Consent: I have reviewed the patients History and Physical, chart, labs and discussed the procedure including the risks, benefits and alternatives for the proposed anesthesia with the patient or authorized representative who has indicated his/her understanding and acceptance.     Dental advisory given  Plan Discussed with: CRNA  Anesthesia Plan Comments:        Anesthesia Quick Evaluation

## 2020-02-15 ENCOUNTER — Ambulatory Visit (HOSPITAL_COMMUNITY)
Admission: RE | Admit: 2020-02-15 | Discharge: 2020-02-15 | Disposition: A | Discharge status: Home / Self Care | Payer: Medicare HMO | Attending: Gastroenterology | Admitting: Gastroenterology

## 2020-02-15 ENCOUNTER — Encounter (HOSPITAL_COMMUNITY)
Admission: RE | Disposition: A | Discharge status: Home / Self Care | Payer: Self-pay | Source: Home / Self Care | Attending: Gastroenterology

## 2020-02-15 ENCOUNTER — Ambulatory Visit (HOSPITAL_COMMUNITY): Payer: Medicare HMO | Admitting: Anesthesiology

## 2020-02-15 ENCOUNTER — Encounter (HOSPITAL_COMMUNITY): Payer: Self-pay | Admitting: Gastroenterology

## 2020-02-15 DIAGNOSIS — D126 Benign neoplasm of colon, unspecified: Secondary | ICD-10-CM | POA: Diagnosis not present

## 2020-02-15 DIAGNOSIS — F17210 Nicotine dependence, cigarettes, uncomplicated: Secondary | ICD-10-CM | POA: Diagnosis not present

## 2020-02-15 DIAGNOSIS — K644 Residual hemorrhoidal skin tags: Secondary | ICD-10-CM | POA: Diagnosis not present

## 2020-02-15 DIAGNOSIS — D120 Benign neoplasm of cecum: Secondary | ICD-10-CM

## 2020-02-15 DIAGNOSIS — D123 Benign neoplasm of transverse colon: Secondary | ICD-10-CM

## 2020-02-15 DIAGNOSIS — D122 Benign neoplasm of ascending colon: Secondary | ICD-10-CM | POA: Diagnosis not present

## 2020-02-15 DIAGNOSIS — K621 Rectal polyp: Secondary | ICD-10-CM | POA: Diagnosis not present

## 2020-02-15 DIAGNOSIS — Z7984 Long term (current) use of oral hypoglycemic drugs: Secondary | ICD-10-CM | POA: Diagnosis not present

## 2020-02-15 DIAGNOSIS — Z86010 Personal history of colonic polyps: Secondary | ICD-10-CM

## 2020-02-15 DIAGNOSIS — Z791 Long term (current) use of non-steroidal anti-inflammatories (NSAID): Secondary | ICD-10-CM | POA: Insufficient documentation

## 2020-02-15 DIAGNOSIS — K635 Polyp of colon: Secondary | ICD-10-CM | POA: Diagnosis not present

## 2020-02-15 DIAGNOSIS — K648 Other hemorrhoids: Secondary | ICD-10-CM | POA: Insufficient documentation

## 2020-02-15 DIAGNOSIS — R69 Illness, unspecified: Secondary | ICD-10-CM | POA: Diagnosis not present

## 2020-02-15 DIAGNOSIS — D124 Benign neoplasm of descending colon: Secondary | ICD-10-CM

## 2020-02-15 HISTORY — PX: COLONOSCOPY WITH PROPOFOL: SHX5780

## 2020-02-15 HISTORY — PX: POLYPECTOMY: SHX5525

## 2020-02-15 HISTORY — PX: ENDOSCOPIC MUCOSAL RESECTION: SHX6839

## 2020-02-15 HISTORY — PX: BIOPSY: SHX5522

## 2020-02-15 HISTORY — PX: SUBMUCOSAL LIFTING INJECTION: SHX6855

## 2020-02-15 LAB — GLUCOSE, CAPILLARY: Glucose-Capillary: 97 mg/dL (ref 70–99)

## 2020-02-15 SURGERY — COLONOSCOPY WITH PROPOFOL
Anesthesia: Monitor Anesthesia Care

## 2020-02-15 MED ORDER — PROPOFOL 500 MG/50ML IV EMUL
INTRAVENOUS | Status: AC
  Filled 2020-02-15: qty 150

## 2020-02-15 MED ORDER — SODIUM CHLORIDE 0.9 % IV SOLN: INTRAVENOUS | Status: DC

## 2020-02-15 MED ORDER — LIDOCAINE HCL (CARDIAC) PF 100 MG/5ML IV SOSY
PREFILLED_SYRINGE | INTRAVENOUS | Status: DC | PRN
  Administered 2020-02-15: 100 mg via INTRAVENOUS

## 2020-02-15 MED ORDER — PROPOFOL 1000 MG/100ML IV EMUL
INTRAVENOUS | Status: AC
  Filled 2020-02-15: qty 100

## 2020-02-15 MED ORDER — PROPOFOL 10 MG/ML IV BOLUS
INTRAVENOUS | Status: AC
  Filled 2020-02-15: qty 20

## 2020-02-15 MED ORDER — LACTATED RINGERS IV SOLN: INTRAVENOUS | Status: DC

## 2020-02-15 MED ORDER — PROPOFOL 500 MG/50ML IV EMUL
INTRAVENOUS | Status: DC | PRN
  Administered 2020-02-15: 125 ug/kg/min via INTRAVENOUS

## 2020-02-15 MED ORDER — PROPOFOL 10 MG/ML IV BOLUS
INTRAVENOUS | Status: DC | PRN
  Administered 2020-02-15 (×2): 20 mg via INTRAVENOUS
  Administered 2020-02-15: 40 mg via INTRAVENOUS
  Administered 2020-02-15: 20 mg via INTRAVENOUS

## 2020-02-15 SURGICAL SUPPLY — 21 items

## 2020-02-15 NOTE — Transfer of Care (Signed)
Immediate Anesthesia Transfer of Care Note  Patient: Allen Whitehead  Procedure(s) Performed: COLONOSCOPY WITH PROPOFOL (N/A ) POLYPECTOMY BIOPSY SUBMUCOSAL LIFTING INJECTION  Patient Location: Endoscopy Unit  Anesthesia Type:MAC  Level of Consciousness: awake, drowsy and patient cooperative  Airway & Oxygen Therapy: Patient Spontanous Breathing  Post-op Assessment: Report given to RN and Post -op Vital signs reviewed and stable  Post vital signs: Reviewed and stable  Last Vitals:  Vitals Value Taken Time  BP 163/87 1032  Temp    Pulse 83 02/15/20 1032  Resp 17 02/15/20 1032  SpO2 98 % 02/15/20 1032  Vitals shown include unvalidated device data.  Last Pain:  Vitals:   02/15/20 0823  TempSrc: Temporal  PainSc: 1          Complications: No complications documented.

## 2020-02-15 NOTE — Anesthesia Postprocedure Evaluation (Signed)
Anesthesia Post Note  Patient: Allen Whitehead  Procedure(s) Performed: COLONOSCOPY WITH PROPOFOL (N/A ) POLYPECTOMY BIOPSY SUBMUCOSAL LIFTING INJECTION ENDOSCOPIC MUCOSAL RESECTION     Patient location during evaluation: Endoscopy Anesthesia Type: MAC Level of consciousness: awake and alert Pain management: pain level controlled Vital Signs Assessment: post-procedure vital signs reviewed and stable Respiratory status: spontaneous breathing, nonlabored ventilation, respiratory function stable and patient connected to nasal cannula oxygen Cardiovascular status: blood pressure returned to baseline and stable Postop Assessment: no apparent nausea or vomiting Anesthetic complications: no   No complications documented.  Last Vitals:  Vitals:   02/15/20 1050 02/15/20 1100  BP: (!) 183/103 (!) 163/94  Pulse: 79 77  Resp: 12 14  Temp:    SpO2: 97% 99%    Last Pain:  Vitals:   02/15/20 1100  TempSrc:   PainSc: 0-No pain                 Barnet Glasgow

## 2020-02-15 NOTE — Op Note (Addendum)
[facility] Patient Name: Allen Whitehead Procedure Date: 02/15/2020 MRN: [id number] Attending MD: [name] , MD Date of Birth: 21-Dec-1950 CSN: [id number] Age: 70 Admit Type: Outpatient Procedure:                Colonoscopy Indications:              Excision of colonic polyps, large lateral spreading                            polyps removal with EMR Providers:                [name], MD, Cleda Daub, RN, Benetta Spar, Technician, Herbie Drape, CRNA Referring MD:              Medicines:                Monitored Anesthesia Care Complications:            No immediate complications. Estimated Blood Loss:     Estimated blood loss was minimal. Procedure:                Pre-Anesthesia Assessment:                           - Prior to the procedure, a History and Physical                            was performed, and patient medications and                            allergies were reviewed. The patient's tolerance of                            previous anesthesia was also reviewed. The risks                            and benefits of the procedure and the sedation                            options and risks were discussed with the patient.                            All questions were answered, and informed consent                            was obtained. Prior Anticoagulants: The patient has                            taken no previous anticoagulant or antiplatelet                            agents. ASA Grade Assessment: II - A patient with  mild systemic disease. After reviewing the risks                            and benefits, the patient was deemed in                            satisfactory condition to undergo the procedure.                           After obtaining informed consent, the colonoscope                            was passed under direct vision. Throughout the                             procedure, the patient's blood pressure, pulse, and                            oxygen saturations were monitored continuously. The                            [id number]) Olympus pediatric colonscope                            was introduced through the anus and advanced to the                            the terminal ileum, with identification of the                            appendiceal orifice and IC valve. The colonoscopy                            was performed without difficulty. The patient                            tolerated the procedure well. The quality of the                            bowel preparation was excellent. The terminal                            ileum, ileocecal valve, appendiceal orifice, and                            rectum were photographed. Scope In: 9:27:02 AM Scope Out: 10:25:10 AM Scope Withdrawal Time: 0 hours 50 minutes 56 seconds  Total Procedure Duration: 0 hours 58 minutes 8 seconds  Findings:      The perianal and digital rectal examinations were normal.      Two sessile polyps were found in the ascending colon. The polyps were 1       to 2 mm in size. These polyps were removed with a jumbo cold forceps.       Resection and retrieval were complete.  Five sessile polyps were found in the rectum X2, transverse colon X2 and       ascending colon X1. The polyps were 4 to 6 mm in size. These polyps were       removed with a cold snare. Resection and retrieval were complete.      Four semi-pedunculated polyps were found in the rectum X1, sigmoid colon       X1, descending colon X1 and transverse colon X1. The polyps were 9 to 12       mm in size. These polyps were removed with a hot snare. Resection and       retrieval were complete.      Three carpet-like polyps were found in the transverse colon X2 and       hepatic flexure X1. The polyps were 17 to 30 mm in size. Preparations       were made for mucosal resection. 10 cc Orise gel  was injected to raise       the lesions and was delineated. Piecemeal mucosal resection using a hot       snare was performed. Resection and retrieval were complete.      Non-bleeding external and internal hemorrhoids were found during       retroflexion. The hemorrhoids were small. Impression:               - Two 1 to 2 mm polyps in the ascending colon,                            removed with a jumbo cold forceps. Resected and                            retrieved.                           - Five 4 to 6 mm polyps in the rectum, in the                            transverse colon and in the ascending colon,                            removed with a cold snare. Resected and retrieved.                           - Four 9 to 12 mm polyps in the rectum, in the                            sigmoid colon, in the descending colon and in the                            transverse colon, removed with a hot snare.                            Resected and retrieved.                           - Three 17 to 30 mm polyps in the transverse colon  and at the hepatic flexure, removed with mucosal                            resection. Resected and retrieved.                           - Non-bleeding external and internal hemorrhoids.                           - Mucosal resection was performed. Resection and                            retrieval were complete. Moderate Sedation:      Not Applicable - Patient had care per Anesthesia. Recommendation:           - Patient has a contact number available for                            emergencies. The signs and symptoms of potential                            delayed complications were discussed with the                            patient. Return to normal activities tomorrow.                            Written discharge instructions were provided to the                            patient.                           - Resume previous diet.                            - Continue present medications.                           - No aspirin, ibuprofen, naproxen, or other                            non-steroidal anti-inflammatory drugs for 2 weeks.                           - Await pathology results.                           - Repeat colonoscopy in 1 year for surveillance                            based on pathology results. Procedure Code(s):        --- Professional ---                           [phone], Colonoscopy, flexible; with endoscopic  mucosal resection                           45385, 59, Colonoscopy, flexible; with removal of                            tumor(s), polyp(s), or other lesion(s) by snare                            technique                           45380, 52, Colonoscopy, flexible; with biopsy,                            single or multiple Diagnosis Code(s):        --- Professional ---                           K62.1, Rectal polyp                           K63.5, Polyp of colon                           K64.8, Other hemorrhoids CPT copyright 2019 American Medical Association. All rights reserved. The codes documented in this report are preliminary and upon coder review may  be revised to meet current compliance requirements. [name], MD 02/15/2020 10:40:26 AM This report has been signed electronically. Number of Addenda: 0

## 2020-02-15 NOTE — Discharge Instructions (Signed)

## 2020-02-15 NOTE — H&P (Signed)
[facility] History and Physical   Primary Care Physician:  Maximiano Coss, NP   Reason for Procedure:   Polypectomy by EMR technique  Plan:    Colonoscopy with polypectomies     HPI: Allen Whitehead is a 70 y.o. male with multiple large lateral spreading polyps here for colonoscopy with polypectomies The risks and benefits as well as alternatives of endoscopic procedure(s) have been discussed and reviewed. All questions answered. The patient agrees to proceed.    Past Medical History:  Diagnosis Date  . Arthritis   . Depression   . Diabetes mellitus without complication ([place])    pre DM   . GERD (gastroesophageal reflux disease)    past hx   . History of kidney stones   . Hypertension    no meds but elevated   . Kidney stones     Past Surgical History:  Procedure Laterality Date  . COLONOSCOPY     age 89 for blood in stool- Normal   . kidney stone retrieval    . lasar treatment on Uvula    . VASECTOMY      Prior to Admission medications   Medication Sig Start Date End Date Taking? Authorizing Provider  celecoxib (CELEBREX) 200 MG capsule Take 200 mg by mouth 2 (two) times daily. 10/11/19  Yes [provider]  hydroxychloroquine (PLAQUENIL) 200 MG tablet Take 200 mg by mouth 2 (two) times daily. 01/20/20  Yes [provider]  metFORMIN (GLUCOPHAGE) 500 MG tablet TAKE 1 TABLET BY MOUTH EVERY DAY WITH BREAKFAST Patient taking differently: Take 500 mg by mouth daily with breakfast. 01/28/20  Yes Maximiano Coss, NP    Current Facility-Administered Medications  Medication Dose Route Frequency Provider Last Rate Last Admin  . lactated ringers infusion   Intravenous Continuous [name], MD 10 mL/hr at 02/15/20 0835 New Bag at 02/15/20 0835    Allergies as of 12/14/2019  . (No Known Allergies)    Family History  Problem Relation Age of Onset  . Cancer Mother        unsure type   . Hypertension Mother   . Alzheimer's  disease Mother   . Cancer Father   . Diabetes Father   . Lung cancer Father        + smoker   . Colon cancer Other   . Colon polyps Neg Hx   . Esophageal cancer Neg Hx   . Stomach cancer Neg Hx   . Rectal cancer Neg Hx     Social History   Socioeconomic History  . Marital status: Legally Separated    Spouse name: Not on file  . Number of children: Not on file  . Years of education: Not on file  . Highest education level: Not on file  Occupational History  . Not on file  Tobacco Use  . Smoking status: Current Every Day Smoker    Packs/day: 0.50    Types: Cigarettes  . Smokeless tobacco: Never Used  Vaping Use  . Vaping Use: Never used  Substance and Sexual Activity  . Alcohol use: Yes    Comment: occasionally  . Drug use: No  . Sexual activity: Not on file  Other Topics Concern  . Not on file  Social History Narrative  . Not on file   Social Determinants of Health   Financial Resource Strain: Not on file  Food Insecurity: Not on file  Transportation Needs: Not on file  Physical Activity: Not on file  Stress:  Not on file  Social Connections: Not on file  Intimate Partner Violence: Not on file    Review of Systems:  All other review of systems negative except as mentioned in the HPI.  Physical Exam: Vital signs in last 24 hours: Temp:  [97.7 F (36.5 C)] 97.7 F (36.5 C) (01/25 0823) Pulse Rate:  [96] 96 (01/25 0823) Resp:  [17] 17 (01/25 0823) BP: (188)/(72) 188/72 (01/25 0823) SpO2:  [99 %] 99 % (01/25 0823) Weight:  [99.8 kg] 99.8 kg (01/25 0823)   General:   Alert, NAD Lungs:  Clear .   Heart:  Regular rate and rhythm Abdomen:  Soft, nontender and nondistended. Neuro/Psych:  Alert and cooperative. Normal mood and affect. A and O x 3   K. Denzil Magnuson , MD [phone]

## 2020-02-16 ENCOUNTER — Encounter (HOSPITAL_COMMUNITY): Payer: Self-pay | Admitting: Gastroenterology

## 2020-02-16 LAB — SURGICAL PATHOLOGY

## 2020-03-06 ENCOUNTER — Encounter: Payer: Self-pay | Admitting: Gastroenterology

## 2020-03-09 ENCOUNTER — Telehealth: Payer: Self-pay | Admitting: Gastroenterology

## 2020-03-09 NOTE — Telephone Encounter (Signed)
Patient advised his polyps were precancerous and his recall will be in 1 year. Very nice patient. Thanks Lyerly for "being so nice" to him.

## 2020-03-09 NOTE — Telephone Encounter (Signed)
Pt is requesting a call back from a nurse to discuss his pathology report from his colonoscopy.

## 2020-03-30 DIAGNOSIS — M0609 Rheumatoid arthritis without rheumatoid factor, multiple sites: Secondary | ICD-10-CM | POA: Diagnosis not present

## 2020-03-30 DIAGNOSIS — E669 Obesity, unspecified: Secondary | ICD-10-CM | POA: Diagnosis not present

## 2020-03-30 DIAGNOSIS — M150 Primary generalized (osteo)arthritis: Secondary | ICD-10-CM | POA: Diagnosis not present

## 2020-03-30 DIAGNOSIS — M2550 Pain in unspecified joint: Secondary | ICD-10-CM | POA: Diagnosis not present

## 2020-03-30 DIAGNOSIS — Z6832 Body mass index (BMI) 32.0-32.9, adult: Secondary | ICD-10-CM | POA: Diagnosis not present

## 2020-04-14 DIAGNOSIS — R030 Elevated blood-pressure reading, without diagnosis of hypertension: Secondary | ICD-10-CM | POA: Diagnosis not present

## 2020-04-14 DIAGNOSIS — M069 Rheumatoid arthritis, unspecified: Secondary | ICD-10-CM | POA: Diagnosis not present

## 2020-04-14 DIAGNOSIS — G8929 Other chronic pain: Secondary | ICD-10-CM | POA: Diagnosis not present

## 2020-04-14 DIAGNOSIS — I951 Orthostatic hypotension: Secondary | ICD-10-CM | POA: Diagnosis not present

## 2020-04-14 DIAGNOSIS — M1990 Unspecified osteoarthritis, unspecified site: Secondary | ICD-10-CM | POA: Diagnosis not present

## 2020-04-14 DIAGNOSIS — E119 Type 2 diabetes mellitus without complications: Secondary | ICD-10-CM | POA: Diagnosis not present

## 2020-04-14 DIAGNOSIS — R69 Illness, unspecified: Secondary | ICD-10-CM | POA: Diagnosis not present

## 2020-04-14 DIAGNOSIS — E669 Obesity, unspecified: Secondary | ICD-10-CM | POA: Diagnosis not present

## 2020-04-14 DIAGNOSIS — Z6832 Body mass index (BMI) 32.0-32.9, adult: Secondary | ICD-10-CM | POA: Diagnosis not present

## 2020-04-14 DIAGNOSIS — R42 Dizziness and giddiness: Secondary | ICD-10-CM | POA: Diagnosis not present

## 2020-04-25 ENCOUNTER — Other Ambulatory Visit: Payer: Self-pay | Admitting: Registered Nurse

## 2020-04-25 DIAGNOSIS — E119 Type 2 diabetes mellitus without complications: Secondary | ICD-10-CM

## 2020-05-10 ENCOUNTER — Encounter: Payer: Self-pay | Admitting: Registered Nurse

## 2020-05-10 ENCOUNTER — Ambulatory Visit (INDEPENDENT_AMBULATORY_CARE_PROVIDER_SITE_OTHER): Payer: Medicare HMO | Admitting: Registered Nurse

## 2020-05-10 ENCOUNTER — Ambulatory Visit: Payer: Self-pay | Admitting: Registered Nurse

## 2020-05-10 ENCOUNTER — Other Ambulatory Visit: Payer: Self-pay

## 2020-05-10 VITALS — BP 132/76 | HR 84 | Temp 98.3°F | Resp 16 | Ht 71.0 in | Wt 218.6 lb

## 2020-05-10 DIAGNOSIS — Z13228 Encounter for screening for other metabolic disorders: Secondary | ICD-10-CM | POA: Diagnosis not present

## 2020-05-10 DIAGNOSIS — Z13220 Encounter for screening for lipoid disorders: Secondary | ICD-10-CM | POA: Diagnosis not present

## 2020-05-10 DIAGNOSIS — Z1329 Encounter for screening for other suspected endocrine disorder: Secondary | ICD-10-CM | POA: Diagnosis not present

## 2020-05-10 DIAGNOSIS — E119 Type 2 diabetes mellitus without complications: Secondary | ICD-10-CM

## 2020-05-10 DIAGNOSIS — H8110 Benign paroxysmal vertigo, unspecified ear: Secondary | ICD-10-CM | POA: Diagnosis not present

## 2020-05-10 DIAGNOSIS — Z130 Encounter for screening for diseases of the blood and blood-forming organs and certain disorders involving the immune mechanism: Secondary | ICD-10-CM | POA: Diagnosis not present

## 2020-05-10 MED ORDER — MECLIZINE HCL 25 MG PO TABS: 25.0000 mg | ORAL_TABLET | Freq: Three times a day (TID) | ORAL | 2 refills | Status: DC | PRN

## 2020-05-10 MED ORDER — METFORMIN HCL 500 MG PO TABS: 500.0000 mg | ORAL_TABLET | Freq: Every day | ORAL | 1 refills | Status: DC

## 2020-05-10 NOTE — Patient Instructions (Signed)
Mr Episcopo -   Always a pleasure to see you.  Labs will be back by tomorrow. I'll call you if I have any concerns. We are checking: blood counts, liver and kidney function, sugars, cholesterol, and thyroid  I have sent refills on the meclizine and metformin to your pharmacy  See you in 6 mo, sooner if you have concerns  Rich

## 2020-05-11 ENCOUNTER — Encounter: Payer: Self-pay | Admitting: Registered Nurse

## 2020-05-11 LAB — CBC WITH DIFFERENTIAL/PLATELET
Basophils Absolute: 0 10*3/uL (ref 0.0–0.1)
Basophils Relative: 0.6 % (ref 0.0–3.0)
Eosinophils Absolute: 0.1 10*3/uL (ref 0.0–0.7)
Eosinophils Relative: 1.2 % (ref 0.0–5.0)
HCT: 44.9 % (ref 39.0–52.0)
Hemoglobin: 15.1 g/dL (ref 13.0–17.0)
Lymphocytes Relative: 27.5 % (ref 12.0–46.0)
Lymphs Abs: 2.1 10*3/uL (ref 0.7–4.0)
MCHC: 33.7 g/dL (ref 30.0–36.0)
MCV: 87.1 fl (ref 78.0–100.0)
Monocytes Absolute: 0.5 10*3/uL (ref 0.1–1.0)
Monocytes Relative: 7.1 % (ref 3.0–12.0)
Neutro Abs: 4.8 10*3/uL (ref 1.4–7.7)
Neutrophils Relative %: 63.6 % (ref 43.0–77.0)
Platelets: 234 10*3/uL (ref 150.0–400.0)
RBC: 5.15 Mil/uL (ref 4.22–5.81)
RDW: 14.3 % (ref 11.5–15.5)
WBC: 7.5 10*3/uL (ref 4.0–10.5)

## 2020-05-11 LAB — LIPID PANEL
Cholesterol: 165 mg/dL (ref 0–200)
HDL: 37.3 mg/dL — ABNORMAL LOW (ref >39.00)
LDL Cholesterol: 106 mg/dL — ABNORMAL HIGH (ref 0–99)
NonHDL: 127.44
Total CHOL/HDL Ratio: 4
Triglycerides: 106 mg/dL (ref 0.0–149.0)
VLDL: 21.2 mg/dL (ref 0.0–40.0)

## 2020-05-11 LAB — COMPREHENSIVE METABOLIC PANEL
ALT: 19 U/L (ref 0–53)
AST: 25 U/L (ref 0–37)
Albumin: 4.2 g/dL (ref 3.5–5.2)
Alkaline Phosphatase: 73 U/L (ref 39–117)
BUN: 12 mg/dL (ref 6–23)
CO2: 26 mEq/L (ref 19–32)
Calcium: 9.8 mg/dL (ref 8.4–10.5)
Chloride: 101 mEq/L (ref 96–112)
Creatinine, Ser: 0.86 mg/dL (ref 0.40–1.50)
GFR: 89.19 mL/min (ref >60.00)
Glucose, Bld: 77 mg/dL (ref 70–99)
Potassium: 4.2 mEq/L (ref 3.5–5.1)
Sodium: 136 mEq/L (ref 135–145)
Total Bilirubin: 0.4 mg/dL (ref 0.2–1.2)
Total Protein: 7.9 g/dL (ref 6.0–8.3)

## 2020-05-11 LAB — HEMOGLOBIN A1C: Hgb A1c MFr Bld: 6.4 % (ref 4.6–6.5)

## 2020-05-11 LAB — TSH: TSH: 1.67 u[IU]/mL (ref 0.35–4.50)

## 2020-06-16 ENCOUNTER — Telehealth: Payer: Self-pay | Admitting: Registered Nurse

## 2020-06-16 NOTE — Telephone Encounter (Signed)
Left message for patient to schedule Annual Wellness Visit.  Please schedule with Nurse Health Advisor Martha Stanley, RN at [place]  

## 2020-07-06 DIAGNOSIS — M150 Primary generalized (osteo)arthritis: Secondary | ICD-10-CM | POA: Diagnosis not present

## 2020-07-06 DIAGNOSIS — R609 Edema, unspecified: Secondary | ICD-10-CM | POA: Diagnosis not present

## 2020-07-06 DIAGNOSIS — E663 Overweight: Secondary | ICD-10-CM | POA: Diagnosis not present

## 2020-07-06 DIAGNOSIS — Z6829 Body mass index (BMI) 29.0-29.9, adult: Secondary | ICD-10-CM | POA: Diagnosis not present

## 2020-07-06 DIAGNOSIS — M2550 Pain in unspecified joint: Secondary | ICD-10-CM | POA: Diagnosis not present

## 2020-07-06 DIAGNOSIS — M0609 Rheumatoid arthritis without rheumatoid factor, multiple sites: Secondary | ICD-10-CM | POA: Diagnosis not present

## 2020-10-03 LAB — HM DIABETES EYE EXAM

## 2020-10-18 ENCOUNTER — Telehealth: Payer: Self-pay

## 2020-10-18 NOTE — Telephone Encounter (Signed)
Patient is in our office asking for permission to switch care to our office due to location.

## 2020-10-19 NOTE — Telephone Encounter (Signed)
Fine by me! He's a great guy.   Good luck to him going forward.

## 2020-10-26 NOTE — Telephone Encounter (Signed)
Called patient to schedule the TOC. Appt set for 01/03/21 with Dr. Ronnald Ramp

## 2020-11-08 ENCOUNTER — Ambulatory Visit: Payer: Medicare HMO | Admitting: Registered Nurse

## 2020-11-13 NOTE — Progress Notes (Signed)
Established Patient Office Visit  Subjective:  Patient ID: Allen Whitehead, male    DOB: 04/20/1950  Age: 70 y.o. MRN: [id number]  CC:  Chief Complaint  Patient presents with   Diabetes    Pt has not changed his diet, notes he is limited on mobility, ready for lab work    HPI [name] presents for follow up on t2dm  Last A1c:  Lab Results  Component Value Date   HGBA1C 6.4 05/10/2020    Currently taking: metformin 500mg  po bid ac No new complications Reports good compliance with medications Diet has been stable Exercise habits have been limited due to pain. Continues to follow with rheum. Things getting better.  Does note some dizziness Positional, mostly when turning head No head injury Has not happened before  Past Medical History:  Diagnosis Date   Arthritis    Depression    Diabetes mellitus without complication ([facility])    pre DM    GERD (gastroesophageal reflux disease)    past hx    History of kidney stones    Hypertension    no meds but elevated    Kidney stones     Past Surgical History:  Procedure Laterality Date   BIOPSY  02/15/2020   Procedure: BIOPSY;  Surgeon: [name], MD;  Location: [facility];  Service: Endoscopy;;   COLONOSCOPY     age 8 for blood in stool- Normal    COLONOSCOPY WITH PROPOFOL N/A 02/15/2020   Procedure: COLONOSCOPY WITH PROPOFOL;  Surgeon: [name], MD;  Location: [facility];  Service: Endoscopy;  Laterality: N/A;   ENDOSCOPIC MUCOSAL RESECTION  02/15/2020   Procedure: ENDOSCOPIC MUCOSAL RESECTION;  Surgeon: [name], MD;  Location: [facility];  Service: Endoscopy;;   kidney stone retrieval     lasar treatment on Uvula     POLYPECTOMY  02/15/2020   Procedure: POLYPECTOMY;  Surgeon: [name], MD;  Location: [facility];  Service: Endoscopy;;   SUBMUCOSAL LIFTING INJECTION  02/15/2020   Procedure: SUBMUCOSAL LIFTING INJECTION;  Surgeon: [name], MD;   Location: [facility];  Service: Endoscopy;;   VASECTOMY      Family History  Problem Relation Age of Onset   Cancer Mother        unsure type    Hypertension Mother    Alzheimer's disease Mother    Cancer Father    Diabetes Father    Lung cancer Father        + smoker    Colon cancer Other    Colon polyps Neg Hx    Esophageal cancer Neg Hx    Stomach cancer Neg Hx    Rectal cancer Neg Hx     Social History   Socioeconomic History   Marital status: Legally Separated    Spouse name: Not on file   Number of children: Not on file   Years of education: Not on file   Highest education level: Not on file  Occupational History   Not on file  Tobacco Use   Smoking status: Every Day    Packs/day: 0.50    Types: Cigarettes   Smokeless tobacco: Never  Vaping Use   Vaping Use: Never used  Substance and Sexual Activity   Alcohol use: Yes    Comment: occasionally   Drug use: No   Sexual activity: Not on file  Other Topics Concern   Not on file  Social History Narrative   Not on  file   Social Determinants of Health   Financial Resource Strain: Not on file  Food Insecurity: Not on file  Transportation Needs: Not on file  Physical Activity: Not on file  Stress: Not on file  Social Connections: Not on file  Intimate Partner Violence: Not on file    Outpatient Medications Prior to Visit  Medication Sig Dispense Refill   hydroxychloroquine (PLAQUENIL) 200 MG tablet Take 200 mg by mouth 2 (two) times daily.     metFORMIN (GLUCOPHAGE) 500 MG tablet Take 1 tablet (500 mg total) by mouth daily with breakfast. 90 tablet 1   celecoxib (CELEBREX) 200 MG capsule Take 1 capsule by mouth 2 (two) times daily.     No facility-administered medications prior to visit.    No Known Allergies  ROS Review of Systems  Constitutional: Negative.   HENT: Negative.    Eyes: Negative.   Respiratory: Negative.    Cardiovascular: Negative.   Gastrointestinal: Negative.    Genitourinary: Negative.   Musculoskeletal: Negative.   Skin: Negative.   Neurological: Negative.   Psychiatric/Behavioral: Negative.    All other systems reviewed and are negative.    Objective:    Physical Exam Constitutional:      General: He is not in acute distress.    Appearance: Normal appearance. He is normal weight. He is not ill-appearing, toxic-appearing or diaphoretic.  Cardiovascular:     Rate and Rhythm: Normal rate and regular rhythm.     Heart sounds: Normal heart sounds. No murmur heard.   No friction rub. No gallop.  Pulmonary:     Effort: Pulmonary effort is normal. No respiratory distress.     Breath sounds: Normal breath sounds. No stridor. No wheezing, rhonchi or rales.  Chest:     Chest wall: No tenderness.  Neurological:     General: No focal deficit present.     Mental Status: He is alert and oriented to person, place, and time. Mental status is at baseline.     Comments: Mild horizontal nystagmus  Psychiatric:        Mood and Affect: Mood normal.        Behavior: Behavior normal.        Thought Content: Thought content normal.        Judgment: Judgment normal.    BP 132/76   Pulse 84   Temp 98.3 F (36.8 C) (Temporal)   Resp 16   Ht 5\' 11"  (1.803 m)   Wt 218 lb 9.6 oz (99.2 kg)   SpO2 98%   BMI 30.49 kg/m  Wt Readings from Last 3 Encounters:  05/10/20 218 lb 9.6 oz (99.2 kg)  02/15/20 220 lb (99.8 kg)  02/07/20 221 lb (100.2 kg)     Health Maintenance Due  Topic Date Due   Pneumonia Vaccine 74+ Years old (1 - PCV) Never done   Hepatitis C Screening  Never done   TETANUS/TDAP  Never done   Zoster Vaccines- Shingrix (1 of 2) Never done   COVID-19 Vaccine (3 - Booster for Pfizer series) 05/25/2019   INFLUENZA VACCINE  Never done   URINE MICROALBUMIN  11/11/2020    There are no preventive care reminders to display for this patient.  Lab Results  Component Value Date   TSH 1.67 05/10/2020   Lab Results  Component Value Date    WBC 7.5 05/10/2020   HGB 15.1 05/10/2020   HCT 44.9 05/10/2020   MCV 87.1 05/10/2020   PLT 234.0 05/10/2020  Lab Results  Component Value Date   NA 136 05/10/2020   K 4.2 05/10/2020   CO2 26 05/10/2020   GLUCOSE 77 05/10/2020   BUN 12 05/10/2020   CREATININE 0.86 05/10/2020   BILITOT 0.4 05/10/2020   ALKPHOS 73 05/10/2020   AST 25 05/10/2020   ALT 19 05/10/2020   PROT 7.9 05/10/2020   ALBUMIN 4.2 05/10/2020   CALCIUM 9.8 05/10/2020   ANIONGAP 14 11/13/2013   GFR 89.19 05/10/2020   Lab Results  Component Value Date   CHOL 165 05/10/2020   Lab Results  Component Value Date   HDL 37.30 (L) 05/10/2020   Lab Results  Component Value Date   LDLCALC 106 (H) 05/10/2020   Lab Results  Component Value Date   TRIG 106.0 05/10/2020   Lab Results  Component Value Date   CHOLHDL 4 05/10/2020   Lab Results  Component Value Date   HGBA1C 6.4 05/10/2020      Assessment & Plan:   Problem List Items Addressed This Visit   None Visit Diagnoses     Benign paroxysmal positional vertigo, unspecified laterality    -  Primary   Relevant Medications   meclizine (ANTIVERT) 25 MG tablet   Newly diagnosed diabetes ([place])       Relevant Medications   metFORMIN (GLUCOPHAGE) 500 MG tablet   Other Relevant Orders   Hemoglobin A1c (Completed)   Lipid panel (Completed)   Screening for endocrine, metabolic and immunity disorder       Relevant Orders   Comprehensive metabolic panel (Completed)   Hemoglobin A1c (Completed)   CBC with Differential/Platelet (Completed)   TSH (Completed)   Lipid screening       Relevant Orders   Lipid panel (Completed)       Meds ordered this encounter  Medications   meclizine (ANTIVERT) 25 MG tablet    Sig: Take 1 tablet (25 mg total) by mouth 3 (three) times daily as needed for dizziness.    Dispense:  60 tablet    Refill:  2   metFORMIN (GLUCOPHAGE) 500 MG tablet    Sig: Take 1 tablet (500 mg total) by mouth daily with breakfast.     Dispense:  90 tablet    Refill:  1    Follow-up: Return in about 6 months (around 11/09/2020) for t2dm and lipids.   PLAN Suspect bppv. Will give meclizine. Reviewed modified epley for at home use. If worsening will return to clinic. A1c improved. Can reduce metformin down to 500mg  po qd ac.  Labs collected. Will follow up with the patient as warranted. He notes he will look to est closer to his home - offered to provide refills until that time and visits should he need anything Patient encouraged to call clinic with any questions, comments, or concerns.  Maximiano Coss, NP

## 2021-01-03 ENCOUNTER — Ambulatory Visit (INDEPENDENT_AMBULATORY_CARE_PROVIDER_SITE_OTHER): Payer: Medicare HMO | Admitting: Internal Medicine

## 2021-01-03 ENCOUNTER — Other Ambulatory Visit: Payer: Self-pay

## 2021-01-03 ENCOUNTER — Encounter: Payer: Self-pay | Admitting: Internal Medicine

## 2021-01-03 VITALS — BP 176/110 | HR 96 | Temp 98.4°F | Resp 16 | Ht 71.0 in | Wt 213.0 lb

## 2021-01-03 DIAGNOSIS — Z0001 Encounter for general adult medical examination with abnormal findings: Secondary | ICD-10-CM | POA: Diagnosis not present

## 2021-01-03 DIAGNOSIS — R7989 Other specified abnormal findings of blood chemistry: Secondary | ICD-10-CM | POA: Diagnosis not present

## 2021-01-03 DIAGNOSIS — E785 Hyperlipidemia, unspecified: Secondary | ICD-10-CM | POA: Diagnosis not present

## 2021-01-03 DIAGNOSIS — I119 Hypertensive heart disease without heart failure: Secondary | ICD-10-CM

## 2021-01-03 DIAGNOSIS — R0989 Other specified symptoms and signs involving the circulatory and respiratory systems: Secondary | ICD-10-CM

## 2021-01-03 DIAGNOSIS — R000 Tachycardia, unspecified: Secondary | ICD-10-CM

## 2021-01-03 DIAGNOSIS — Z23 Encounter for immunization: Secondary | ICD-10-CM | POA: Insufficient documentation

## 2021-01-03 DIAGNOSIS — I10 Essential (primary) hypertension: Secondary | ICD-10-CM | POA: Diagnosis not present

## 2021-01-03 DIAGNOSIS — Z1159 Encounter for screening for other viral diseases: Secondary | ICD-10-CM | POA: Insufficient documentation

## 2021-01-03 DIAGNOSIS — E119 Type 2 diabetes mellitus without complications: Secondary | ICD-10-CM | POA: Diagnosis not present

## 2021-01-03 DIAGNOSIS — Z720 Tobacco use: Secondary | ICD-10-CM | POA: Insufficient documentation

## 2021-01-03 DIAGNOSIS — E1169 Type 2 diabetes mellitus with other specified complication: Secondary | ICD-10-CM | POA: Insufficient documentation

## 2021-01-03 DIAGNOSIS — N400 Enlarged prostate without lower urinary tract symptoms: Secondary | ICD-10-CM

## 2021-01-03 LAB — BASIC METABOLIC PANEL
BUN: 13 mg/dL (ref 6–23)
CO2: 29 mEq/L (ref 19–32)
Calcium: 10.1 mg/dL (ref 8.4–10.5)
Chloride: 99 mEq/L (ref 96–112)
Creatinine, Ser: 0.82 mg/dL (ref 0.40–1.50)
GFR: 90.07 mL/min (ref >60.00)
Glucose, Bld: 92 mg/dL (ref 70–99)
Potassium: 4 mEq/L (ref 3.5–5.1)
Sodium: 136 mEq/L (ref 135–145)

## 2021-01-03 LAB — HEPATIC FUNCTION PANEL
ALT: 17 U/L (ref 0–53)
AST: 19 U/L (ref 0–37)
Albumin: 4.4 g/dL (ref 3.5–5.2)
Alkaline Phosphatase: 94 U/L (ref 39–117)
Bilirubin, Direct: 0.1 mg/dL (ref 0.0–0.3)
Total Bilirubin: 0.5 mg/dL (ref 0.2–1.2)
Total Protein: 7.8 g/dL (ref 6.0–8.3)

## 2021-01-03 LAB — URINALYSIS, ROUTINE W REFLEX MICROSCOPIC
Bilirubin Urine: NEGATIVE
Ketones, ur: NEGATIVE
Leukocytes,Ua: NEGATIVE
Nitrite: NEGATIVE
Specific Gravity, Urine: 1.015 (ref 1.000–1.030)
Total Protein, Urine: NEGATIVE
Urine Glucose: NEGATIVE
Urobilinogen, UA: 0.2 (ref 0.0–1.0)
pH: 5.5 (ref 5.0–8.0)

## 2021-01-03 LAB — LIPID PANEL
Cholesterol: 169 mg/dL (ref 0–200)
HDL: 45.3 mg/dL (ref >39.00)
LDL Cholesterol: 106 mg/dL — ABNORMAL HIGH (ref 0–99)
NonHDL: 123.84
Total CHOL/HDL Ratio: 4
Triglycerides: 91 mg/dL (ref 0.0–149.0)
VLDL: 18.2 mg/dL (ref 0.0–40.0)

## 2021-01-03 LAB — TSH: TSH: 2.32 u[IU]/mL (ref 0.35–5.50)

## 2021-01-03 LAB — HEMOGLOBIN A1C: Hgb A1c MFr Bld: 6.6 % — ABNORMAL HIGH (ref 4.6–6.5)

## 2021-01-03 LAB — MICROALBUMIN / CREATININE URINE RATIO
Creatinine,U: 51.2 mg/dL
Microalb Creat Ratio: 6.5 mg/g (ref 0.0–30.0)
Microalb, Ur: 3.3 mg/dL — ABNORMAL HIGH (ref 0.0–1.9)

## 2021-01-03 LAB — PSA: PSA: 0.38 ng/mL (ref 0.10–4.00)

## 2021-01-03 MED ORDER — BOOSTRIX 5-2.5-18.5 LF-MCG/0.5 IM SUSP: 0.5000 mL | Freq: Once | INTRAMUSCULAR | 0 refills | Status: AC

## 2021-01-03 MED ORDER — OLMESARTAN MEDOXOMIL 20 MG PO TABS: 20.0000 mg | ORAL_TABLET | Freq: Every day | ORAL | 0 refills | Status: DC

## 2021-01-03 MED ORDER — ROSUVASTATIN CALCIUM 20 MG PO TABS: 20.0000 mg | ORAL_TABLET | Freq: Every day | ORAL | 1 refills | Status: DC

## 2021-01-03 MED ORDER — NEBIVOLOL HCL 5 MG PO TABS: 5.0000 mg | ORAL_TABLET | Freq: Every day | ORAL | 0 refills | Status: DC

## 2021-01-03 MED ORDER — SHINGRIX 50 MCG/0.5ML IM SUSR: 0.5000 mL | Freq: Once | INTRAMUSCULAR | 1 refills | Status: AC

## 2021-01-03 MED ORDER — INDAPAMIDE 1.25 MG PO TABS: 1.2500 mg | ORAL_TABLET | Freq: Every day | ORAL | 0 refills | Status: DC

## 2021-01-03 NOTE — Progress Notes (Signed)
Subjective:  Patient ID: Allen Whitehead, male    DOB: 1950-02-03  Age: 70 y.o. MRN: [id number]  CC: Annual Exam, Hypertension, Diabetes, and Hyperlipidemia   This visit occurred during the SARS-CoV-2 public health emergency.  Safety protocols were in place, including screening questions prior to the visit, additional usage of staff PPE, and extensive cleaning of exam room while observing appropriate contact time as indicated for disinfecting solutions.    HPI ANTHONEE GELIN presents for a CPX and to establish.  He exercises and does not experience CP, DOE, diaphoresis, edema, or fatigue.  He has a history of DM2 but decided to stop taking metformin.  He denies headache, blurred vision, or polys.  Outpatient Medications Prior to Visit  Medication Sig Dispense Refill   celecoxib (CELEBREX) 200 MG capsule Take 1 capsule by mouth 2 (two) times daily.     hydroxychloroquine (PLAQUENIL) 200 MG tablet Take 200 mg by mouth 2 (two) times daily.     meclizine (ANTIVERT) 25 MG tablet Take 1 tablet (25 mg total) by mouth 3 (three) times daily as needed for dizziness. 60 tablet 2   metFORMIN (GLUCOPHAGE) 500 MG tablet Take 1 tablet (500 mg total) by mouth daily with breakfast. 90 tablet 1   No facility-administered medications prior to visit.    ROS Review of Systems  Constitutional:  Negative for chills, diaphoresis, fatigue and fever.  HENT: Negative.    Eyes:  Negative for visual disturbance.  Respiratory:  Negative for cough, chest tightness, shortness of breath and wheezing.   Cardiovascular:  Negative for chest pain, palpitations and leg swelling.  Gastrointestinal:  Negative for abdominal pain, constipation, diarrhea, nausea and vomiting.  Endocrine: Negative.   Genitourinary: Negative.  Negative for difficulty urinating, dysuria, hematuria, scrotal swelling and testicular pain.  Musculoskeletal:  Positive for arthralgias. Negative for myalgias.  Skin: Negative.  Negative for color  change.  Allergic/Immunologic: Negative.   Neurological: Negative.  Negative for dizziness, weakness, light-headedness and headaches.  Hematological:  Negative for adenopathy. Does not bruise/bleed easily.  Psychiatric/Behavioral:  Negative for decreased concentration, dysphoric mood and sleep disturbance. The patient is not nervous/anxious.    Objective:  BP (!) 176/110 (BP Location: Left Arm, Patient Position: Sitting, Cuff Size: Large) Comment: BP (R) 170/104 (L) 176/110   Pulse 96    Temp 98.4 F (36.9 C) (Oral)    Resp 16    Ht 5\' 11"  (1.803 m)    Wt 213 lb (96.6 kg)    SpO2 98%    BMI 29.71 kg/m   BP Readings from Last 3 Encounters:  01/03/21 (!) 176/110  05/10/20 132/76  02/15/20 (!) 163/94    Wt Readings from Last 3 Encounters:  01/03/21 213 lb (96.6 kg)  05/10/20 218 lb 9.6 oz (99.2 kg)  02/15/20 220 lb (99.8 kg)    Physical Exam Vitals reviewed.  Constitutional:      Appearance: Normal appearance.  HENT:     Nose: Nose normal.     Mouth/Throat:     Mouth: Mucous membranes are moist.  Eyes:     General: No scleral icterus.    Conjunctiva/sclera: Conjunctivae normal.  Cardiovascular:     Rate and Rhythm: Tachycardia present.     Heart sounds: Normal heart sounds, S1 normal and S2 normal. No murmur heard.   No friction rub. No gallop.     Comments: EKG - Sinus tachycardia, 103 bpm Mild LVH in V2-V4 No Q waves Pulmonary:     Effort:  Pulmonary effort is normal.     Breath sounds: No stridor. No wheezing, rhonchi or rales.  Abdominal:     General: Abdomen is protuberant. Bowel sounds are normal. There is abdominal bruit.     Tenderness: There is no abdominal tenderness. There is no guarding.     Hernia: No hernia is present. There is no hernia in the left inguinal area or right inguinal area.  Genitourinary:    Pubic Area: No rash.      Penis: Normal and circumcised. No swelling.      Testes: Normal.        Right: Mass, tenderness or swelling not present.         Left: Mass, tenderness or swelling not present.     Epididymis:     Right: Normal.     Left: Normal.     Prostate: Enlarged. Not tender and no nodules present.     Rectum: Normal. Guaiac result negative. No mass, tenderness, anal fissure, external hemorrhoid or internal hemorrhoid. Normal anal tone.  Musculoskeletal:     Cervical back: Neck supple.     Right lower leg: No edema.     Left lower leg: No edema.  Lymphadenopathy:     Cervical: No cervical adenopathy.     Lower Body: No right inguinal adenopathy. No left inguinal adenopathy.  Skin:    General: Skin is warm and dry.     Coloration: Skin is not pale.  Neurological:     General: No focal deficit present.     Mental Status: He is alert. Mental status is at baseline.  Psychiatric:        Mood and Affect: Mood normal.        Behavior: Behavior normal.    Lab Results  Component Value Date   WBC 7.5 05/10/2020   HGB 15.1 05/10/2020   HCT 44.9 05/10/2020   PLT 234.0 05/10/2020   GLUCOSE 92 01/03/2021   CHOL 169 01/03/2021   TRIG 91.0 01/03/2021   HDL 45.30 01/03/2021   LDLCALC 106 (H) 01/03/2021   ALT 17 01/03/2021   AST 19 01/03/2021   NA 136 01/03/2021   K 4.0 01/03/2021   CL 99 01/03/2021   CREATININE 0.82 01/03/2021   BUN 13 01/03/2021   CO2 29 01/03/2021   TSH 2.32 01/03/2021   PSA 0.38 01/03/2021   HGBA1C 6.6 (H) 01/03/2021   MICROALBUR 3.3 (H) 01/03/2021    No results found.  Assessment & Plan:   Deondrea was seen today for annual exam, hypertension, diabetes and hyperlipidemia.  Diagnoses and all orders for this visit:  Hyperlipidemia with target LDL less than 100- I recommend that he take a statin for cardiovascular risk reduction. -     Lipid panel; Future -     TSH; Future -     Hepatic function panel; Future -     Hepatic function panel -     TSH -     Lipid panel -     rosuvastatin (CRESTOR) 20 MG tablet; Take 1 tablet (20 mg total) by mouth daily.  Diabetes mellitus without  complication ([place])- His blood sugar is adequately well controlled. -     HM Diabetes Foot Exam -     Basic metabolic panel; Future -     Hemoglobin A1c; Future -     Microalbumin / creatinine urine ratio; Future -     Microalbumin / creatinine urine ratio -     Hemoglobin A1c -  Basic metabolic panel  Need for shingles vaccine -     Zoster Vaccine Adjuvanted [facility]) injection; Inject 0.5 mLs into the muscle once for 1 dose.  Need for prophylactic vaccination with combined diphtheria-tetanus-pertussis (DTP) vaccine -     Tdap (BOOSTRIX) 5-2.5-18.5 LF-MCG/0.5 injection; Inject 0.5 mLs into the muscle once for 1 dose.  Need for hepatitis C screening test -     Hepatitis C antibody; Future -     Hepatitis C antibody  Continuous tobacco abuse -     Ambulatory Referral for Lung Cancer Scre  Primary hypertension- He has malignant hypertension.  His labs are concerning for pheochromocytoma.  I referred him to endocrinology.  I recommend that he control his blood pressure with labetalol, olmesartan, and indapamide. -     EKG 12-Lead -     Basic metabolic panel; Future -     Aldosterone + renin activity w/ ratio; Future -     TSH; Future -     Urinalysis, Routine w reflex microscopic; Future -     Urine drugs of abuse scrn w alc, routine (Ref Lab); Future -     Hepatic function panel; Future -     Hepatic function panel -     Urine drugs of abuse scrn w alc, routine (Ref Lab) -     Urinalysis, Routine w reflex microscopic -     TSH -     Aldosterone + renin activity w/ ratio -     Basic metabolic panel -     nebivolol (BYSTOLIC) 5 MG tablet; Take 1 tablet (5 mg total) by mouth daily. -     olmesartan (BENICAR) 20 MG tablet; Take 1 tablet (20 mg total) by mouth daily. -     Discontinue: indapamide (LOZOL) 1.25 MG tablet; Take 1 tablet (1.25 mg total) by mouth daily.  Abdominal bruit -     VAS [place] AAA DUPLEX; Future  Benign prostatic hyperplasia without lower urinary tract  symptoms- His PSA is reassuring. -     PSA; Future -     PSA  Tachycardia -     TSH; Future -     Metanephrines, plasma; Future -     Metanephrines, plasma -     TSH -     nebivolol (BYSTOLIC) 5 MG tablet; Take 1 tablet (5 mg total) by mouth daily.  LVH (left ventricular hypertrophy) due to hypertensive disease, without heart failure -     nebivolol (BYSTOLIC) 5 MG tablet; Take 1 tablet (5 mg total) by mouth daily. -     olmesartan (BENICAR) 20 MG tablet; Take 1 tablet (20 mg total) by mouth daily. -     Discontinue: indapamide (LOZOL) 1.25 MG tablet; Take 1 tablet (1.25 mg total) by mouth daily.  Elevated plasma metanephrines -     Ambulatory referral to Endocrinology  Malignant hypertension- See above.   I have discontinued Gwyndolyn [facility]on A[facility] Manning's metFORMIN and nebivolol. I am also having him start on Boostrix, Shingrix, rosuvastatin, olmesartan, and labetalol. Additionally, I am having him maintain his hydroxychloroquine, celecoxib, and meclizine.  Meds ordered this encounter  Medications   Tdap (BOOSTRIX) 5-2.5-18.5 LF-MCG/0.5 injection    Sig: Inject 0.5 mLs into the muscle once for 1 dose.    Dispense:  0.5 mL    Refill:  0   Zoster Vaccine Adjuvanted [facility]) injection    Sig: Inject 0.5 mLs into the muscle once for 1 dose.    Dispense:  0.5 mL  Refill:  1   rosuvastatin (CRESTOR) 20 MG tablet    Sig: Take 1 tablet (20 mg total) by mouth daily.    Dispense:  90 tablet    Refill:  1   DISCONTD: nebivolol (BYSTOLIC) 5 MG tablet    Sig: Take 1 tablet (5 mg total) by mouth daily.    Dispense:  90 tablet    Refill:  0   olmesartan (BENICAR) 20 MG tablet    Sig: Take 1 tablet (20 mg total) by mouth daily.    Dispense:  90 tablet    Refill:  0   DISCONTD: indapamide (LOZOL) 1.25 MG tablet    Sig: Take 1 tablet (1.25 mg total) by mouth daily.    Dispense:  90 tablet    Refill:  0   labetalol (NORMODYNE) 100 MG tablet    Sig: Take 1 tablet (100 mg total) by mouth 2  (two) times daily.    Dispense:  180 tablet    Refill:  0     Follow-up: Return in about 4 weeks (around 01/31/2021).  Scarlette Calico, MD

## 2021-01-03 NOTE — Patient Instructions (Signed)

## 2021-01-04 LAB — URINE DRUGS OF ABUSE SCREEN W ALC, ROUTINE (REF LAB)
Amphetamines, Urine: NEGATIVE ng/mL
Barbiturate Quant, Ur: NEGATIVE ng/mL
Benzodiazepine Quant, Ur: NEGATIVE ng/mL
Cannabinoid Quant, Ur: NEGATIVE ng/mL
Cocaine (Metab.): NEGATIVE ng/mL
Ethanol, Urine: NEGATIVE %
Methadone Screen, Urine: NEGATIVE ng/mL
Opiate Quant, Ur: NEGATIVE ng/mL
PCP Quant, Ur: NEGATIVE ng/mL
Propoxyphene: NEGATIVE ng/mL

## 2021-01-10 ENCOUNTER — Telehealth: Payer: Self-pay | Admitting: Internal Medicine

## 2021-01-10 DIAGNOSIS — Z79899 Other long term (current) drug therapy: Secondary | ICD-10-CM | POA: Diagnosis not present

## 2021-01-10 DIAGNOSIS — M0609 Rheumatoid arthritis without rheumatoid factor, multiple sites: Secondary | ICD-10-CM | POA: Diagnosis not present

## 2021-01-10 DIAGNOSIS — M150 Primary generalized (osteo)arthritis: Secondary | ICD-10-CM | POA: Diagnosis not present

## 2021-01-10 DIAGNOSIS — I10 Essential (primary) hypertension: Secondary | ICD-10-CM | POA: Diagnosis not present

## 2021-01-10 DIAGNOSIS — E663 Overweight: Secondary | ICD-10-CM | POA: Diagnosis not present

## 2021-01-10 DIAGNOSIS — M2550 Pain in unspecified joint: Secondary | ICD-10-CM | POA: Diagnosis not present

## 2021-01-10 DIAGNOSIS — Z6829 Body mass index (BMI) 29.0-29.9, adult: Secondary | ICD-10-CM | POA: Diagnosis not present

## 2021-01-10 NOTE — Telephone Encounter (Signed)
Cindy from [place]  called and states pt was in office today and BP reading was 200/110. Wanted to make PCP aware, advised pt to follow- up with PCP.   Callback #-  [phone]

## 2021-01-11 ENCOUNTER — Other Ambulatory Visit: Payer: Self-pay | Admitting: Internal Medicine

## 2021-01-11 DIAGNOSIS — I10 Essential (primary) hypertension: Secondary | ICD-10-CM | POA: Insufficient documentation

## 2021-01-11 MED ORDER — TRIAMTERENE-HCTZ 37.5-25 MG PO CAPS: 1.0000 | ORAL_CAPSULE | Freq: Every day | ORAL | 0 refills | Status: DC

## 2021-01-11 NOTE — Telephone Encounter (Signed)
Called pt, LVM to discuss message from [place] and PCP recommendations.

## 2021-01-16 ENCOUNTER — Encounter: Payer: Self-pay | Admitting: Internal Medicine

## 2021-01-16 NOTE — Telephone Encounter (Signed)
Patient returning nurse's call  Informed patient of provider's recommendations  Patient states he is not taking indapamide  Patient understood recommendations and have no questions at this time  Please call patient when MRA is ordered

## 2021-01-19 ENCOUNTER — Encounter: Payer: Self-pay | Admitting: Internal Medicine

## 2021-01-19 DIAGNOSIS — R7989 Other specified abnormal findings of blood chemistry: Secondary | ICD-10-CM | POA: Insufficient documentation

## 2021-01-19 DIAGNOSIS — Z0001 Encounter for general adult medical examination with abnormal findings: Secondary | ICD-10-CM | POA: Insufficient documentation

## 2021-01-19 LAB — METANEPHRINES, PLASMA
Metanephrine, Free: 29 pg/mL (ref <=57)
Normetanephrine, Free: 240 pg/mL — ABNORMAL HIGH (ref <=148)
Total Metanephrines-Plasma: 269 pg/mL — ABNORMAL HIGH (ref <=205)

## 2021-01-19 LAB — HEPATITIS C ANTIBODY
Hepatitis C Ab: NONREACTIVE
SIGNAL TO CUT-OFF: 0.03 (ref <1.00)

## 2021-01-19 LAB — ALDOSTERONE + RENIN ACTIVITY W/ RATIO
ALDO / PRA Ratio: 3.2 Ratio (ref 0.9–28.9)
Aldosterone: 4 ng/dL
Renin Activity: 1.24 ng/mL/h (ref 0.25–5.82)

## 2021-01-19 MED ORDER — LABETALOL HCL 100 MG PO TABS: 100.0000 mg | ORAL_TABLET | Freq: Two times a day (BID) | ORAL | 0 refills | Status: DC

## 2021-01-19 NOTE — Assessment & Plan Note (Signed)
Exam completed Labs reviewed Vaccines reviewed and updated Cancer screenings addressed Patient education was given. 

## 2021-01-23 ENCOUNTER — Telehealth: Payer: Self-pay

## 2021-01-23 NOTE — Telephone Encounter (Signed)
-----   Message from Thomes Cake, [place] sent at 01/23/2021  9:35 AM EST ----- Regarding: FW: Change BP med  ----- Message ----- From: [name], MD Sent: 01/19/2021  11:31 AM EST To: Thomes Cake, CMA Subject: Change BP med                                  Please let know that one of his labs was abnormal. He will get a letter with the details and info about a referral Ask him to STOP nebivolol and START labetalol for better BP control  TJ

## 2021-01-23 NOTE — Telephone Encounter (Signed)
Pt has been informed of Rx change and expressed understanding.   I have informed him of lab results as outlined in 01/19/21 letter below:  "You are diabetic but your blood sugar is well controlled. Please take the statin as directed to reduce your risk of heart attack/stroke. The test for metanephrines was elevated - you may have a tumor that produces too much of this. I have ordered a referral for you to see a specialist about this. The other labs are all okay."  He expressed understanding.

## 2021-02-07 ENCOUNTER — Encounter: Payer: Self-pay | Admitting: Internal Medicine

## 2021-02-07 ENCOUNTER — Other Ambulatory Visit: Payer: Self-pay

## 2021-02-07 ENCOUNTER — Ambulatory Visit (INDEPENDENT_AMBULATORY_CARE_PROVIDER_SITE_OTHER): Payer: Medicare HMO | Admitting: Internal Medicine

## 2021-02-07 VITALS — BP 150/80 | HR 74 | Temp 97.9°F | Ht 71.0 in | Wt 212.4 lb

## 2021-02-07 DIAGNOSIS — R7989 Other specified abnormal findings of blood chemistry: Secondary | ICD-10-CM | POA: Diagnosis not present

## 2021-02-07 DIAGNOSIS — I10 Essential (primary) hypertension: Secondary | ICD-10-CM

## 2021-02-07 DIAGNOSIS — R0989 Other specified symptoms and signs involving the circulatory and respiratory systems: Secondary | ICD-10-CM | POA: Diagnosis not present

## 2021-02-07 LAB — CBC WITH DIFFERENTIAL/PLATELET
Basophils Absolute: 0 10*3/uL (ref 0.0–0.1)
Basophils Relative: 0.4 % (ref 0.0–3.0)
Eosinophils Absolute: 0.1 10*3/uL (ref 0.0–0.7)
Eosinophils Relative: 1.4 % (ref 0.0–5.0)
HCT: 43.1 % (ref 39.0–52.0)
Hemoglobin: 14.4 g/dL (ref 13.0–17.0)
Lymphocytes Relative: 26.4 % (ref 12.0–46.0)
Lymphs Abs: 1.9 10*3/uL (ref 0.7–4.0)
MCHC: 33.4 g/dL (ref 30.0–36.0)
MCV: 87.2 fl (ref 78.0–100.0)
Monocytes Absolute: 0.6 10*3/uL (ref 0.1–1.0)
Monocytes Relative: 8.1 % (ref 3.0–12.0)
Neutro Abs: 4.7 10*3/uL (ref 1.4–7.7)
Neutrophils Relative %: 63.7 % (ref 43.0–77.0)
Platelets: 225 10*3/uL (ref 150.0–400.0)
RBC: 4.95 Mil/uL (ref 4.22–5.81)
RDW: 14.2 % (ref 11.5–15.5)
WBC: 7.3 10*3/uL (ref 4.0–10.5)

## 2021-02-07 LAB — BASIC METABOLIC PANEL
BUN: 11 mg/dL (ref 6–23)
CO2: 27 mEq/L (ref 19–32)
Calcium: 9.7 mg/dL (ref 8.4–10.5)
Chloride: 103 mEq/L (ref 96–112)
Creatinine, Ser: 0.88 mg/dL (ref 0.40–1.50)
GFR: 88.11 mL/min (ref >60.00)
Glucose, Bld: 96 mg/dL (ref 70–99)
Potassium: 4.1 mEq/L (ref 3.5–5.1)
Sodium: 139 mEq/L (ref 135–145)

## 2021-02-07 NOTE — Progress Notes (Signed)
Subjective:  Patient ID: Allen Whitehead, male    DOB: April 20, 1950  Age: 71 y.o. MRN: [id number]  CC: Hypertension  This visit occurred during the SARS-CoV-2 public health emergency.  Safety protocols were in place, including screening questions prior to the visit, additional usage of staff PPE, and extensive cleaning of exam room while observing appropriate contact time as indicated for disinfecting solutions.    HPI Allen Whitehead presents for f/up -  He is tolerating the antihypertensives well.  His insurance company did not approve the MRI/MRA of the abdomen.  He denies headache, blurred vision, dizziness, lightheadedness, chest pain, or shortness of breath.  He continues to refuse any vaccinations.  Outpatient Medications Prior to Visit  Medication Sig Dispense Refill   celecoxib (CELEBREX) 200 MG capsule Take 1 capsule by mouth 2 (two) times daily.     hydroxychloroquine (PLAQUENIL) 200 MG tablet Take 200 mg by mouth 2 (two) times daily.     labetalol (NORMODYNE) 100 MG tablet Take 1 tablet (100 mg total) by mouth 2 (two) times daily. 180 tablet 0   meclizine (ANTIVERT) 25 MG tablet Take 1 tablet (25 mg total) by mouth 3 (three) times daily as needed for dizziness. 60 tablet 2   olmesartan (BENICAR) 20 MG tablet Take 1 tablet (20 mg total) by mouth daily. 90 tablet 0   rosuvastatin (CRESTOR) 20 MG tablet Take 1 tablet (20 mg total) by mouth daily. 90 tablet 1   triamterene-hydrochlorothiazide (DYAZIDE) 37.5-25 MG capsule Take 1 each (1 capsule total) by mouth daily. 90 capsule 0   No facility-administered medications prior to visit.    ROS Review of Systems  Constitutional:  Negative for diaphoresis, fatigue and unexpected weight change.  HENT: Negative.    Eyes: Negative.   Respiratory:  Negative for apnea, cough, shortness of breath and wheezing.   Cardiovascular:  Negative for chest pain, palpitations and leg swelling.  Gastrointestinal:  Negative for abdominal pain,  constipation, diarrhea and nausea.  Endocrine: Negative.   Genitourinary: Negative.  Negative for difficulty urinating, dysuria and hematuria.  Musculoskeletal: Negative.  Negative for arthralgias and myalgias.  Skin: Negative.   Neurological:  Positive for dizziness. Negative for weakness, light-headedness and headaches.  Hematological:  Negative for adenopathy. Does not bruise/bleed easily.  Psychiatric/Behavioral:  Positive for confusion and decreased concentration. Negative for sleep disturbance. The patient is not nervous/anxious.    Objective:  BP (!) 150/80    Pulse 74    Temp 97.9 F (36.6 C) (Oral)    Ht 5\' 11"  (1.803 m)    Wt 212 lb 6.4 oz (96.3 kg)    SpO2 98%    BMI 29.62 kg/m   BP Readings from Last 3 Encounters:  02/07/21 (!) 150/80  01/03/21 (!) 176/110  05/10/20 132/76    Wt Readings from Last 3 Encounters:  02/07/21 212 lb 6.4 oz (96.3 kg)  01/03/21 213 lb (96.6 kg)  05/10/20 218 lb 9.6 oz (99.2 kg)    Physical Exam Vitals reviewed.  Constitutional:      Appearance: He is not ill-appearing.  HENT:     Nose: Nose normal.     Mouth/Throat:     Mouth: Mucous membranes are moist.  Eyes:     General: No scleral icterus.    Conjunctiva/sclera: Conjunctivae normal.  Cardiovascular:     Rate and Rhythm: Normal rate and regular rhythm.     Pulses: Normal pulses.     Heart sounds: No murmur heard.  No gallop.  Pulmonary:     Effort: Pulmonary effort is normal.     Breath sounds: No stridor. No wheezing, rhonchi or rales.  Abdominal:     General: Abdomen is flat. There is abdominal bruit.     Palpations: There is no mass.     Tenderness: There is no abdominal tenderness. There is no guarding.     Hernia: No hernia is present.  Musculoskeletal:     Cervical back: Neck supple.  Lymphadenopathy:     Cervical: No cervical adenopathy.  Skin:    General: Skin is warm and dry.  Neurological:     General: No focal deficit present.     Mental Status: He is  alert.  Psychiatric:        Mood and Affect: Mood normal.        Behavior: Behavior normal.    Lab Results  Component Value Date   WBC 7.3 02/07/2021   HGB 14.4 02/07/2021   HCT 43.1 02/07/2021   PLT 225.0 02/07/2021   GLUCOSE 96 02/07/2021   CHOL 169 01/03/2021   TRIG 91.0 01/03/2021   HDL 45.30 01/03/2021   LDLCALC 106 (H) 01/03/2021   ALT 17 01/03/2021   AST 19 01/03/2021   NA 139 02/07/2021   K 4.1 02/07/2021   CL 103 02/07/2021   CREATININE 0.88 02/07/2021   BUN 11 02/07/2021   CO2 27 02/07/2021   TSH 2.32 01/03/2021   PSA 0.38 01/03/2021   HGBA1C 6.6 (H) 01/03/2021   MICROALBUR 3.3 (H) 01/03/2021    No results found.  Assessment & Plan:   Allen Whitehead was seen today for hypertension.  Diagnoses and all orders for this visit:  Elevated plasma metanephrines  Malignant hypertension- Will screen for renal artery stenosis with an ultrasound.  His blood pressure has improved but he has not achieved his goal of 130/80.  For now, will continue the current antihypertensives and I have asked him to improve his lifestyle modifications.  His labs are normal. -     Basic metabolic panel; Future -     CBC with Differential/Platelet; Future -     Cancel: US Abdomen Complete; Future -     US Renal; Future -     CBC with Differential/Platelet -     Basic metabolic panel  Abdominal bruit -     Cancel: US Abdomen Complete; Future -     US AORTA DUPLEX COMPLETE; Future   I am having Allen Whitehead maintain his hydroxychloroquine, celecoxib, meclizine, rosuvastatin, olmesartan, triamterene-hydrochlorothiazide, and labetalol.  No orders of the defined types were placed in this encounter.    Follow-up: Return in about 3 months (around 05/08/2021).  Scarlette Calico, MD

## 2021-02-07 NOTE — Patient Instructions (Signed)

## 2021-02-15 ENCOUNTER — Ambulatory Visit
Admission: RE | Admit: 2021-02-15 | Discharge: 2021-02-15 | Disposition: A | Discharge status: Home / Self Care | Payer: Medicare HMO | Source: Ambulatory Visit | Attending: Internal Medicine | Admitting: Internal Medicine

## 2021-02-15 DIAGNOSIS — R0989 Other specified symptoms and signs involving the circulatory and respiratory systems: Secondary | ICD-10-CM

## 2021-02-15 DIAGNOSIS — Z8679 Personal history of other diseases of the circulatory system: Secondary | ICD-10-CM | POA: Diagnosis not present

## 2021-02-15 DIAGNOSIS — I10 Essential (primary) hypertension: Secondary | ICD-10-CM

## 2021-02-16 ENCOUNTER — Telehealth: Payer: Self-pay

## 2021-02-16 NOTE — Telephone Encounter (Signed)
Pt returned call to get [place] results. Advised pt that it was normal. No issues found.   Pt expressed understanding and had no further questions.  FYI

## 2021-03-08 ENCOUNTER — Other Ambulatory Visit: Payer: Self-pay | Admitting: *Deleted

## 2021-03-08 DIAGNOSIS — Z87891 Personal history of nicotine dependence: Secondary | ICD-10-CM

## 2021-03-08 DIAGNOSIS — F17210 Nicotine dependence, cigarettes, uncomplicated: Secondary | ICD-10-CM

## 2021-03-20 ENCOUNTER — Other Ambulatory Visit: Payer: Self-pay

## 2021-03-20 ENCOUNTER — Encounter: Payer: Self-pay | Admitting: Acute Care

## 2021-03-20 ENCOUNTER — Ambulatory Visit (INDEPENDENT_AMBULATORY_CARE_PROVIDER_SITE_OTHER): Payer: Medicare HMO | Admitting: Acute Care

## 2021-03-20 DIAGNOSIS — F17210 Nicotine dependence, cigarettes, uncomplicated: Secondary | ICD-10-CM | POA: Diagnosis not present

## 2021-03-20 DIAGNOSIS — R69 Illness, unspecified: Secondary | ICD-10-CM | POA: Diagnosis not present

## 2021-03-20 NOTE — Patient Instructions (Signed)
Thank you for participating in the [place] [facility]. °It was our pleasure to meet you today. °We will call you with the results of your scan within the next few days. °Your scan will be assigned a Lung RADS category score by the physicians reading the scans.  °This Lung RADS score determines follow up scanning.  °See below for description of categories, and follow up screening recommendations. °We will be in touch to schedule your follow up screening annually or based on recommendations of our providers. °We will fax a copy of your scan results to your Primary Care Physician, or the physician who referred you to the program, to ensure they have the results. °Please call the office if you have any questions or concerns regarding your scanning experience or results.  °Our office number is [phone]. °Please speak with Denise Phelps, RN. She is our Lung Cancer Screening RN. °If she is unavailable when you call, please have the office staff send her a message. She will return your call at her earliest convenience. °Remember, if your scan is normal, we will scan you annually as long as you continue to meet the criteria for the program. (Age 55-77, Current smoker or smoker who has quit within the last 15 years). °If you are a smoker, remember, quitting is the single most powerful action that you can take to decrease your risk of lung cancer and other pulmonary, breathing related problems. °We know quitting is hard, and we are here to help.  °Please let us know if there is anything we can do to help you meet your goal of quitting. °If you are a former smoker, congratulations. We are proud of you! Remain smoke free! °Remember you can refer friends or family members through the number above.  °We will screen them to make sure they meet criteria for the program. °Thank you for helping us take better care of you by participating in Lung Screening. ° °You can receive free nicotine replacement therapy  ( patches, gum or mints) by calling 1-800-QUIT NOW. Please call so we can get you on the path to becoming  a non-smoker. I know it is hard, but you can do this! ° °Lung RADS Categories: ° °Lung RADS 1: no nodules or definitely non-concerning nodules.  °Recommendation is for a repeat annual scan in 12 months. ° °Lung RADS 2:  nodules that are non-concerning in appearance and behavior with a very low likelihood of becoming an active cancer. °Recommendation is for a repeat annual scan in 12 months. ° °Lung RADS 3: nodules that are probably non-concerning , includes nodules with a low likelihood of becoming an active cancer.  Recommendation is for a 6-month repeat screening scan. Often noted after an upper respiratory illness. We will be in touch to make sure you have no questions, and to schedule your 6-month scan. ° °Lung RADS 4 A: nodules with concerning findings, recommendation is most often for a follow up scan in 3 months or additional testing based on our provider's assessment of the scan. We will be in touch to make sure you have no questions and to schedule the recommended 3 month follow up scan. ° °Lung RADS 4 B:  indicates findings that are concerning. We will be in touch with you to schedule additional diagnostic testing based on our provider's  assessment of the scan. ° °Hypnosis for smoking cessation  °[employer] °[phone] ° °Acupuncture for smoking cessation  °[facility] °[phone]  °

## 2021-03-20 NOTE — Progress Notes (Signed)
Virtual Visit via Telephone Note  I connected with Allen Whitehead on 03/20/21 at  2:00 PM EST by telephone and verified that I am speaking with the correct person using two identifiers.  Location: Patient: at home Provider: [place], [place], [place], [address]    I discussed the limitations, risks, security and privacy concerns of performing an evaluation and management service by telephone and the availability of in person appointments. I also discussed with the patient that there may be a patient responsible charge related to this service. The patient expressed understanding and agreed to proceed.     Shared Decision Making Visit Lung Cancer Screening Program [phone])   Eligibility: Age 71 y.o. Pack Years Smoking History Calculation 50 pack years (# packs/per year x # years smoked) Recent History of coughing up blood  no Unexplained weight loss? no ( >Than 15 pounds within the last 6 months ) Prior History Lung / other cancer no (Diagnosis within the last 5 years already requiring surveillance chest CT Scans). Smoking Status Current Smoker Former Smokers: Years since quit:  NA  Quit Date: NA  Visit Components: Discussion included one or more decision making aids. yes Discussion included risk/benefits of screening. yes Discussion included potential follow up diagnostic testing for abnormal scans. yes Discussion included meaning and risk of over diagnosis. yes Discussion included meaning and risk of False Positives. yes Discussion included meaning of total radiation exposure. yes  Counseling Included: Importance of adherence to annual lung cancer LDCT screening. yes Impact of comorbidities on ability to participate in the program. yes Ability and willingness to under diagnostic treatment. yes  Smoking Cessation Counseling: Current Smokers:  Discussed importance of smoking cessation. yes Information about tobacco cessation classes and interventions provided to  patient. yes Patient provided with "ticket" for LDCT Scan. yes Symptomatic Patient. no  Counseling NA Diagnosis Code: Tobacco Use Z72.0 Asymptomatic Patient yes  Counseling (Intermediate counseling: > three minutes counseling) [id number] Former Smokers:  Discussed the importance of maintaining cigarette abstinence. yes Diagnosis Code: Personal History of Nicotine Dependence. K74.259 Information about tobacco cessation classes and interventions provided to patient. Yes Patient provided with "ticket" for LDCT Scan. yes Written Order for Lung Cancer Screening with LDCT placed in Epic. Yes (CT Chest Lung Cancer Screening Low Dose W/O CM) [id number] Z12.2-Screening of respiratory organs Z87.891-Personal history of nicotine dependence  I have spent 25 minutes of face to face/ virtual visit   time with  Allen Whitehead discussing the risks and benefits of lung cancer screening. We viewed / discussed a power point together that explained in detail the above noted topics. We paused at intervals to allow for questions to be asked and answered to ensure understanding.We discussed that the single most powerful action that she can take to decrease her risk of developing lung cancer is to quit smoking. We discussed whether or not she is ready to commit to setting a quit date. We discussed options for tools to aid in quitting smoking including nicotine replacement therapy, non-nicotine medications, support groups, Quit Smart classes, and behavior modification. We discussed that often times setting smaller, more achievable goals, such as eliminating 1 cigarette a day for a week and then 2 cigarettes a day for a week can be helpful in slowly decreasing the number of cigarettes smoked. This allows for a sense of accomplishment as well as providing a clinical benefit. I provided  her  with smoking cessation  information  with contact information for community resources, classes, free nicotine  replacement therapy, and access to  mobile apps, text messaging, and on-line smoking cessation help. I have also provided  her  the office contact information in the event she needs to contact me, or the screening staff. We discussed the time and location of the scan, and that either Doroteo Glassman RN, Joella Prince, RN  or I will call / send a letter with the results within 24-72 hours of receiving them. The patient verbalized understanding of all of  the above and had no further questions upon leaving the office. They have my contact information in the event they have any further questions.  I spent 3 minutes counseling on smoking cessation and the health risks of continued tobacco abuse.  I explained to the patient that there has been a high incidence of coronary artery disease noted on these exams. I explained that this is a non-gated exam therefore degree or severity cannot be determined. This patient is on statin therapy. I have asked the patient to follow-up with their PCP regarding any incidental finding of coronary artery disease and management with diet or medication as their PCP  feels is clinically indicated. The patient verbalized understanding of the above and had no further questions upon completion of the visit.      Magdalen Spatz, NP

## 2021-03-22 ENCOUNTER — Other Ambulatory Visit: Payer: Self-pay

## 2021-03-22 ENCOUNTER — Ambulatory Visit (INDEPENDENT_AMBULATORY_CARE_PROVIDER_SITE_OTHER)
Admission: RE | Admit: 2021-03-22 | Discharge: 2021-03-22 | Disposition: A | Discharge status: Home / Self Care | Payer: Medicare HMO | Source: Ambulatory Visit | Attending: Acute Care | Admitting: Acute Care

## 2021-03-22 DIAGNOSIS — R69 Illness, unspecified: Secondary | ICD-10-CM | POA: Diagnosis not present

## 2021-03-22 DIAGNOSIS — F17210 Nicotine dependence, cigarettes, uncomplicated: Secondary | ICD-10-CM

## 2021-03-22 DIAGNOSIS — Z87891 Personal history of nicotine dependence: Secondary | ICD-10-CM

## 2021-03-26 ENCOUNTER — Other Ambulatory Visit: Payer: Self-pay

## 2021-03-26 DIAGNOSIS — F17210 Nicotine dependence, cigarettes, uncomplicated: Secondary | ICD-10-CM

## 2021-03-26 DIAGNOSIS — Z87891 Personal history of nicotine dependence: Secondary | ICD-10-CM

## 2021-03-30 ENCOUNTER — Telehealth: Payer: Self-pay | Admitting: Acute Care

## 2021-03-30 NOTE — Telephone Encounter (Signed)
Patient left VM on [facility] phone requesting to review results of LDCT.  Letter was mailed this week but patient stated on VM he had not received any information on results.  Patient requested we call his cell number and leave a detailed VM with results.  Called the number which was identified as "Agricultural engineer.  Left detailed VM that CT scan was read as benign findings with no concerns at this time for lung cancer.  Also noted were coronary artery calcifications and emphysema.  Patient is on a statin medication and is a current smoker.  Results were routed to Dr. Ronnald Ramp for further review and discussion, as needed. Patient to call back if further questions or concerns.   ?

## 2021-04-13 ENCOUNTER — Other Ambulatory Visit: Payer: Self-pay | Admitting: Internal Medicine

## 2021-04-13 DIAGNOSIS — I10 Essential (primary) hypertension: Secondary | ICD-10-CM

## 2021-04-21 ENCOUNTER — Other Ambulatory Visit: Payer: Self-pay | Admitting: Internal Medicine

## 2021-04-21 DIAGNOSIS — R000 Tachycardia, unspecified: Secondary | ICD-10-CM

## 2021-04-21 DIAGNOSIS — I119 Hypertensive heart disease without heart failure: Secondary | ICD-10-CM

## 2021-04-21 DIAGNOSIS — I10 Essential (primary) hypertension: Secondary | ICD-10-CM

## 2021-04-22 ENCOUNTER — Inpatient Hospital Stay (HOSPITAL_COMMUNITY)
Admission: EM | Admit: 2021-04-22 | Discharge: 2021-04-24 | DRG: 247 | Disposition: A | Discharge status: Home / Self Care | Payer: Medicare HMO | Attending: Internal Medicine | Admitting: Internal Medicine

## 2021-04-22 ENCOUNTER — Emergency Department (HOSPITAL_COMMUNITY): Payer: Medicare HMO

## 2021-04-22 ENCOUNTER — Encounter (HOSPITAL_COMMUNITY): Payer: Self-pay

## 2021-04-22 ENCOUNTER — Other Ambulatory Visit: Payer: Self-pay

## 2021-04-22 DIAGNOSIS — R7989 Other specified abnormal findings of blood chemistry: Secondary | ICD-10-CM

## 2021-04-22 DIAGNOSIS — I2510 Atherosclerotic heart disease of native coronary artery without angina pectoris: Secondary | ICD-10-CM | POA: Diagnosis not present

## 2021-04-22 DIAGNOSIS — M1990 Unspecified osteoarthritis, unspecified site: Secondary | ICD-10-CM | POA: Diagnosis present

## 2021-04-22 DIAGNOSIS — Z7984 Long term (current) use of oral hypoglycemic drugs: Secondary | ICD-10-CM

## 2021-04-22 DIAGNOSIS — E1169 Type 2 diabetes mellitus with other specified complication: Secondary | ICD-10-CM | POA: Insufficient documentation

## 2021-04-22 DIAGNOSIS — R42 Dizziness and giddiness: Secondary | ICD-10-CM | POA: Diagnosis not present

## 2021-04-22 DIAGNOSIS — F32A Depression, unspecified: Secondary | ICD-10-CM | POA: Diagnosis present

## 2021-04-22 DIAGNOSIS — E785 Hyperlipidemia, unspecified: Secondary | ICD-10-CM | POA: Diagnosis present

## 2021-04-22 DIAGNOSIS — Z743 Need for continuous supervision: Secondary | ICD-10-CM | POA: Diagnosis not present

## 2021-04-22 DIAGNOSIS — Z833 Family history of diabetes mellitus: Secondary | ICD-10-CM

## 2021-04-22 DIAGNOSIS — Z635 Disruption of family by separation and divorce: Secondary | ICD-10-CM | POA: Diagnosis not present

## 2021-04-22 DIAGNOSIS — E119 Type 2 diabetes mellitus without complications: Secondary | ICD-10-CM | POA: Diagnosis not present

## 2021-04-22 DIAGNOSIS — R739 Hyperglycemia, unspecified: Secondary | ICD-10-CM | POA: Diagnosis not present

## 2021-04-22 DIAGNOSIS — R69 Illness, unspecified: Secondary | ICD-10-CM | POA: Diagnosis not present

## 2021-04-22 DIAGNOSIS — Z79899 Other long term (current) drug therapy: Secondary | ICD-10-CM | POA: Diagnosis not present

## 2021-04-22 DIAGNOSIS — I214 Non-ST elevation (NSTEMI) myocardial infarction: Secondary | ICD-10-CM | POA: Diagnosis not present

## 2021-04-22 DIAGNOSIS — F17210 Nicotine dependence, cigarettes, uncomplicated: Secondary | ICD-10-CM | POA: Diagnosis present

## 2021-04-22 DIAGNOSIS — F419 Anxiety disorder, unspecified: Secondary | ICD-10-CM | POA: Diagnosis present

## 2021-04-22 DIAGNOSIS — R778 Other specified abnormalities of plasma proteins: Secondary | ICD-10-CM

## 2021-04-22 DIAGNOSIS — I700 Atherosclerosis of aorta: Secondary | ICD-10-CM | POA: Diagnosis not present

## 2021-04-22 DIAGNOSIS — R110 Nausea: Secondary | ICD-10-CM | POA: Diagnosis present

## 2021-04-22 DIAGNOSIS — Z87442 Personal history of urinary calculi: Secondary | ICD-10-CM | POA: Diagnosis not present

## 2021-04-22 DIAGNOSIS — R079 Chest pain, unspecified: Secondary | ICD-10-CM | POA: Diagnosis not present

## 2021-04-22 DIAGNOSIS — I517 Cardiomegaly: Secondary | ICD-10-CM | POA: Diagnosis present

## 2021-04-22 DIAGNOSIS — I119 Hypertensive heart disease without heart failure: Secondary | ICD-10-CM | POA: Diagnosis not present

## 2021-04-22 DIAGNOSIS — I10 Essential (primary) hypertension: Secondary | ICD-10-CM

## 2021-04-22 DIAGNOSIS — M069 Rheumatoid arthritis, unspecified: Secondary | ICD-10-CM | POA: Diagnosis present

## 2021-04-22 DIAGNOSIS — Z955 Presence of coronary angioplasty implant and graft: Secondary | ICD-10-CM

## 2021-04-22 DIAGNOSIS — Z8249 Family history of ischemic heart disease and other diseases of the circulatory system: Secondary | ICD-10-CM

## 2021-04-22 DIAGNOSIS — Z720 Tobacco use: Secondary | ICD-10-CM | POA: Diagnosis present

## 2021-04-22 DIAGNOSIS — R0789 Other chest pain: Secondary | ICD-10-CM | POA: Diagnosis not present

## 2021-04-22 HISTORY — DX: Other specified abnormal findings of blood chemistry: R79.89

## 2021-04-22 HISTORY — DX: Atherosclerotic heart disease of native coronary artery without angina pectoris: I25.10

## 2021-04-22 HISTORY — DX: Anxiety disorder, unspecified: F41.9

## 2021-04-22 HISTORY — DX: Hyperlipidemia, unspecified: E78.5

## 2021-04-22 LAB — CBC
HCT: 42.5 % (ref 39.0–52.0)
Hemoglobin: 14.2 g/dL (ref 13.0–17.0)
MCH: 30.1 pg (ref 26.0–34.0)
MCHC: 33.4 g/dL (ref 30.0–36.0)
MCV: 90.2 fL (ref 80.0–100.0)
Platelets: 217 10*3/uL (ref 150–400)
RBC: 4.71 MIL/uL (ref 4.22–5.81)
RDW: 14 % (ref 11.5–15.5)
WBC: 6.9 10*3/uL (ref 4.0–10.5)
nRBC: 0 % (ref 0.0–0.2)

## 2021-04-22 LAB — BASIC METABOLIC PANEL
Anion gap: 6 (ref 5–15)
BUN: 9 mg/dL (ref 8–23)
CO2: 25 mmol/L (ref 22–32)
Calcium: 9.1 mg/dL (ref 8.9–10.3)
Chloride: 106 mmol/L (ref 98–111)
Creatinine, Ser: 0.91 mg/dL (ref 0.61–1.24)
GFR, Estimated: >60 mL/min (ref >60)
Glucose, Bld: 227 mg/dL — ABNORMAL HIGH (ref 70–99)
Potassium: 4.5 mmol/L (ref 3.5–5.1)
Sodium: 137 mmol/L (ref 135–145)

## 2021-04-22 LAB — HEMOGLOBIN A1C
Hgb A1c MFr Bld: 6.7 % — ABNORMAL HIGH (ref 4.8–5.6)
Mean Plasma Glucose: 145.59 mg/dL

## 2021-04-22 LAB — HIV ANTIBODY (ROUTINE TESTING W REFLEX): HIV Screen 4th Generation wRfx: NONREACTIVE

## 2021-04-22 LAB — TROPONIN I (HIGH SENSITIVITY)
Troponin I (High Sensitivity): 75 ng/L — ABNORMAL HIGH (ref <18)
Troponin I (High Sensitivity): 148 ng/L (ref <18)
Troponin I (High Sensitivity): 142 ng/L (ref <18)

## 2021-04-22 LAB — CBG MONITORING, ED: Glucose-Capillary: 81 mg/dL (ref 70–99)

## 2021-04-22 LAB — GLUCOSE, CAPILLARY: Glucose-Capillary: 116 mg/dL — ABNORMAL HIGH (ref 70–99)

## 2021-04-22 LAB — TSH: TSH: 1.708 u[IU]/mL (ref 0.350–4.500)

## 2021-04-22 MED ORDER — ROSUVASTATIN CALCIUM 20 MG PO TABS
40.0000 mg | ORAL_TABLET | Freq: Every day | ORAL | Status: DC
  Administered 2021-04-22 – 2021-04-24 (×3): 40 mg via ORAL
  Filled 2021-04-22 (×3): qty 2

## 2021-04-22 MED ORDER — LORAZEPAM 2 MG/ML IJ SOLN
0.5000 mg | Freq: Once | INTRAMUSCULAR | Status: AC
  Administered 2021-04-22: 0.5 mg via INTRAVENOUS
  Filled 2021-04-22: qty 1

## 2021-04-22 MED ORDER — SODIUM CHLORIDE 0.9 % WEIGHT BASED INFUSION: 1.0000 mL/kg/h | INTRAVENOUS | Status: DC

## 2021-04-22 MED ORDER — ASPIRIN 81 MG PO CHEW
81.0000 mg | CHEWABLE_TABLET | Freq: Once | ORAL | Status: AC
  Administered 2021-04-22: 81 mg via ORAL
  Filled 2021-04-22: qty 1

## 2021-04-22 MED ORDER — LOSARTAN POTASSIUM 50 MG PO TABS
50.0000 mg | ORAL_TABLET | Freq: Every day | ORAL | Status: DC
  Administered 2021-04-22 – 2021-04-24 (×2): 50 mg via ORAL
  Filled 2021-04-22 (×2): qty 1

## 2021-04-22 MED ORDER — SODIUM CHLORIDE 0.9% FLUSH
3.0000 mL | Freq: Two times a day (BID) | INTRAVENOUS | Status: DC
Start: 2021-04-22 — End: 2021-04-24
  Administered 2021-04-22 – 2021-04-24 (×4): 3 mL via INTRAVENOUS

## 2021-04-22 MED ORDER — SODIUM CHLORIDE 0.9% FLUSH: 3.0000 mL | INTRAVENOUS | Status: DC | PRN

## 2021-04-22 MED ORDER — SODIUM CHLORIDE 0.9 % IV SOLN: 250.0000 mL | INTRAVENOUS | Status: DC | PRN

## 2021-04-22 MED ORDER — ONDANSETRON HCL 4 MG/2ML IJ SOLN
4.0000 mg | Freq: Once | INTRAMUSCULAR | Status: AC
  Administered 2021-04-22: 4 mg via INTRAVENOUS
  Filled 2021-04-22: qty 2

## 2021-04-22 MED ORDER — NITROGLYCERIN 0.4 MG SL SUBL
0.4000 mg | SUBLINGUAL_TABLET | SUBLINGUAL | Status: DC | PRN
Start: 2021-04-22 — End: 2021-04-24
  Administered 2021-04-22 – 2021-04-23 (×2): 0.4 mg via SUBLINGUAL
  Filled 2021-04-22 (×3): qty 1

## 2021-04-22 MED ORDER — NICOTINE 21 MG/24HR TD PT24
21.0000 mg | MEDICATED_PATCH | Freq: Every day | TRANSDERMAL | Status: DC
  Administered 2021-04-22 – 2021-04-24 (×3): 21 mg via TRANSDERMAL
  Filled 2021-04-22 (×3): qty 1

## 2021-04-22 MED ORDER — ACETAMINOPHEN 325 MG PO TABS: 650.0000 mg | ORAL_TABLET | ORAL | Status: DC | PRN

## 2021-04-22 MED ORDER — ASPIRIN 81 MG PO CHEW
81.0000 mg | CHEWABLE_TABLET | ORAL | Status: AC
  Administered 2021-04-23: 81 mg via ORAL
  Filled 2021-04-22: qty 1

## 2021-04-22 MED ORDER — HEPARIN (PORCINE) 25000 UT/250ML-% IV SOLN
1400.0000 [IU]/h | INTRAVENOUS | Status: DC
  Administered 2021-04-22 – 2021-04-23 (×2): 1400 [IU]/h via INTRAVENOUS
  Filled 2021-04-22 (×2): qty 250

## 2021-04-22 MED ORDER — ASPIRIN EC 81 MG PO TBEC
81.0000 mg | DELAYED_RELEASE_TABLET | Freq: Every day | ORAL | Status: DC
Start: 2021-04-23 — End: 2021-04-24
  Administered 2021-04-24: 81 mg via ORAL
  Filled 2021-04-22: qty 1

## 2021-04-22 MED ORDER — SODIUM CHLORIDE 0.9 % WEIGHT BASED INFUSION
3.0000 mL/kg/h | INTRAVENOUS | Status: DC
  Administered 2021-04-23: 3 mL/kg/h via INTRAVENOUS

## 2021-04-22 MED ORDER — ONDANSETRON HCL 4 MG/2ML IJ SOLN: 4.0000 mg | Freq: Four times a day (QID) | INTRAMUSCULAR | Status: DC | PRN

## 2021-04-22 MED ORDER — HEPARIN BOLUS VIA INFUSION
4000.0000 [IU] | Freq: Once | INTRAVENOUS | Status: AC
  Administered 2021-04-22: 4000 [IU] via INTRAVENOUS
  Filled 2021-04-22: qty 4000

## 2021-04-22 MED ORDER — INSULIN ASPART 100 UNIT/ML IJ SOLN
0.0000 [IU] | Freq: Three times a day (TID) | INTRAMUSCULAR | Status: DC
  Administered 2021-04-23: 1 [IU] via SUBCUTANEOUS

## 2021-04-22 MED ORDER — LABETALOL HCL 100 MG PO TABS
100.0000 mg | ORAL_TABLET | Freq: Two times a day (BID) | ORAL | Status: DC
  Administered 2021-04-22 – 2021-04-24 (×4): 100 mg via ORAL
  Filled 2021-04-22 (×5): qty 1

## 2021-04-22 NOTE — ED Triage Notes (Signed)
Pt from home with ems c.o chest pain lasting about 1 hour, pt took 243 ASA at home prior to calling EMS. Pt is pain free at this time, denies any other symptoms. Hx of HTN. Took all his morning meds today. VSS ? ?Pt states he has 2 similar episodes 2 weeks ago but was never seen by a doctor so he wanted to get checked out today ?

## 2021-04-22 NOTE — Progress Notes (Signed)
ANTICOAGULATION CONSULT NOTE - Initial Consult ? ?Pharmacy Consult for heparin ?Indication: chest pain/ACS ? ?No Known Allergies ? ?Patient Measurements: ?Height: '5\' 11"'$  (180.3 cm) ?Weight: 99.8 kg (220 lb) ?IBW/kg (Calculated) : 75.3 ?Heparin Dosing Weight: 96kg ? ?Vital Signs: ?Temp: 98.9 ?F (37.2 ?C) (04/02 6147) ?Temp Source: Oral (04/02 0929) ?BP: 159/76 (04/02 1100) ?Pulse Rate: 67 (04/02 1100) ? ?Labs: ?Recent Labs  ?  04/22/21 ?5747 04/22/21 ?1028  ?HGB 14.2  --   ?HCT 42.5  --   ?PLT 217  --   ?CREATININE 0.91  --   ?TROPONINIHS 75* 148*  ? ? ?Estimated Creatinine Clearance: 92.2 mL/min (by C-G formula based on SCr of 0.91 mg/dL). ? ? ?Medical History: ?Past Medical History:  ?Diagnosis Date  ? Anxiety   ? Arthritis   ? Depression   ? Diabetes mellitus without complication ([place])   ? Elevated plasma metanephrines   ? GERD (gastroesophageal reflux disease)   ? past hx   ? Hypertension   ? Kidney stones   ? ? ?Assessment: ?71 year old male being seen for chest pain and elevated cardiac enzymes. New orders received to start IV heparin. Does not appear to be on anticoagulants prior to admit. CBC within normal limits.  ? ?Goal of Therapy:  ?Heparin level 0.3-0.7 units/ml ?Monitor platelets by anticoagulation protocol: Yes ?  ?Plan:  ?Give 4000 units bolus x 1 ?Start heparin infusion at 1400 units/hr ?Check anti-Xa level in 8 hours and daily while on heparin ?Continue to monitor H&H and platelets ? ?Erin Hearing PharmD., BCPS ?Clinical Pharmacist ?04/22/2021 3:26 PM ? ?

## 2021-04-22 NOTE — ED Provider Notes (Signed)
?[facility] ?Provider Note ? ? ?CSN: [id number] ?Arrival date & time: 04/22/21  5400 ? ?  ? ?History ?Chief Complaint  ?Patient presents with  ? Chest Pain  ? ? ?Allen Whitehead is a 71 y.o. male with history of diabetes, hypertension, and tobacco abuse who presents to the emergency department with left-sided chest tightness that started when he woke up this morning.  Patient states he had another episode approximately 2 weeks ago which resolved on his own after approximately 5 to 10 minutes.  Today, patient had similar symptoms and had some chest pain that resolved after about 5 minutes.  Patient went outside to have a couple coffee and smoke a cigarette when the pain returned.  This prompted his arrival to the emergency department.  He denies any shortness of breath, nausea, vomiting, diarrhea, diaphoresis, fever, or chills. ? ?Patient was instructed to chew to aspirins prior to arrival.  Patient is currently asymptomatic.  Brought to the emergency department by EMS. ? ? ? ?Chest Pain ? ?  ? ?Home Medications ?Prior to Admission medications   ?Medication Sig Start Date End Date Taking? Authorizing Provider  ?labetalol (NORMODYNE) 100 MG tablet TAKE 1 TABLET BY MOUTH TWICE A DAY 04/21/21  Yes [name], MD  ?metFORMIN (GLUCOPHAGE) 500 MG tablet Take 500 mg by mouth every morning. 02/08/21  Yes [provider]  ?meclizine (ANTIVERT) 25 MG tablet Take 1 tablet (25 mg total) by mouth 3 (three) times daily as needed for dizziness. ?Patient not taking: Reported on 04/22/2021 05/10/20   Maximiano Coss, NP  ?olmesartan (BENICAR) 20 MG tablet Take 1 tablet (20 mg total) by mouth daily. ?Patient not taking: Reported on 04/22/2021 01/03/21   [name], MD  ?rosuvastatin (CRESTOR) 20 MG tablet Take 1 tablet (20 mg total) by mouth daily. ?Patient not taking: Reported on 04/22/2021 01/03/21   [name], MD  ?triamterene-hydrochlorothiazide (DYAZIDE) 37.5-25 MG capsule TAKE  1 EACH (1 CAPSULE TOTAL) BY MOUTH DAILY. ?Patient not taking: Reported on 04/22/2021 04/13/21   [name], MD  ?   ? ?Allergies    ?Patient has no known allergies.   ? ?Review of Systems   ?Review of Systems  ?Cardiovascular:  Positive for chest pain.  ?All other systems reviewed and are negative. ? ?Physical Exam ?Updated Vital Signs ?BP (!) 159/76   Pulse 67   Temp 98.9 ?F (37.2 ?C) (Oral)   Resp 14   Ht '5\' 11"'$  (1.803 m)   Wt 99.8 kg   SpO2 99%   BMI 30.68 kg/m?  ?Physical Exam ?Vitals and nursing note reviewed.  ?Constitutional:   ?   General: He is not in acute distress. ?   Appearance: Normal appearance.  ?HENT:  ?   Head: Normocephalic and atraumatic.  ?Eyes:  ?   General:     ?   Right eye: No discharge.     ?   Left eye: No discharge.  ?Cardiovascular:  ?   Comments: Regular rate and rhythm.  S1/S2 are distinct without any evidence of murmur, rubs, or gallops.  Radial pulses are 2+ bilaterally.  Dorsalis pedis pulses are 2+ bilaterally.  No evidence of pedal edema. ?Pulmonary:  ?   Comments: Clear to auscultation bilaterally.  Normal effort.  No respiratory distress.  No evidence of wheezes, rales, or rhonchi heard throughout. ?Chest:  ?   Comments: Anterior chest wall is nontender to palpation.  Chest pain nonreproducible. ?Abdominal:  ?  General: Abdomen is flat. Bowel sounds are normal. There is no distension.  ?   Tenderness: There is no abdominal tenderness. There is no guarding or rebound.  ?Musculoskeletal:     ?   General: Normal range of motion.  ?   Cervical back: Neck supple.  ?Skin: ?   General: Skin is warm and dry.  ?   Findings: No rash.  ?Neurological:  ?   General: No focal deficit present.  ?   Mental Status: He is alert.  ?Psychiatric:     ?   Mood and Affect: Mood normal.     ?   Behavior: Behavior normal.  ? ? ?ED Results / Procedures / Treatments   ?Labs ?(all labs ordered are listed, but only abnormal results are displayed) ?Labs Reviewed  ?BASIC METABOLIC PANEL - Abnormal;  Notable for the following components:  ?    Result Value  ? Glucose, Bld 227 (*)   ? All other components within normal limits  ?TROPONIN I (HIGH SENSITIVITY) - Abnormal; Notable for the following components:  ? Troponin I (High Sensitivity) 75 (*)   ? All other components within normal limits  ?TROPONIN I (HIGH SENSITIVITY) - Abnormal; Notable for the following components:  ? Troponin I (High Sensitivity) 148 (*)   ? All other components within normal limits  ?CBC  ?RAPID URINE DRUG SCREEN, HOSP PERFORMED  ? ? ?EKG ?EKG Interpretation ? ?Date/Time:  Sunday April 22 2021 08:34:36 EDT ?Ventricular Rate:  73 ?PR Interval:  176 ?QRS Duration: 100 ?QT Interval:  371 ?QTC Calculation: 409 ?R Axis:   59 ?Text Interpretation: Sinus rhythm No significant change since prior 10/15 Confirmed by Aletta Edouard [phone]) on 04/22/2021 8:36:30 AM ? ?Radiology ?DG Chest 2 View ? ?Result Date: 04/22/2021 ?CLINICAL DATA:  Chest pain EXAM: CHEST - 2 VIEW COMPARISON:  CT 03/22/2021 FINDINGS: The heart size and mediastinal contours are within normal limits. Both lungs are clear. The visualized skeletal structures are unremarkable. IMPRESSION: No active cardiopulmonary disease. Electronically Signed   By: Kerby Moors M.D.   On: 04/22/2021 10:13   ? ?Procedures ?Procedures  ? ? ?Medications Ordered in ED ?Medications  ?aspirin chewable tablet 81 mg (has no administration in time range)  ?insulin aspart (novoLOG) injection 0-9 Units (has no administration in time range)  ?rosuvastatin (CRESTOR) tablet 40 mg (has no administration in time range)  ?ondansetron (ZOFRAN) injection 4 mg (4 mg Intravenous Given 04/22/21 1245)  ?LORazepam (ATIVAN) injection 0.5 mg (0.5 mg Intravenous Given 04/22/21 1245)  ? ? ?ED Course/ Medical Decision Making/ A&P ?Clinical Course as of 04/22/21 1501  ?Sun Apr 22, 2021  ?0924 I discussed this case with my attending physician who cosigned this note including patient's presenting symptoms, physical exam, and planned  diagnostics and interventions. Attending physician stated agreement with plan or made changes to plan which were implemented.  ? ?Attending physician assessed patient at bedside. ? ? [CF]  ?7222 71 year old male with no prior cardiac history but does smoke, diabetes, hypertension complaining of chest pain this morning at rest.  Had a similar episode 2 weeks ago.  Currently pain-free.  Initial EKG does not show STEMI.  First troponin 75.  Will need to be trended and likely discussed with cardiology/admission [MB]  ?1211 Was notified by nursing staff that the patient had eloped.  She contact the patient who stated "I will just die at home."  The patient then handed the phone to another woman who stated that they were  on their way back. [CF]  ?40 I spoke with Dr. Gasper Sells with cardiology who agrees to admit the patient.   [CF]  ?  ?Clinical Course User Index ?[CF] Hendricks Limes, PA-C ?[MB] Hayden Rasmussen, MD  ? ?                        ?Medical Decision Making ?Amount and/or Complexity of Data Reviewed ?Labs: ordered. ?Radiology: ordered. ? ?Risk ?Prescription drug management. ?Decision regarding hospitalization. ? ? ?This patient presents to the ED for concern of chest pain, this involves an extensive number of treatment options, and is a complaint that carries with it a high risk of complications and morbidity.  The differential diagnosis includes ACS, atypical pneumonia, GERD.  Low suspicion for dissection despite him being slightly hypertensive.  Patient is currently asymptomatic at this time nor did I hear any murmurs he does not have any new neurological deficits.  I doubt pneumothorax and pulmonary embolism. ? ? ?Co morbidities that complicate the patient evaluation ? ?Past Medical History:  ?Diagnosis Date  ? Anxiety   ? Arthritis   ? Depression   ? Diabetes mellitus without complication ([place])   ? Elevated plasma metanephrines   ? GERD (gastroesophageal reflux disease)   ? past hx   ? Hypertension    ? Kidney stones   ? ? ?Additional history obtained: ? ?Additional history obtained from nursing note  ?External records from outside source obtained and reviewed.  I am unable to see any recent cardiology

## 2021-04-22 NOTE — ED Notes (Signed)
Pt requested food from Network engineer or he was leaving.  RN went into room to notify pt that he was npo and pt was not in room.  RN called pt and pt was already out of the hospital, stating that he was hungry.  Pt notified that he needed care for his heart.  Pt refused and gave phone to male.  Male stated she understood the severity of the situation and they would return. ?

## 2021-04-22 NOTE — ED Notes (Signed)
Pt has returned.  Provider notified.  Pt states he experienced pain when he was walking. ?

## 2021-04-22 NOTE — ED Notes (Signed)
Patient transported to x-ray. ?

## 2021-04-22 NOTE — H&P (Addendum)
?Cardiology Admission History and Physical:  ? ?Patient ID: Allen Whitehead ?MRN: [id number]; DOB: 08-25-1950  ? ?Admission date: 04/22/2021 ? ?PCP:  [name], MD ?  ?[facility] Providers ?Cardiologist: New - remotely saw Dr. Marlou Porch 2015 but did not need follow-up at that time - re-establishing, new to [name], MD      ? ?Chief Complaint:  chest pain ? ?Patient Profile:  ? ?Allen Whitehead is a 71 y.o. male with HTN, DM, anxiety, depression, kidney stones, tobacco a buse who is being seen 04/22/2021 for the evaluation of chest pain and elevated troponin. ? ?History of Present Illness:  ? ?[name] remotely saw Dr. Marlou Porch in 2015 for near syncope and postural tachycardia with largely reassuring echo with EF 60-65%, grade 1 DD, moderate LVH, did not need to follow-up with cardiology at that time. Lung CA screening CT in 03/2021 did show coronary calcification. He has a history of elevated metanephrines on labwork by PCP who ordered MR of the abdomen but insurance reportedly denied this. At last f/u, PCP planned to order renal duplex. Do not see this ordered but AAA duplex order is pending. The patient has multiple antihypertensives listed (labetalol, olmesartan, triamterene-HCTZ) but states there must have been a misunderstanding, he's only on one BP medicine - labetalol. Otherwise he only takes metformin. ? ?2 Sundays ago he had an episode of left sided chest pain lasting about 5 minutes that captured his attention but resolved spontaneously so he did not seek care. This morning around 6:30 while at rest the left sided chest pain recurred, lasted about 5 minutes, then dissipated without intervention. However, it then recurred and persisted longer than it had before, over 30 minutes. He had some SOB and nausea with this. No diaphoresis, palpitations, vomiting or syncope. He took '243mg'$  ASA at home and called EMS. By the time they arrived he was pain free. He denies receiving additional ASA or SL  NTG at that time. He was brought to the hospital and became frustrated and anxious at waiting for further evaluation so initially left, however, came back when he was called and told his labs were abnormal. He remains pain free at this time. Labs hsTroponin 75->148, K 4.5, glucose 227, CBC wnl, EKG NSR with TWI avL, otherwise normal. He is willing to stay for further workup. ? ? ?Past Medical History:  ?Diagnosis Date  ? Anxiety   ? Arthritis   ? Depression   ? Diabetes mellitus without complication ([place])   ? Elevated plasma metanephrines   ? GERD (gastroesophageal reflux disease)   ? past hx   ? Hypertension   ? Kidney stones   ? ? ?Past Surgical History:  ?Procedure Laterality Date  ? BIOPSY  02/15/2020  ? Procedure: BIOPSY;  Surgeon: [name], MD;  Location: [facility];  Service: Endoscopy;;  ? COLONOSCOPY    ? age 4 for blood in stool- Normal   ? COLONOSCOPY WITH PROPOFOL N/A 02/15/2020  ? Procedure: COLONOSCOPY WITH PROPOFOL;  Surgeon: [name], MD;  Location: [facility];  Service: Endoscopy;  Laterality: N/A;  ? ENDOSCOPIC MUCOSAL RESECTION  02/15/2020  ? Procedure: ENDOSCOPIC MUCOSAL RESECTION;  Surgeon: [name], MD;  Location: [facility];  Service: Endoscopy;;  ? kidney stone retrieval    ? lasar treatment on [place]    ? POLYPECTOMY  02/15/2020  ? Procedure: POLYPECTOMY;  Surgeon: [name], MD;  Location: [facility];  Service: Endoscopy;;  ? SUBMUCOSAL LIFTING  INJECTION  02/15/2020  ? Procedure: SUBMUCOSAL LIFTING INJECTION;  Surgeon: [name], MD;  Location: [facility];  Service: Endoscopy;;  ? VASECTOMY    ?  ? ?Medications Prior to Admission: ?Prior to Admission medications   ?Medication Sig Start Date End Date Taking? Authorizing Provider  ?labetalol (NORMODYNE) 100 MG tablet TAKE 1 TABLET BY MOUTH TWICE A DAY 04/21/21  Yes [name], MD  ?metFORMIN (GLUCOPHAGE) 500 MG tablet Take 500 mg by mouth every morning. 02/08/21  Yes [provider]  ?meclizine (ANTIVERT) 25 MG tablet Take 1 tablet (25 mg total) by mouth 3 (three) times daily as needed for dizziness. ?Patient not taking: Reported on 04/22/2021 05/10/20   Maximiano Coss, NP  ?olmesartan (BENICAR) 20 MG tablet Take 1 tablet (20 mg total) by mouth daily. ?Patient not taking: Reported on 04/22/2021 01/03/21   [name], MD  ?rosuvastatin (CRESTOR) 20 MG tablet Take 1 tablet (20 mg total) by mouth daily. ?Patient not taking: Reported on 04/22/2021 01/03/21   [name], MD  ?triamterene-hydrochlorothiazide (DYAZIDE) 37.5-25 MG capsule TAKE 1 EACH (1 CAPSULE TOTAL) BY MOUTH DAILY. ?Patient not taking: Reported on 04/22/2021 04/13/21   [name], MD  ?  ? ?Allergies:   No Known Allergies ? ?Social History:   ?Social History  ? ?Socioeconomic History  ? Marital status: Legally Separated  ?  Spouse name: Not on file  ? Number of children: Not on file  ? Years of education: Not on file  ? Highest education level: Not on file  ?Occupational History  ? Not on file  ?Tobacco Use  ? Smoking status: Every Day  ?  Packs/day: 1.00  ?  Years: 30.00  ?  Pack years: 30.00  ?  Types: Cigarettes  ? Smokeless tobacco: Never  ?Vaping Use  ? Vaping Use: Never used  ?Substance and Sexual Activity  ? Alcohol use: Yes  ?  Alcohol/week: 2.0 standard drinks  ?  Types: 2 Cans of beer per week  ?  Comment: occasionally  ? Drug use: No  ? Sexual activity: Yes  ?  Partners: Female  ?Other Topics Concern  ? Not on file  ?Social History Narrative  ? Not on file  ? ?Social Determinants of Health  ? ?Financial Resource Strain: Not on file  ?Food Insecurity: Not on file  ?Transportation Needs: Not on file  ?Physical Activity: Not on file  ?Stress: Not on file  ?Social Connections: Not on file  ?Intimate Partner Violence: Not on file  ?  ?Family History:   ?The patient's family history includes Alzheimer's disease in his mother; Cancer in his father and mother; Colon cancer in an other family member; Diabetes  in his father; Hypertension in his mother; Lung cancer in his brother and father. There is no history of Colon polyps, Esophageal cancer, Stomach cancer, or Rectal cancer.   ? ?ROS:  ?Please see the history of present illness.  ?All other ROS reviewed and negative.    ? ?Physical Exam/Data:  ? ?Vitals:  ? 04/22/21 0930 04/22/21 1000 04/22/21 1030 04/22/21 1100  ?BP: (!) 171/87 (!) 175/95 (!) 142/78 (!) 159/76  ?Pulse: 80 93 68 67  ?Resp: '13 13 12 14  '$ ?Temp:      ?TempSrc:      ?SpO2: 99% 100% 98% 99%  ?Weight:      ?Height:      ? ? ?Intake/Output Summary (Last 24 hours) at 04/22/2021 1515 ?Last data filed at  04/22/2021 1030 ?Gross per 24 hour  ?Intake --  ?Output 500 ml  ?Net -500 ml  ? ? ?  04/22/2021  ?  8:34 AM 02/07/2021  ?  1:31 PM 01/03/2021  ?  1:21 PM  ?Last 3 Weights  ?Weight (lbs) 220 lb 212 lb 6.4 oz 213 lb  ?Weight (kg) 99.791 kg 96.344 kg 96.616 kg  ?   ?Body mass index is 30.68 kg/m?.  ?General: Well developed, well nourished WM, in no acute distress. ?Head: Normocephalic, atraumatic, sclera non-icteric, no xanthomas, nares are without discharge. ?Neck: Negative for carotid bruits. JVP marginally elevated. ?Lungs: Clear bilaterally to auscultation without wheezes, rales, or rhonchi. Breathing is unlabored. ?Heart: RRR S1 S2 without murmurs, rubs, or gallops.  ?Abdomen: Soft, non-tender, non-distended with normoactive bowel sounds. No rebound/guarding. ?Extremities: No clubbing or cyanosis. No edema. Distal pedal pulses are 2+ and equal bilaterally. ?Neuro: Alert and oriented X 3. Moves all extremities spontaneously. ?Psych:  Responds to questions appropriately with a normal affect. ? ? ? ?EKG:  The ECG that was done today was personally reviewed and demonstrates NSR 73bpm, TWI avL, no acute STT changes otherwise ? ?Relevant CV Studies: ? ?2D echo 2015 ?- Left ventricle: The cavity size was normal. There was moderate  ?  concentric hypertrophy. Systolic function was normal. The  ?  estimated ejection fraction  was in the range of 60% to 65%. Wall  ?  motion was normal; there were no regional wall motion  ?  abnormalities. Doppler parameters are consistent with abnormal  ?  left ventricular relaxation (grade 1 di

## 2021-04-23 ENCOUNTER — Other Ambulatory Visit (HOSPITAL_COMMUNITY): Payer: Self-pay

## 2021-04-23 ENCOUNTER — Encounter (HOSPITAL_COMMUNITY): Payer: Self-pay | Admitting: Interventional Cardiology

## 2021-04-23 ENCOUNTER — Encounter (HOSPITAL_COMMUNITY)
Admission: EM | Disposition: A | Discharge status: Home / Self Care | Payer: Self-pay | Source: Home / Self Care | Attending: Internal Medicine

## 2021-04-23 ENCOUNTER — Inpatient Hospital Stay (HOSPITAL_COMMUNITY): Payer: Medicare HMO

## 2021-04-23 DIAGNOSIS — I2510 Atherosclerotic heart disease of native coronary artery without angina pectoris: Secondary | ICD-10-CM

## 2021-04-23 DIAGNOSIS — I214 Non-ST elevation (NSTEMI) myocardial infarction: Secondary | ICD-10-CM | POA: Diagnosis not present

## 2021-04-23 HISTORY — PX: LEFT HEART CATH AND CORONARY ANGIOGRAPHY: CATH118249

## 2021-04-23 HISTORY — PX: CORONARY STENT INTERVENTION: CATH118234

## 2021-04-23 LAB — BASIC METABOLIC PANEL
Anion gap: 6 (ref 5–15)
BUN: 10 mg/dL (ref 8–23)
CO2: 27 mmol/L (ref 22–32)
Calcium: 9 mg/dL (ref 8.9–10.3)
Chloride: 107 mmol/L (ref 98–111)
Creatinine, Ser: 0.98 mg/dL (ref 0.61–1.24)
GFR, Estimated: >60 mL/min (ref >60)
Glucose, Bld: 119 mg/dL — ABNORMAL HIGH (ref 70–99)
Potassium: 4.3 mmol/L (ref 3.5–5.1)
Sodium: 140 mmol/L (ref 135–145)

## 2021-04-23 LAB — ECHOCARDIOGRAM COMPLETE
Area-P 1/2: 3.72 cm2
Calc EF: 63.9 %
Height: 71 in
S' Lateral: 2.45 cm
Single Plane A2C EF: 68.6 %
Single Plane A4C EF: 58.4 %
Weight: 3358.05 oz

## 2021-04-23 LAB — POCT ACTIVATED CLOTTING TIME
Activated Clotting Time: 329 seconds
Activated Clotting Time: 395 seconds
Activated Clotting Time: 287 seconds

## 2021-04-23 LAB — HEPATIC FUNCTION PANEL
ALT: 22 U/L (ref 0–44)
AST: 23 U/L (ref 15–41)
Albumin: 3.5 g/dL (ref 3.5–5.0)
Alkaline Phosphatase: 70 U/L (ref 38–126)
Bilirubin, Direct: <0.1 mg/dL (ref 0.0–0.2)
Total Bilirubin: 0.2 mg/dL — ABNORMAL LOW (ref 0.3–1.2)
Total Protein: 6.6 g/dL (ref 6.5–8.1)

## 2021-04-23 LAB — TROPONIN I (HIGH SENSITIVITY): Troponin I (High Sensitivity): 71 ng/L — ABNORMAL HIGH (ref <18)

## 2021-04-23 LAB — LIPID PANEL
Cholesterol: 168 mg/dL (ref 0–200)
HDL: 32 mg/dL — ABNORMAL LOW (ref >40)
LDL Cholesterol: 116 mg/dL — ABNORMAL HIGH (ref 0–99)
Total CHOL/HDL Ratio: 5.3 RATIO
Triglycerides: 99 mg/dL (ref <150)
VLDL: 20 mg/dL (ref 0–40)

## 2021-04-23 LAB — GLUCOSE, CAPILLARY
Glucose-Capillary: 145 mg/dL — ABNORMAL HIGH (ref 70–99)
Glucose-Capillary: 108 mg/dL — ABNORMAL HIGH (ref 70–99)
Glucose-Capillary: 71 mg/dL (ref 70–99)
Glucose-Capillary: 118 mg/dL — ABNORMAL HIGH (ref 70–99)

## 2021-04-23 LAB — CBC
HCT: 39.3 % (ref 39.0–52.0)
Hemoglobin: 13 g/dL (ref 13.0–17.0)
MCH: 29.7 pg (ref 26.0–34.0)
MCHC: 33.1 g/dL (ref 30.0–36.0)
MCV: 89.7 fL (ref 80.0–100.0)
Platelets: 212 10*3/uL (ref 150–400)
RBC: 4.38 MIL/uL (ref 4.22–5.81)
RDW: 13.9 % (ref 11.5–15.5)
WBC: 8 10*3/uL (ref 4.0–10.5)
nRBC: 0 % (ref 0.0–0.2)

## 2021-04-23 LAB — HEPARIN LEVEL (UNFRACTIONATED)
Heparin Unfractionated: 0.38 IU/mL (ref 0.30–0.70)
Heparin Unfractionated: 0.45 IU/mL (ref 0.30–0.70)

## 2021-04-23 SURGERY — LEFT HEART CATH AND CORONARY ANGIOGRAPHY
Anesthesia: LOCAL

## 2021-04-23 MED ORDER — MIDAZOLAM HCL 2 MG/2ML IJ SOLN
INTRAMUSCULAR | Status: AC
  Filled 2021-04-23: qty 2

## 2021-04-23 MED ORDER — ASPIRIN 81 MG PO CHEW: 81.0000 mg | CHEWABLE_TABLET | Freq: Every day | ORAL | Status: DC

## 2021-04-23 MED ORDER — HEPARIN (PORCINE) IN NACL 1000-0.9 UT/500ML-% IV SOLN
INTRAVENOUS | Status: DC | PRN
  Administered 2021-04-23 (×3): 500 mL

## 2021-04-23 MED ORDER — VERAPAMIL HCL 2.5 MG/ML IV SOLN
INTRAVENOUS | Status: AC
  Filled 2021-04-23: qty 2

## 2021-04-23 MED ORDER — NITROGLYCERIN 1 MG/10 ML FOR IR/CATH LAB
INTRA_ARTERIAL | Status: AC
  Filled 2021-04-23: qty 10

## 2021-04-23 MED ORDER — FENTANYL CITRATE (PF) 100 MCG/2ML IJ SOLN
INTRAMUSCULAR | Status: DC | PRN
  Administered 2021-04-23 (×2): 25 ug via INTRAVENOUS

## 2021-04-23 MED ORDER — VERAPAMIL HCL 2.5 MG/ML IV SOLN
INTRAVENOUS | Status: DC | PRN
  Administered 2021-04-23: 10 mL via INTRA_ARTERIAL

## 2021-04-23 MED ORDER — IOHEXOL 350 MG/ML SOLN
INTRAVENOUS | Status: DC | PRN
  Administered 2021-04-23: 100 mL

## 2021-04-23 MED ORDER — ACETAMINOPHEN 325 MG PO TABS: 650.0000 mg | ORAL_TABLET | ORAL | Status: DC | PRN

## 2021-04-23 MED ORDER — LIDOCAINE HCL (PF) 1 % IJ SOLN
INTRAMUSCULAR | Status: DC | PRN
  Administered 2021-04-23: 2 mL

## 2021-04-23 MED ORDER — LABETALOL HCL 5 MG/ML IV SOLN: 10.0000 mg | INTRAVENOUS | Status: AC | PRN

## 2021-04-23 MED ORDER — MIDAZOLAM HCL 2 MG/2ML IJ SOLN
INTRAMUSCULAR | Status: DC | PRN
  Administered 2021-04-23: 1 mg via INTRAVENOUS
  Administered 2021-04-23: 2 mg via INTRAVENOUS

## 2021-04-23 MED ORDER — LIDOCAINE HCL (PF) 1 % IJ SOLN
INTRAMUSCULAR | Status: AC
  Filled 2021-04-23: qty 30

## 2021-04-23 MED ORDER — PERFLUTREN LIPID MICROSPHERE
1.0000 mL | INTRAVENOUS | Status: AC | PRN
  Administered 2021-04-23: 2 mL via INTRAVENOUS
  Filled 2021-04-23: qty 10

## 2021-04-23 MED ORDER — TICAGRELOR 90 MG PO TABS
90.0000 mg | ORAL_TABLET | Freq: Two times a day (BID) | ORAL | Status: DC
  Administered 2021-04-23 – 2021-04-24 (×2): 90 mg via ORAL
  Filled 2021-04-23 (×2): qty 1

## 2021-04-23 MED ORDER — HYDRALAZINE HCL 20 MG/ML IJ SOLN: 10.0000 mg | INTRAMUSCULAR | Status: AC | PRN

## 2021-04-23 MED ORDER — TICAGRELOR 90 MG PO TABS
ORAL_TABLET | ORAL | Status: DC | PRN
  Administered 2021-04-23: 180 mg via ORAL

## 2021-04-23 MED ORDER — SODIUM CHLORIDE 0.9 % IV SOLN: INTRAVENOUS | Status: AC

## 2021-04-23 MED ORDER — SODIUM CHLORIDE 0.9% FLUSH: 3.0000 mL | INTRAVENOUS | Status: DC | PRN

## 2021-04-23 MED ORDER — TICAGRELOR 90 MG PO TABS
ORAL_TABLET | ORAL | Status: AC
  Filled 2021-04-23: qty 2

## 2021-04-23 MED ORDER — ONDANSETRON HCL 4 MG/2ML IJ SOLN: 4.0000 mg | Freq: Four times a day (QID) | INTRAMUSCULAR | Status: DC | PRN

## 2021-04-23 MED ORDER — NITROGLYCERIN 1 MG/10 ML FOR IR/CATH LAB
INTRA_ARTERIAL | Status: DC | PRN
  Administered 2021-04-23: 400 ug
  Administered 2021-04-23 (×2): 200 ug

## 2021-04-23 MED ORDER — FENTANYL CITRATE (PF) 100 MCG/2ML IJ SOLN
INTRAMUSCULAR | Status: AC
  Filled 2021-04-23: qty 2

## 2021-04-23 MED ORDER — HEPARIN SODIUM (PORCINE) 1000 UNIT/ML IJ SOLN
INTRAMUSCULAR | Status: DC | PRN
  Administered 2021-04-23: 5000 [IU] via INTRAVENOUS
  Administered 2021-04-23: 2000 [IU] via INTRAVENOUS
  Administered 2021-04-23: 7000 [IU] via INTRAVENOUS

## 2021-04-23 MED ORDER — SODIUM CHLORIDE 0.9 % IV SOLN: 250.0000 mL | INTRAVENOUS | Status: DC | PRN

## 2021-04-23 MED ORDER — HEPARIN SODIUM (PORCINE) 1000 UNIT/ML IJ SOLN
INTRAMUSCULAR | Status: AC
  Filled 2021-04-23: qty 10

## 2021-04-23 MED ORDER — SODIUM CHLORIDE 0.9% FLUSH
3.0000 mL | Freq: Two times a day (BID) | INTRAVENOUS | Status: DC
  Administered 2021-04-23 – 2021-04-24 (×3): 3 mL via INTRAVENOUS

## 2021-04-23 MED ORDER — HEPARIN (PORCINE) IN NACL 1000-0.9 UT/500ML-% IV SOLN
INTRAVENOUS | Status: AC
  Filled 2021-04-23: qty 1000

## 2021-04-23 SURGICAL SUPPLY — 20 items
BALLN ~~LOC~~ EUPHORA RX 3.75X12 (BALLOONS) ×2
BALLOON ~~LOC~~ EUPHORA RX 3.75X12 (BALLOONS) IMPLANT
BAND ZEPHYR COMPRESS 30 LONG (HEMOSTASIS) ×1 IMPLANT
CATH 5FR JL3.5 JR4 ANG PIG MP (CATHETERS) ×1 IMPLANT
CATH LAUNCHER 6FR EBU3.5 (CATHETERS) ×1 IMPLANT
CATH OPTICROSS HD (CATHETERS) ×1 IMPLANT
GLIDESHEATH SLEND SS 6F .021 (SHEATH) ×1 IMPLANT
GUIDEWIRE INQWIRE 1.5J.035X260 (WIRE) IMPLANT
INQWIRE 1.5J .035X260CM (WIRE) ×2
KIT ENCORE 26 ADVANTAGE (KITS) ×1 IMPLANT
KIT HEART LEFT (KITS) ×2 IMPLANT
KIT HEMO VALVE WATCHDOG (MISCELLANEOUS) ×1 IMPLANT
PACK CARDIAC CATHETERIZATION (CUSTOM PROCEDURE TRAY) ×2 IMPLANT
SLED PULL BACK IVUS (MISCELLANEOUS) ×1 IMPLANT
STENT ONYX FRONTIER 2.5X15 (Permanent Stent) IMPLANT
STENT ONYX FRONTIER 3.5X18 (Permanent Stent) ×1 IMPLANT
SYR MEDRAD MARK 7 150ML (SYRINGE) ×2 IMPLANT
TRANSDUCER W/STOPCOCK (MISCELLANEOUS) ×2 IMPLANT
TUBING CIL FLEX 10 FLL-RA (TUBING) ×2 IMPLANT
WIRE ASAHI PROWATER 180CM (WIRE) ×1 IMPLANT

## 2021-04-23 NOTE — Progress Notes (Signed)
? ?Progress Note ? ?Patient Name: Allen Whitehead ?Date of Encounter: 04/23/2021 ? ?Primary Cardiologist: [name], MD  ? ?Subjective  ? ?Blood pressure has improved since interval. ?CP has been intermittent overnight and improve with PRN nitro. ?NPO for LHC. ?Troponin peaked at 142 hs. ? ? ?Inpatient Medications  ?  ?Scheduled Meds: ? aspirin EC  81 mg Oral Daily  ? insulin aspart  0-9 Units Subcutaneous TID WC  ? labetalol  100 mg Oral BID  ? losartan  50 mg Oral Daily  ? nicotine  21 mg Transdermal Daily  ? rosuvastatin  40 mg Oral Daily  ? sodium chloride flush  3 mL Intravenous Q12H  ? ?Continuous Infusions: ? sodium chloride    ? sodium chloride 1 mL/kg/hr (04/23/21 7106)  ? heparin 1,400 Units/hr (04/23/21 0714)  ? ?PRN Meds: ?sodium chloride, acetaminophen, nitroGLYCERIN, ondansetron (ZOFRAN) IV, sodium chloride flush  ? ?Vital Signs  ?  ?Vitals:  ? 04/22/21 2142 04/22/21 2337 04/23/21 0333 04/23/21 0509  ?BP: 133/61 122/75 110/66   ?Pulse: 79 73 68   ?Resp:  15 16   ?Temp:  97.7 ?F (36.5 ?C) 97.7 ?F (36.5 ?C)   ?TempSrc:  Oral Oral   ?SpO2:  96% 98%   ?Weight:    95.2 kg  ?Height:      ? ? ?Intake/Output Summary (Last 24 hours) at 04/23/2021 0748 ?Last data filed at 04/23/2021 0437 ?Gross per 24 hour  ?Intake 0 ml  ?Output 1500 ml  ?Net -1500 ml  ? ?Filed Weights  ? 04/22/21 0834 04/23/21 0509  ?Weight: 99.8 kg 95.2 kg  ? ? ?Telemetry  ?  ?SR-> sinus tachycardia - Personally Reviewed ? ? ?Physical Exam  ? ?Gen: no distress  ?Neck: No JVD, no carotid bruit ?Cardiac: No Rubs or Gallops, no Murmur, RRR +2 radial and femoral pulses ?Respiratory: Clear to auscultation bilaterally, normal effort, normal  respiratory rate ?GI: Soft, nontender, non-distended  ?MS: No  edema;  moves all extremities ?Integument: Skin feels warm ?Neuro:  At time of evaluation, alert and oriented to person/place/time/situation  ?Psych: Normal affect, patient feels fine ? ? ?Labs  ?  ?Chemistry ?Recent Labs  ?Lab  04/22/21 ?2694 04/23/21 ?8546  ?NA 137 140  ?K 4.5 4.3  ?CL 106 107  ?CO2 25 27  ?GLUCOSE 227* 119*  ?BUN 9 10  ?CREATININE 0.91 0.98  ?CALCIUM 9.1 9.0  ?PROT  --  6.6  ?ALBUMIN  --  3.5  ?AST  --  23  ?ALT  --  22  ?ALKPHOS  --  70  ?BILITOT  --  0.2*  ?GFRNONAA >60 >60  ?ANIONGAP 6 6  ?  ? ?Hematology ?Recent Labs  ?Lab 04/22/21 ?2703 04/23/21 ?5009  ?WBC 6.9 8.0  ?RBC 4.71 4.38  ?HGB 14.2 13.0  ?HCT 42.5 39.3  ?MCV 90.2 89.7  ?MCH 30.1 29.7  ?MCHC 33.4 33.1  ?RDW 14.0 13.9  ?PLT 217 212  ? ? ?Cardiac EnzymesNo results for input(s): TROPONINI in the last 168 hours. No results for input(s): TROPIPOC in the last 168 hours.  ? ?BNPNo results for input(s): BNP, PROBNP in the last 168 hours.  ? ?DDimer No results for input(s): DDIMER in the last 168 hours.  ? ?Radiology  ?  ?DG Chest 2 View ? ?Result Date: 04/22/2021 ?CLINICAL DATA:  Chest pain EXAM: CHEST - 2 VIEW COMPARISON:  CT 03/22/2021 FINDINGS: The heart size and mediastinal contours are within normal limits. Both lungs are clear.  The visualized skeletal structures are unremarkable. IMPRESSION: No active cardiopulmonary disease. Electronically Signed   By: Kerby Moors M.D.   On: 04/22/2021 10:13   ? ? ?Patient Profile  ?   ?71 y.o. male HTN, DM, Tobacco abuse, Rheumatoid arthritis, Elevated plasma metanephrines NOS (unable to get further evaluation with insurance issues), CAC and aortic atherosclerosis who presents for NSTEMI ? ?Assessment & Plan  ?  ?NSTEMI ?- small troponin elevation but concerns for plaque rupture event vs vasospasm (post cigarette angina) ?Tobacco abuse ?Rheumatoid athritis ?- CAC and Aortia atherosclerosis ?- ASA 81 mg PO daily ?- heparin until post cath ?- continue labetalol 100 mg; LVEF preserved and no WMA on limited echo; pending full study completion ?- nictotine patch; will offer at discharge; patient notes only joys in life are cigarettes and coffee ?- losartan 50 mg Po daily can return to home benicar at DC ?- increase rosuvastatin  to 40 mg Po daily ?- hold metformin; if doing well could do late discharge but would hold this for tomorrow ?- will DC with PRN nitro (none documented today but reported per patient) ?- if non obstructive will need post cath follow up me or APP ? ?For questions or updates, please contact [place] ?Please consult [email or web address] for contact info under Cardiology/STEMI. ?  ?   ?Signed, ?[name], MD  ?04/23/2021, 7:48 AM   ? ?

## 2021-04-23 NOTE — Progress Notes (Signed)
Mobility Specialist Progress Note  ? ? 04/23/21 1721  ?Mobility  ?Activity Ambulated with assistance in hallway  ?Level of Assistance Contact guard assist, steadying assist  ?Assistive Device Other (Comment) ?(IV pole)  ?Distance Ambulated (ft) 420 ft  ?Activity Response Tolerated well  ?$Mobility charge 1 Mobility  ? ?Pt received sitting EOB and agreeable. C/o some dizziness from vertigo upon return. Left sitting EOB with call bell in reach.  ? ?Allen Whitehead ?Mobility Specialist  ?  ?

## 2021-04-23 NOTE — TOC Benefit Eligibility Note (Signed)
Patient Advocate Encounter ? ?Insurance verification completed.   ? ?The patient is currently admitted and upon discharge could be taking Brilinta 90 mg. ? ?The current 30 day co-pay is, $100.00.  ? ?The patient is insured through [employer] Part D  ? ? ? ?Lyndel Safe, CPhT ?Pharmacy Patient Advocate Specialist ?[place] Patient Advocate Team ?Direct Number: [phone]  Fax: [phone] ? ? ? ? ? ?  ?

## 2021-04-23 NOTE — Progress Notes (Signed)
ANTICOAGULATION CONSULT NOTE ? ?Pharmacy Consult for heparin ?Indication: chest pain/ACS ? ?No Known Allergies ? ?Patient Measurements: ?Height: '5\' 11"'$  (180.3 cm) ?Weight: 95.2 kg (209 lb 14.1 oz) ?IBW/kg (Calculated) : 75.3 ?Heparin Dosing Weight: 96kg ? ?Vital Signs: ?Temp: 97.9 ?F (36.6 ?C) (04/03 6283) ?Temp Source: Oral (04/03 0739) ?BP: 104/52 (04/03 0739) ?Pulse Rate: 82 (04/03 0739) ? ?Labs: ?Recent Labs  ?  04/22/21 ?1517 04/22/21 ?1028 04/22/21 ?1517 04/23/21 ?6160 04/23/21 ?0735  ?HGB 14.2  --   --  13.0  --   ?HCT 42.5  --   --  39.3  --   ?PLT 217  --   --  212  --   ?HEPARINUNFRC  --   --   --  0.38 0.45  ?CREATININE 0.91  --   --  0.98  --   ?TROPONINIHS 75* 148* 142* 71*  --   ? ? ? ?Estimated Creatinine Clearance: 83.8 mL/min (by C-G formula based on SCr of 0.98 mg/dL). ? ? ?Medical History: ?Past Medical History:  ?Diagnosis Date  ? Anxiety   ? Arthritis   ? Depression   ? Diabetes mellitus without complication ([place])   ? Elevated plasma metanephrines   ? GERD (gastroesophageal reflux disease)   ? past hx   ? Hypertension   ? Kidney stones   ? ? ?Assessment: ?71 year old male being seen for chest pain and elevated cardiac enzymes. New orders received to start IV heparin. Does not appear to be on anticoagulants prior to admit. CBC within normal limits.  ? ?Heparin level this morning remains in goal range.  No overt bleeding or complications noted, CBC stable. ? ?Goal of Therapy:  ?Heparin level 0.3-0.7 units/ml ?Monitor platelets by anticoagulation protocol: Yes ?  ?Plan:  ?Continue IV heparin at current rate of 1400 units/hr. ?Daily heparin level and CBC. ?F/u plans for further cardiac workup. ? ?[place] Crane, Pharm D, BCPS, BCCP ?Clinical Pharmacist ? 04/23/2021 9:36 AM  ? ?[facility] pharmacy phone numbers are listed on [email or web address] ? ? ?

## 2021-04-23 NOTE — Plan of Care (Signed)

## 2021-04-23 NOTE — H&P (View-Only) (Signed)
? ?Progress Note ? ?Patient Name: Allen Whitehead ?Date of Encounter: 04/23/2021 ? ?Primary Cardiologist: [name], MD  ? ?Subjective  ? ?Blood pressure has improved since interval. ?CP has been intermittent overnight and improve with PRN nitro. ?NPO for LHC. ?Troponin peaked at 142 hs. ? ? ?Inpatient Medications  ?  ?Scheduled Meds: ? aspirin EC  81 mg Oral Daily  ? insulin aspart  0-9 Units Subcutaneous TID WC  ? labetalol  100 mg Oral BID  ? losartan  50 mg Oral Daily  ? nicotine  21 mg Transdermal Daily  ? rosuvastatin  40 mg Oral Daily  ? sodium chloride flush  3 mL Intravenous Q12H  ? ?Continuous Infusions: ? sodium chloride    ? sodium chloride 1 mL/kg/hr (04/23/21 7035)  ? heparin 1,400 Units/hr (04/23/21 0714)  ? ?PRN Meds: ?sodium chloride, acetaminophen, nitroGLYCERIN, ondansetron (ZOFRAN) IV, sodium chloride flush  ? ?Vital Signs  ?  ?Vitals:  ? 04/22/21 2142 04/22/21 2337 04/23/21 0333 04/23/21 0509  ?BP: 133/61 122/75 110/66   ?Pulse: 79 73 68   ?Resp:  15 16   ?Temp:  97.7 ?F (36.5 ?C) 97.7 ?F (36.5 ?C)   ?TempSrc:  Oral Oral   ?SpO2:  96% 98%   ?Weight:    95.2 kg  ?Height:      ? ? ?Intake/Output Summary (Last 24 hours) at 04/23/2021 0748 ?Last data filed at 04/23/2021 0437 ?Gross per 24 hour  ?Intake 0 ml  ?Output 1500 ml  ?Net -1500 ml  ? ?Filed Weights  ? 04/22/21 0834 04/23/21 0509  ?Weight: 99.8 kg 95.2 kg  ? ? ?Telemetry  ?  ?SR-> sinus tachycardia - Personally Reviewed ? ? ?Physical Exam  ? ?Gen: no distress  ?Neck: No JVD, no carotid bruit ?Cardiac: No Rubs or Gallops, no Murmur, RRR +2 radial and femoral pulses ?Respiratory: Clear to auscultation bilaterally, normal effort, normal  respiratory rate ?GI: Soft, nontender, non-distended  ?MS: No  edema;  moves all extremities ?Integument: Skin feels warm ?Neuro:  At time of evaluation, alert and oriented to person/place/time/situation  ?Psych: Normal affect, patient feels fine ? ? ?Labs  ?  ?Chemistry ?Recent Labs  ?Lab  04/22/21 ?0093 04/23/21 ?8182  ?NA 137 140  ?K 4.5 4.3  ?CL 106 107  ?CO2 25 27  ?GLUCOSE 227* 119*  ?BUN 9 10  ?CREATININE 0.91 0.98  ?CALCIUM 9.1 9.0  ?PROT  --  6.6  ?ALBUMIN  --  3.5  ?AST  --  23  ?ALT  --  22  ?ALKPHOS  --  70  ?BILITOT  --  0.2*  ?GFRNONAA >60 >60  ?ANIONGAP 6 6  ?  ? ?Hematology ?Recent Labs  ?Lab 04/22/21 ?9937 04/23/21 ?1696  ?WBC 6.9 8.0  ?RBC 4.71 4.38  ?HGB 14.2 13.0  ?HCT 42.5 39.3  ?MCV 90.2 89.7  ?MCH 30.1 29.7  ?MCHC 33.4 33.1  ?RDW 14.0 13.9  ?PLT 217 212  ? ? ?Cardiac EnzymesNo results for input(s): TROPONINI in the last 168 hours. No results for input(s): TROPIPOC in the last 168 hours.  ? ?BNPNo results for input(s): BNP, PROBNP in the last 168 hours.  ? ?DDimer No results for input(s): DDIMER in the last 168 hours.  ? ?Radiology  ?  ?DG Chest 2 View ? ?Result Date: 04/22/2021 ?CLINICAL DATA:  Chest pain EXAM: CHEST - 2 VIEW COMPARISON:  CT 03/22/2021 FINDINGS: The heart size and mediastinal contours are within normal limits. Both lungs are clear.  The visualized skeletal structures are unremarkable. IMPRESSION: No active cardiopulmonary disease. Electronically Signed   By: Kerby Moors M.D.   On: 04/22/2021 10:13   ? ? ?Patient Profile  ?   ?71 y.o. male HTN, DM, Tobacco abuse, Rheumatoid arthritis, Elevated plasma metanephrines NOS (unable to get further evaluation with insurance issues), CAC and aortic atherosclerosis who presents for NSTEMI ? ?Assessment & Plan  ?  ?NSTEMI ?- small troponin elevation but concerns for plaque rupture event vs vasospasm (post cigarette angina) ?Tobacco abuse ?Rheumatoid athritis ?- CAC and Aortia atherosclerosis ?- ASA 81 mg PO daily ?- heparin until post cath ?- continue labetalol 100 mg; LVEF preserved and no WMA on limited echo; pending full study completion ?- nictotine patch; will offer at discharge; patient notes only joys in life are cigarettes and coffee ?- losartan 50 mg Po daily can return to home benicar at DC ?- increase rosuvastatin  to 40 mg Po daily ?- hold metformin; if doing well could do late discharge but would hold this for tomorrow ?- will DC with PRN nitro (none documented today but reported per patient) ?- if non obstructive will need post cath follow up me or APP ? ?For questions or updates, please contact [place] ?Please consult [email or web address] for contact info under Cardiology/STEMI. ?  ?   ?Signed, ?[name], MD  ?04/23/2021, 7:48 AM   ? ?

## 2021-04-23 NOTE — Progress Notes (Signed)
ANTICOAGULATION CONSULT NOTE ? ?Pharmacy Consult for heparin ?Indication: chest pain/ACS ? ?No Known Allergies ? ?Patient Measurements: ?Height: '5\' 11"'$  (180.3 cm) ?Weight: 95.2 kg (209 lb 14.1 oz) ?IBW/kg (Calculated) : 75.3 ?Heparin Dosing Weight: 96kg ? ?Vital Signs: ?Temp: 97.7 ?F (36.5 ?C) (04/03 4497) ?Temp Source: Oral (04/03 5300) ?BP: 110/66 (04/03 0333) ?Pulse Rate: 68 (04/03 0333) ? ?Labs: ?Recent Labs  ?  04/22/21 ?5110 04/22/21 ?1028 04/22/21 ?1517 04/23/21 ?2111  ?HGB 14.2  --   --  13.0  ?HCT 42.5  --   --  39.3  ?PLT 217  --   --  212  ?HEPARINUNFRC  --   --   --  0.38  ?CREATININE 0.91  --   --  0.98  ?TROPONINIHS 75* 148* 142* 71*  ? ? ? ?Estimated Creatinine Clearance: 83.8 mL/min (by C-G formula based on SCr of 0.98 mg/dL). ? ? ?Medical History: ?Past Medical History:  ?Diagnosis Date  ? Anxiety   ? Arthritis   ? Depression   ? Diabetes mellitus without complication ([place])   ? Elevated plasma metanephrines   ? GERD (gastroesophageal reflux disease)   ? past hx   ? Hypertension   ? Kidney stones   ? ? ?Assessment: ?71 year old male being seen for chest pain and elevated cardiac enzymes. New orders received to start IV heparin. Does not appear to be on anticoagulants prior to admit. CBC within normal limits.  ? ?Heparin level overnight last night was within goal range.  No overt bleeding or complications noted, CBC stable. ? ?Goal of Therapy:  ?Heparin level 0.3-0.7 units/ml ?Monitor platelets by anticoagulation protocol: Yes ?  ?Plan:  ?Continue IV heparin at current rate of 1400 units/hr. ?Repeat heparin level now to confirm. ?Daily heparin level and CBC. ?F/u plans for further cardiac workup. ? ?[place] Crane, Pharm D, BCPS, BCCP ?Clinical Pharmacist ? 04/23/2021 7:21 AM  ? ?[facility] pharmacy phone numbers are listed on [email or web address] ? ? ?

## 2021-04-23 NOTE — Progress Notes (Signed)
Patient returned from cath lab with [facility] on right radial, placed at 11:36AM with 14cc air---removed 3cc at 13:38 with no bleeding/complications--removed 3cc at 14:20 with no bleeding/complications--removed 3cc at 15:13 with no bleeding/complications--removed 3cc at 16:07 with no bleeding/complications--removed final 2cc at 16:45 with no bleeding/complications---remained level 0 with no hematoma ?

## 2021-04-23 NOTE — Interval H&P Note (Signed)
Cath Lab Visit (complete for each Cath Lab visit) ? ?Clinical Evaluation Leading to the Procedure:  ? ?ACS: Yes.   ? ?Non-ACS:   ? ?Anginal Classification: CCS IV ? ?Anti-ischemic medical therapy: Minimal Therapy (1 class of medications) ? ?Non-Invasive Test Results: No non-invasive testing performed ? ?Prior CABG: No previous CABG ? ? ? ? ? ?History and Physical Interval Note: ? ?04/23/2021 ?10:10 AM ? ?Allen Whitehead  has presented today for surgery, with the diagnosis of NSTEMI.  The various methods of treatment have been discussed with the patient and family. After consideration of risks, benefits and other options for treatment, the patient has consented to  Procedure(s): ?LEFT HEART CATH AND CORONARY ANGIOGRAPHY (N/A) as a surgical intervention.  The patient's history has been reviewed, patient examined, no change in status, stable for surgery.  I have reviewed the patient's chart and labs.  Questions were answered to the patient's satisfaction.   ? ? ?Allen Whitehead ? ? ?

## 2021-04-24 ENCOUNTER — Encounter (HOSPITAL_COMMUNITY): Payer: Self-pay | Admitting: Internal Medicine

## 2021-04-24 ENCOUNTER — Telehealth: Payer: Self-pay

## 2021-04-24 ENCOUNTER — Other Ambulatory Visit (HOSPITAL_COMMUNITY): Payer: Self-pay

## 2021-04-24 LAB — CBC
HCT: 37.9 % — ABNORMAL LOW (ref 39.0–52.0)
Hemoglobin: 12.7 g/dL — ABNORMAL LOW (ref 13.0–17.0)
MCH: 29.6 pg (ref 26.0–34.0)
MCHC: 33.5 g/dL (ref 30.0–36.0)
MCV: 88.3 fL (ref 80.0–100.0)
Platelets: 204 10*3/uL (ref 150–400)
RBC: 4.29 MIL/uL (ref 4.22–5.81)
RDW: 13.9 % (ref 11.5–15.5)
WBC: 8.7 10*3/uL (ref 4.0–10.5)
nRBC: 0 % (ref 0.0–0.2)

## 2021-04-24 LAB — BASIC METABOLIC PANEL
Anion gap: 6 (ref 5–15)
BUN: 12 mg/dL (ref 8–23)
CO2: 22 mmol/L (ref 22–32)
Calcium: 8.9 mg/dL (ref 8.9–10.3)
Chloride: 112 mmol/L — ABNORMAL HIGH (ref 98–111)
Creatinine, Ser: 0.78 mg/dL (ref 0.61–1.24)
GFR, Estimated: >60 mL/min (ref >60)
Glucose, Bld: 107 mg/dL — ABNORMAL HIGH (ref 70–99)
Potassium: 3.7 mmol/L (ref 3.5–5.1)
Sodium: 140 mmol/L (ref 135–145)

## 2021-04-24 LAB — GLUCOSE, CAPILLARY: Glucose-Capillary: 114 mg/dL — ABNORMAL HIGH (ref 70–99)

## 2021-04-24 MED ORDER — TICAGRELOR 90 MG PO TABS
90.0000 mg | ORAL_TABLET | Freq: Two times a day (BID) | ORAL | 3 refills | Status: DC
  Filled 2021-04-24 – 2021-05-21 (×3): qty 60, 30d supply, fill #0
  Filled 2021-06-27: qty 60, 30d supply, fill #1
  Filled 2021-08-15: qty 60, 30d supply, fill #2
  Filled 2021-11-06: qty 60, 30d supply, fill #3
  Filled 2022-02-08: qty 60, 30d supply, fill #4
  Filled 2022-04-12: qty 60, 30d supply, fill #5

## 2021-04-24 MED ORDER — ROSUVASTATIN CALCIUM 40 MG PO TABS
40.0000 mg | ORAL_TABLET | Freq: Every day | ORAL | 3 refills | Status: DC
Start: 2021-04-24 — End: 2022-04-29
  Filled 2021-04-24 – 2021-05-21 (×3): qty 30, 30d supply, fill #0
  Filled 2021-06-27: qty 30, 30d supply, fill #1
  Filled 2021-08-15: qty 30, 30d supply, fill #2
  Filled 2021-09-13: qty 90, 90d supply, fill #3
  Filled 2022-01-16: qty 90, 90d supply, fill #4

## 2021-04-24 MED ORDER — NITROGLYCERIN 0.4 MG SL SUBL
0.4000 mg | SUBLINGUAL_TABLET | SUBLINGUAL | 3 refills | Status: AC | PRN
  Filled 2021-04-24 – 2021-05-21 (×3): qty 25, 14d supply, fill #0

## 2021-04-24 MED ORDER — METFORMIN HCL 500 MG PO TABS: 500.0000 mg | ORAL_TABLET | Freq: Every morning | ORAL | Status: DC

## 2021-04-24 MED ORDER — OLMESARTAN MEDOXOMIL 20 MG PO TABS
20.0000 mg | ORAL_TABLET | Freq: Every day | ORAL | 3 refills | Status: DC
  Filled 2021-04-24 – 2021-05-01 (×2): qty 30, 30d supply, fill #0

## 2021-04-24 MED ORDER — ASPIRIN 81 MG PO TBEC
81.0000 mg | DELAYED_RELEASE_TABLET | Freq: Every day | ORAL | 11 refills | Status: DC
  Filled 2021-04-24 – 2021-05-21 (×3): qty 30, 30d supply, fill #0

## 2021-04-24 MED ORDER — NICOTINE 21 MG/24HR TD PT24
21.0000 mg | MEDICATED_PATCH | Freq: Every day | TRANSDERMAL | 0 refills | Status: DC
  Filled 2021-04-24 – 2021-05-01 (×2): qty 28, 28d supply, fill #0

## 2021-04-24 NOTE — Telephone Encounter (Signed)
**Note De-identified [place] Hanlon Obfuscation** -----  **Note De-Identified Mckenzey Parcell Obfuscation** Message from Ledora Bottcher, [place] sent at 04/24/2021  8:22 AM EDT ----- ?Pt needs a TOC visit in the next 14 days for post PCI with Dr. Gasper Sells or APP.  ? ?Thanks ?Angie ? ? ?

## 2021-04-24 NOTE — Telephone Encounter (Signed)
**Note De-Identified Lesleyanne Politte Obfuscation** The pt is being discharged today from [facility]. ?We will call tomorrow. ?

## 2021-04-24 NOTE — Discharge Summary (Addendum)
?Discharge Summary  ?  ?Patient ID: Allen Whitehead ?MRN: [id number]; DOB: [birth year] ? ?Admit date: 04/22/2021 ?Discharge date: 04/24/2021 ? ?PCP:  [name], MD ?  ?[facility] Providers ?Cardiologist:  [name], MD  ? ?Discharge Diagnoses  ?  ?Principal Problem: ?  NSTEMI (non-ST elevated myocardial infarction) ([place]) ?Active Problems: ?  Diabetes mellitus without complication ([place]) ?  Hyperlipidemia with target LDL less than 70 ?  Continuous tobacco abuse ?  Primary hypertension ?  LVH (left ventricular hypertrophy) due to hypertensive disease, without heart failure ? ? ?Diagnostic Studies/Procedures  ?  ?Left heart cath 04/23/21: ?  Prox LAD lesion is 80% stenosed. ?  A drug-eluting stent was successfully placed using a STENT ONYX FRONTIER 3.5X18, postdilated to 3.8 mm and optimized with intravascular ultrasound. ?  Mid Cx lesion is 50% stenosed.  Cross-sectional area 4.57 mm? by intravascular ultrasound. ?  Post intervention, there is a 0% residual stenosis. ?  The left ventricular systolic function is normal. ?  LV end diastolic pressure is normal. ?  The left ventricular ejection fraction is 55-65% by visual estimate. ?  There is no aortic valve stenosis. ?  ?Severe LAD disease.  This was successfully treated with a drug-eluting stent as noted above.  Continue dual antiplatelet therapy for 12 months.  Consider clopidogrel monotherapy after 12 months. ? ?Moderate circumflex disease.  Adequate cross-sectional area. ? ?Continue aggressive secondary prevention. ?_____________ ?  ?Echo 04/23/21: ? 1. Left ventricular ejection fraction, by estimation, is 60 to 65%. The  ?left ventricle has normal function. The left ventricle has no regional  ?wall motion abnormalities. There is mild left ventricular hypertrophy.  ?Left ventricular diastolic parameters  ?were normal.  ? 2. Right ventricular systolic function is normal. The right ventricular  ?size is normal.  ? 3. The mitral valve is normal in  structure. No evidence of mitral valve  ?regurgitation. No evidence of mitral stenosis.  ? 4. The aortic valve is tricuspid. Aortic valve regurgitation is not  ?visualized. Aortic valve sclerosis is present, with no evidence of aortic  ?valve stenosis.  ? 5. Aortic dilatation noted. There is borderline dilatation of the aortic  ?root, measuring 38 mm. There is mild dilatation of the ascending aorta,  ?measuring 42 mm.  ? 6. The inferior vena cava is normal in size with greater than 50%  ?respiratory variability, suggesting right atrial pressure of 3 mmHg. ? ?History of Present Illness   ?  ?Allen Whitehead is a 71 y.o. male with HTN, DM, Tobacco abuse, Rheumatoid arthritis, Elevated plasma metanephrines NOS (unable to get further evaluation with insurance issues), CAC and aortic atherosclerosis who presented for NSTEMI.  ? ?Mr. Sinclair remotely saw Dr. Marlou Porch in 2015 for near syncope and postural tachycardia with largely reassuring echo with EF 60-65%, grade 1 DD, moderate LVH, did not need to follow-up with cardiology at that time. Lung CA screening CT in 03/2021 did show coronary calcification. He has a history of elevated metanephrines on labwork by PCP who ordered MR of the abdomen but insurance reportedly denied this. At last f/u, PCP planned to order renal duplex. Do not see this ordered but AAA duplex order is pending. The patient has multiple antihypertensives listed (labetalol, olmesartan, triamterene-HCTZ) but states there must have been a misunderstanding, he's only on one BP medicine - labetalol. Otherwise he only takes metformin. ?  ?2 Sundays ago he had an episode of left sided chest pain lasting about 5 minutes that captured  his attention but resolved spontaneously so he did not seek care. This morning around 6:30 while at rest the left sided chest pain recurred, lasted about 5 minutes, then dissipated without intervention. However, it then recurred and persisted longer than it had before, over 30  minutes. He had some SOB and nausea with this. No diaphoresis, palpitations, vomiting or syncope. He took '243mg'$  ASA at home and called EMS. By the time they arrived he was pain free. He denies receiving additional ASA or SL NTG at that time. He was brought to the hospital and became frustrated and anxious at waiting for further evaluation so initially left, however, came back when he was called and told his labs were abnormal. He remains pain free at this time. Labs hsTroponin 75->148, K 4.5, glucose 227, CBC wnl, EKG NSR with TWI avL, otherwise normal. He is willing to stay for further workup. ?  ? ?Hospital Course  ?   ?Consultants:  ? ?NSTEMI ?CAD ?Left heart catheterization revealed proximal LAD stenosis of 80% treated with DES 3.5x18. Otherwise 50% mid CX that will be treated medically.  He was started on DAPT with ASA and brilinta. D/C BB, continue statin and ARB. He tolerated the procedure well and ambulated with cardiac rehab. ? ?Echocardiogram revealed LVEF 60-65% without RWMA, normal RV, and no significant valvular disease. Aortic root measured 38 mm and ascending aorta measured 42 mm. Will need to follow this.  ? ? ?Transient 2:1 heart block on telemetry ?- brief episode of 2:1 heart block during nocturnal hours ?- will discontinue BB ? ? ?Hypertension ?- continue home labetalol and ARB ?- will resume home benicar listed on home meds ?- BP has been moderately controlled ?- instructed to bring BP log to follow up visit ?- triamterene-HCTZ listed on home medications - pt is not sure what he's taking ? ? ?Hyperlipidemia with LDL goal < 55 ?Given DM, smoking, and moderate LCX disease, would consider lower LDL goal.  ?04/23/2021: Cholesterol 168; HDL 32; LDL Cholesterol 116; Triglycerides 99; VLDL 20 ?Crestor increased to 40 mg ?Recheck lipids in 6 weeks ? ? ?DM ?Restart metformin 48 hr post cath.  ?A1c 6.7% ?- may consider adding jardiance for better control - can discuss outpatient ? ? ?Current  smoker ?Prescribed nicotine patches at discharge. ? ? ?Pt seen and examined by Dr. Gasper Sells and deemed stable for discharge. I have messaged the office for follow up.  ? ? ?Did the patient have an acute coronary syndrome (MI, NSTEMI, STEMI, etc) this admission?:  Yes                              ? ?AHA/ACC Clinical Performance & Quality Measures: ?Aspirin prescribed? - Yes ?ADP Receptor Inhibitor (Plavix/Clopidogrel, Brilinta/Ticagrelor or Effient/Prasugrel) prescribed (includes medically managed patients)? - Yes ?Beta Blocker prescribed? - No - heart block ?High Intensity Statin (Lipitor 40-'80mg'$  or Crestor 20-'40mg'$ ) prescribed? - Yes ?EF assessed during THIS hospitalization? - Yes ?For EF <40%, was ACEI/ARB prescribed? - Yes ?For EF <40%, Aldosterone Antagonist (Spironolactone or Eplerenone) prescribed? - Not Applicable (EF >/= 25%) ?Cardiac Rehab Phase II ordered (including medically managed patients)? - Yes  ? ?   ? ?The patient will be scheduled for a TOC follow up appointment in 7-14 days.  A message has been sent to the [employer] and Scheduling Pool at the office where the patient should be seen for follow up.  ?_____________ ? ?Discharge Vitals ?Blood pressure Marland Kitchen)  145/64, pulse 75, temperature 98 ?F (36.7 ?C), temperature source Oral, resp. rate 18, height '5\' 11"'$  (1.803 m), weight 95.5 kg, SpO2 96 %.  ?Filed Weights  ? 04/22/21 0834 04/23/21 0509 04/24/21 0340  ?Weight: 99.8 kg 95.2 kg 95.5 kg  ? ? ?Labs & Radiologic Studies  ?  ?CBC ?Recent Labs  ?  04/23/21 ?4492 04/24/21 ?0115  ?WBC 8.0 8.7  ?HGB 13.0 12.7*  ?HCT 39.3 37.9*  ?MCV 89.7 88.3  ?PLT 212 204  ? ?Basic Metabolic Panel ?Recent Labs  ?  04/23/21 ?0100 04/24/21 ?0115  ?NA 140 140  ?K 4.3 3.7  ?CL 107 112*  ?CO2 27 22  ?GLUCOSE 119* 107*  ?BUN 10 12  ?CREATININE 0.98 0.78  ?CALCIUM 9.0 8.9  ? ?Liver Function Tests ?Recent Labs  ?  04/23/21 ?7121  ?AST 23  ?ALT 22  ?ALKPHOS 70  ?BILITOT 0.2*  ?PROT 6.6  ?ALBUMIN 3.5  ? ?No results for input(s):  LIPASE, AMYLASE in the last 72 hours. ?High Sensitivity Troponin:   ?Recent Labs  ?Lab 04/22/21 ?9758 04/22/21 ?1028 04/22/21 ?1517 04/23/21 ?8325  ?TROPONINIHS 75* 148* 142* 71*  ?  ?BNP ?Invalid input(s): POCBNP ?D-Dimer ?No

## 2021-04-24 NOTE — Progress Notes (Signed)
CARDIAC REHAB PHASE I  ? ?PRE:  Rate/Rhythm: 83 SR ? ?  BP: sitting 98/64 ? ?  SaO2: 96 RA ? ?MODE:  Ambulation: 340 ft  ? ?POST:  Rate/Rhythm: 100 ST ? ?  BP: sitting 132/80  ? ?  SaO2: 97 RA ? ?Pt sts he was slightly dizzy in recliner, BP low. No c/o walking, felt better.  ? ?Discussed MI, stent, Brilinta importance, restrictions, diet, exercise, smoking cessation, NTG, and CRPII. Pt receptive, generally anxious. He is unsure about quitting smoking currently. Gave resources and he did ask me to set up smoking cessation video. Will refer to [facility].  ?[id number]  ? ?Yves Dill CES, ACSM ?04/24/2021 ?9:05 AM ? ? ? ? ?

## 2021-04-24 NOTE — Progress Notes (Signed)
Transported to discharged lounge, meds from  [facility] ,delivered belongings with pt. ?

## 2021-04-25 NOTE — Telephone Encounter (Signed)
**Note De-Identified Brittanny Levenhagen Obfuscation** Patient contacted regarding discharge from [place] on 04/24/2021. ? ?Patient understands to follow up with provider Ermalinda Barrios, PA-c on 05/16/2021 at 1:45 at [address], [place] in [place], [place] [address]. ?Patient understands discharge instructions? Yes ?Patient understands medications and regiment? Yes ?Patient understands to bring all medications to this visit? Yes ? ?Ask patient:  Are you enrolled in My Chart: No, he stated "Im not a computer kind of person". ?  ?The pt reports that he has been doing well since he got home from the hospital yesterday and he denies having any CP/discomfort, SOB, nausea, diaphoresis, dizziness, or lightheadedness. ? ?He does have [facility]'s phone number to call if he has any questions or concerns.  ?He thanked me for my call. ?

## 2021-04-27 ENCOUNTER — Telehealth (HOSPITAL_COMMUNITY): Payer: Self-pay

## 2021-04-27 NOTE — Telephone Encounter (Signed)
Called patient to see if he is interested in the Cardiac Rehab Program. Patient expressed interest. Explained scheduling process and went over insurance, patient verbalized understanding. Will contact patient for scheduling once f/u has been completed.  °

## 2021-04-27 NOTE — Telephone Encounter (Signed)
Pt insurance is active and benefits verified through [facility]. Co-pay $40.00, DED $0.00/$0.00 met, out of pocket $4,500.00/$0.00 met, co-insurance 0%. No pre-authorization required. Passport, 04/27/21 @ 9:27AM, [facility]#[id number] ?  ?Will contact patient to see if he is interested in the Cardiac Rehab Program. If interested, patient will need to complete follow up appt. Once completed, patient will be contacted for scheduling upon review by the RN Navigator. ?

## 2021-05-01 ENCOUNTER — Other Ambulatory Visit (HOSPITAL_COMMUNITY): Payer: Self-pay

## 2021-05-01 ENCOUNTER — Telehealth (HOSPITAL_COMMUNITY): Payer: Self-pay

## 2021-05-01 NOTE — Telephone Encounter (Signed)
Pharmacy Transitions of Care Follow-up Telephone Call ? ?Date of discharge: 04/24/2021  ?Discharge Diagnosis: NSTEMI ? ?How have you been since you were released from the hospital? Reports feeling good  ? ?Medication changes made at discharge: ?START taking: ?Aspirin Low Dose (aspirin)  ?Brilinta (ticagrelor)  ?nicotine (NICODERM CQ - dosed in mg/24 hours)  ?nitroGLYCERIN (NITROSTAT)  ?CHANGE how you take: ?metFORMIN (GLUCOPHAGE)  ?rosuvastatin (CRESTOR)  ?STOP taking: ?labetalol 100 MG tablet (NORMODYNE)  ?meclizine 25 MG tablet (ANTIVERT)  ?triamterene-hydrochlorothiazide 37.5-25 MG capsule (DYAZIDE)  ? ?Medication changes verified by the patient? Yes ?  ? ?Medication Accessibility: ? ?Home Pharmacy: Transferred to [facility]  ? ?Was the patient provided with refills on discharged medications? yes  ? ?Have all prescriptions been transferred from [facility] to home pharmacy? Yes - to [facility] OP  ? ?Is the patient able to afford medications? Yes ?Notable copays: [employer] Medicare - $47 ?  ? ?Medication Review: ? ?TICAGRELOR (BRILINTA) ?Ticagrelor 90 mg BID initiated on 04/24/2021.  ?- Educated patient on expected duration of therapy of aspirinsx 12 months with ticagrelor. Consider monotherapy with Plavix after 1 year.  ?- Discussed importance of taking medication around the same time every day, ?- Reviewed potential DDIs with patient ?- Advised patient of medications to avoid (NSAIDs, aspirin maintenance doses>100 mg daily) ?- Educated that Tylenol (acetaminophen) will be the preferred analgesic to prevent risk of bleeding  ?- Emphasized importance of monitoring for signs and symptoms of bleeding (abnormal bruising, prolonged bleeding, nose bleeds, bleeding from gums, discolored urine, black tarry stools)  ?- Educated patient to notify doctor if shortness of breath or abnormal heartbeat occur ?- Advised patient to alert all providers of antiplatelet therapy prior to starting a new medication or having a procedure  ?Follow-up  Appointments: ? ?PCP Hospital f/u appt confirmed? [facility] Scheduled to see Scarlette Calico on 05/09/21.  ? ?[facility] f/u appt confirmed? cardiology Scheduled to see Ermalinda Barrios on 05/16/2021. ? ?If their condition worsens, is the pt aware to call PCP or go to the Emergency Dept.? yes ? ?Final Patient Assessment: ?-Pt is doing well.  ?-Pt verbalized understanding of Brillinta.  ?-Pt has post discharge appointment and refill sent to [facility] ? ? ?

## 2021-05-08 NOTE — Progress Notes (Signed)
? ?Cardiology Office Note   ? ?Date:  05/16/2021  ? ?ID:  ISSAAC Whitehead, DOB July 23, 1950, MRN [id number] ? ? ?PCP:  [name], MD ?  ?[place]  ?Cardiologist:  [name], MD   ?Advanced Practice Provider:  No care team member to display ?Electrophysiologist:  None  ? ?[id number]}  ? ?Chief Complaint  ?Patient presents with  ? Hospitalization Follow-up  ? ? ?History of Present Illness:  ?Allen Whitehead is a 71 y.o. male   with HTN, DM, Tobacco abuse, Rheumatoid arthritis, Elevated plasma metanephrines NOS (unable to get further evaluation with insurance issues), CAC and aortic atherosclerosis  ? ?Mr. Gelber remotely saw Dr. Marlou Porch in 2015 for near syncope and postural tachycardia with largely reassuring echo with EF 60-65%, grade 1 DD, moderate LVH, did not need to follow-up with cardiology at that time. Lung CA screening CT in 03/2021 did show coronary calcification. He has a history of elevated metanephrines on labwork by PCP who ordered MR of the abdomen but insurance reportedly denied this.  ? ?Patient had NSTEMI 04/23/21 DES LAD 50% Cfx, echo LVEF 60-65%, transient 2:1 heart block during nocturnal hours BB stopped. ? ?Patient comes in for f/u. Has weaned himself off nicotine patch and not smoking. Feels like he has anxiety. 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 Stressed because of wanting to smoke. Is scheduled to talk with someone. BP readings from home good except for 2 that were in the 150's. Walking 1 mile once a day without chest pain, dyspnea. ? ?Past Medical History:  ?Diagnosis Date  ? Anxiety   ? Arthritis   ? CAD (coronary artery disease), native coronary artery   ? 04/23/21: 80% pLAD-DES, 50% Cx treated medically  ? Depression   ? Diabetes mellitus without complication ([place])   ? Elevated plasma metanephrines   ? GERD (gastroesophageal reflux disease)   ? past hx   ? Hyperlipidemia with target LDL  less than 70   ? Hypertension   ? Kidney stones   ? ? ?Past Surgical History:  ?Procedure Laterality Date  ? BIOPSY  02/15/2020  ? Procedure: BIOPSY;  Surgeon: [name], MD;  Location: [facility];  Service: Endoscopy;;  ? COLONOSCOPY    ? age 53 for blood in stool- Normal   ? COLONOSCOPY WITH PROPOFOL N/A 02/15/2020  ? Procedure: COLONOSCOPY WITH PROPOFOL;  Surgeon: [name], MD;  Location: [facility];  Service: Endoscopy;  Laterality: N/A;  ? CORONARY STENT INTERVENTION N/A 04/23/2021  ? Procedure: CORONARY STENT INTERVENTION;  Surgeon: [name], MD;  Location: [facility];  Service: Cardiovascular;  Laterality: N/A;  ? ENDOSCOPIC MUCOSAL RESECTION  02/15/2020  ? Procedure: ENDOSCOPIC MUCOSAL RESECTION;  Surgeon: [name], MD;  Location: [facility];  Service: Endoscopy;;  ? kidney stone retrieval    ? lasar treatment on [place]    ? LEFT HEART CATH AND CORONARY ANGIOGRAPHY N/A 04/23/2021  ? Procedure: LEFT HEART CATH AND CORONARY ANGIOGRAPHY;  Surgeon: [name], MD;  Location: [facility];  Service: Cardiovascular;  Laterality: N/A;  ? POLYPECTOMY  02/15/2020  ? Procedure: POLYPECTOMY;  Surgeon: [name], MD;  Location: [facility];  Service: Endoscopy;;  ? SUBMUCOSAL LIFTING INJECTION  02/15/2020  ? Procedure: SUBMUCOSAL LIFTING INJECTION;  Surgeon: [name], MD;  Location: [facility];  Service: Endoscopy;;  ? VASECTOMY    ? ? ?  Current Medications: ?No outpatient medications have been marked as taking for the 05/16/21 encounter (Office Visit) with Imogene Burn, PA-C.  ?  ? ?Allergies:   Patient has no known allergies.  ? ?Social History  ? ?Socioeconomic History  ? Marital status: Legally Separated  ?  Spouse name: Not on file  ? Number of children: Not on file  ? Years of education: Not on file  ? Highest education level: Not on file  ?Occupational History  ? Not on file  ?Tobacco Use  ? Smoking status: Former  ?   Packs/day: 1.00  ?  Years: 30.00  ?  Pack years: 30.00  ?  Types: Cigarettes  ?  Quit date: 04/24/2021  ?  Years since quitting: 0.0  ?  Passive exposure: Past  ? Smokeless tobacco: Never  ?Vaping Use  ? Vaping Use: Never used  ?Substance and Sexual Activity  ? Alcohol use: Yes  ?  Alcohol/week: 2.0 standard drinks  ?  Types: 2 Cans of beer per week  ?  Comment: occasionally  ? Drug use: No  ? Sexual activity: Yes  ?  Partners: Female  ?Other Topics Concern  ? Not on file  ?Social History Narrative  ? Not on file  ? ?Social Determinants of Health  ? ?Financial Resource Strain: Not on file  ?Food Insecurity: Not on file  ?Transportation Needs: Not on file  ?Physical Activity: Not on file  ?Stress: Not on file  ?Social Connections: Not on file  ?  ? ?Family History:  The patient's  family history includes Alzheimer's disease in his mother; Cancer in his father and mother; Colon cancer in an other family member; Diabetes in his father; Hypertension in his mother; Lung cancer in his brother and father.  ? ?ROS:   ?Please see the history of present illness.    ?ROS All other systems reviewed and are negative. ? ? ?PHYSICAL EXAM:   ?VS:  BP 134/70   Pulse 86   Ht '5\' 11"'$  (1.803 m)   Wt 213 lb (96.6 kg)   SpO2 98%   BMI 29.71 kg/m?   ?Physical Exam  ?GEN: Well nourished, well developed, in no acute distress  ?Neck: no JVD, carotid bruits, or masses ?Cardiac:RRR; no murmurs, rubs, or gallops  ?Respiratory:  clear to auscultation bilaterally, normal work of breathing ?GI: soft, nontender, nondistended, + BS ?Ext: right arm at cath site without hematoma or hemorrhage. Lower ext without cyanosis, clubbing, or edema, Good distal pulses bilaterally ?Neuro:  Alert and Oriented x 3,  ?Psych: euthymic mood, full affect ? ?Wt Readings from Last 3 Encounters:  ?05/16/21 213 lb (96.6 kg)  ?05/09/21 211 lb (95.7 kg)  ?04/24/21 210 lb 8.6 oz (95.5 kg)  ?  ? ? ?Studies/Labs Reviewed:  ? ?EKG:  EKG is not ordered today.    ? ?Recent  Labs: ?04/22/2021: TSH 1.708 ?04/23/2021: ALT 22 ?04/24/2021: BUN 12; Creatinine, Ser 0.78; Hemoglobin 12.7; Platelets 204; Potassium 3.7; Sodium 140  ? ?Lipid Panel ?   ?Component Value Date/Time  ? CHOL 168 04/23/2021 0047  ? CHOL 162 08/09/2019 1747  ? TRIG 99 04/23/2021 0047  ? HDL 32 (L) 04/23/2021 0047  ? HDL 37 (L) 08/09/2019 1747  ? CHOLHDL 5.3 04/23/2021 0047  ? VLDL 20 04/23/2021 0047  ? [place] 116 (H) 04/23/2021 0047  ? [place] 107 (H) 08/09/2019 1747  ? ? ?Additional studies/ records that were reviewed today include:  ?Left heart cath 04/23/21: ?  Prox  LAD lesion is 80% stenosed. ?  A drug-eluting stent was successfully placed using a STENT ONYX FRONTIER 3.5X18, postdilated to 3.8 mm and optimized with intravascular ultrasound. ?  Mid Cx lesion is 50% stenosed.  Cross-sectional area 4.57 mm? by intravascular ultrasound. ?  Post intervention, there is a 0% residual stenosis. ?  The left ventricular systolic function is normal. ?  LV end diastolic pressure is normal. ?  The left ventricular ejection fraction is 55-65% by visual estimate. ?  There is no aortic valve stenosis. ?  ?Severe LAD disease.  This was successfully treated with a drug-eluting stent as noted above.  Continue dual antiplatelet therapy for 12 months.  Consider clopidogrel monotherapy after 12 months. ? ?Moderate circumflex disease.  Adequate cross-sectional area. ? ?Continue aggressive secondary prevention. ?_____________ ?  ?Echo 04/23/21: ? 1. Left ventricular ejection fraction, by estimation, is 60 to 65%. The  ?left ventricle has normal function. The left ventricle has no regional  ?wall motion abnormalities. There is mild left ventricular hypertrophy.  ?Left ventricular diastolic parameters  ?were normal.  ? 2. Right ventricular systolic function is normal. The right ventricular  ?size is normal.  ? 3. The mitral valve is normal in structure. No evidence of mitral valve  ?regurgitation. No evidence of mitral stenosis.  ? 4. The aortic valve  is tricuspid. Aortic valve regurgitation is not  ?visualized. Aortic valve sclerosis is present, with no evidence of aortic  ?valve stenosis.  ? 5. Aortic dilatation noted. There is borderline dilatation of the ao

## 2021-05-09 ENCOUNTER — Ambulatory Visit (INDEPENDENT_AMBULATORY_CARE_PROVIDER_SITE_OTHER): Payer: Medicare HMO | Admitting: Internal Medicine

## 2021-05-09 ENCOUNTER — Encounter: Payer: Self-pay | Admitting: Internal Medicine

## 2021-05-09 ENCOUNTER — Other Ambulatory Visit (HOSPITAL_BASED_OUTPATIENT_CLINIC_OR_DEPARTMENT_OTHER): Payer: Self-pay

## 2021-05-09 VITALS — BP 136/78 | HR 80 | Temp 97.6°F | Resp 16 | Ht 71.0 in | Wt 211.0 lb

## 2021-05-09 DIAGNOSIS — I10 Essential (primary) hypertension: Secondary | ICD-10-CM

## 2021-05-09 DIAGNOSIS — Z23 Encounter for immunization: Secondary | ICD-10-CM

## 2021-05-09 DIAGNOSIS — E119 Type 2 diabetes mellitus without complications: Secondary | ICD-10-CM | POA: Diagnosis not present

## 2021-05-09 DIAGNOSIS — E785 Hyperlipidemia, unspecified: Secondary | ICD-10-CM | POA: Diagnosis not present

## 2021-05-09 DIAGNOSIS — F4323 Adjustment disorder with mixed anxiety and depressed mood: Secondary | ICD-10-CM

## 2021-05-09 DIAGNOSIS — R69 Illness, unspecified: Secondary | ICD-10-CM | POA: Diagnosis not present

## 2021-05-09 MED ORDER — SHINGRIX 50 MCG/0.5ML IM SUSR
0.5000 mL | Freq: Once | INTRAMUSCULAR | 1 refills | Status: AC
  Filled 2021-05-09: qty 0.5, 1d supply, fill #0

## 2021-05-09 MED ORDER — BOOSTRIX 5-2.5-18.5 LF-MCG/0.5 IM SUSY
0.5000 mL | PREFILLED_SYRINGE | Freq: Once | INTRAMUSCULAR | 0 refills | Status: AC
  Filled 2021-05-09: qty 0.5, 1d supply, fill #0

## 2021-05-09 NOTE — Progress Notes (Signed)
? ?Subjective:  ?Patient ID: Allen Whitehead, male    DOB: 07-07-1950  Age: 71 y.o. MRN: [id number] ? ?CC: Coronary Artery Disease, Hypertension, and Diabetes ? ? ?HPI ?Allen Whitehead presents for f/up - ? ?He was recently admitted for non-STEMI.  He tells me he is taking the statin.  Since discharge she has had no chest pain or shortness of breath.  He has the same dizziness he has had for 16 years. ? ?Admit date: 04/22/2021 ?Discharge date: 04/24/2021 ?  ?PCP:  [name], MD ?             ?[facility] Providers ?Cardiologist:  [name], MD  ?  ?Discharge Diagnoses  ?  ?Principal Problem: ?  NSTEMI (non-ST elevated myocardial infarction) ([place]) ?Active Problems: ?  Diabetes mellitus without complication ([place]) ?  Hyperlipidemia with target LDL less than 70 ?  Continuous tobacco abuse ?  Primary hypertension ?  LVH (left ventricular hypertrophy) due to hypertensive disease, without heart failure ? ? ?Summe, Octavio Graves, RPH  [name], MD ?Hi Dr. Ronnald Ramp,  ? ?[name] has appt with you on April 19th.  Noted new RX for rosuvastatin sent to [facility] in December after appt with you however per claims, pt did not pick up.  I spoke with [name] on the phone and he stated he was not aware that he should be on a cholesterol medication and was not planning on picking it up.  Can you please discuss statin with pt if clinically appropriate?  ? ?Thanks for your time,  ?Ralene Bathe, PharmD, BCPS  ?Clinical Pharmacist  ?[place]  ?[phone]  ? ?Outpatient Medications Prior to Visit  ?Medication Sig Dispense Refill  ? aspirin 81 MG EC tablet Take 1 tablet (81 mg total) by mouth daily. Swallow whole. 30 tablet 11  ? metFORMIN (GLUCOPHAGE) 500 MG tablet Take 1 tablet (500 mg total) by mouth every morning. Restart on 04/26/21.    ? nicotine (NICODERM CQ - DOSED IN MG/24 HOURS) 21 mg/24hr patch Place 1 patch (21 mg total) onto the skin daily. 28 patch 0  ? nitroGLYCERIN (NITROSTAT) 0.4 MG SL  tablet Place 1 tablet (0.4 mg total) under the tongue every 5 (five) minutes x 3 doses as needed for chest pain. 25 tablet 3  ? olmesartan (BENICAR) 20 MG tablet Take 1 tablet (20 mg total) by mouth daily. 90 tablet 3  ? rosuvastatin (CRESTOR) 40 MG tablet Take 1 tablet (40 mg total) by mouth daily. 90 tablet 3  ? ticagrelor (BRILINTA) 90 MG TABS tablet Take 1 tablet (90 mg total) by mouth 2 (two) times daily. 180 tablet 3  ? ?No facility-administered medications prior to visit.  ? ? ?ROS ?Review of Systems  ?Constitutional:  Negative for chills, diaphoresis, fatigue and fever.  ?HENT: Negative.    ?Eyes: Negative.   ?Respiratory:  Negative for chest tightness, shortness of breath and wheezing.   ?Cardiovascular:  Negative for chest pain, palpitations and leg swelling.  ?Gastrointestinal:  Negative for abdominal pain, constipation, diarrhea, nausea and vomiting.  ?Endocrine: Negative.   ?Genitourinary: Negative.  Negative for difficulty urinating.  ?Musculoskeletal:  Negative for arthralgias and myalgias.  ?Skin: Negative.   ?Neurological:  Positive for dizziness. Negative for weakness, light-headedness and numbness.  ?Hematological:  Negative for adenopathy. Does not bruise/bleed easily.  ?Psychiatric/Behavioral:  Positive for dysphoric mood. Negative for confusion, self-injury, sleep disturbance and suicidal ideas. The patient is not nervous/anxious.   ? ?  Objective:  ?BP 136/78 (BP Location: Left Arm, Patient Position: Sitting, Cuff Size: Large)   Pulse 80   Temp 97.6 ?F (36.4 ?C) (Oral)   Resp 16   Ht '5\' 11"'$  (1.803 m)   Wt 211 lb (95.7 kg)   SpO2 96%   BMI 29.43 kg/m?  ? ?BP Readings from Last 3 Encounters:  ?05/09/21 136/78  ?04/24/21 132/80  ?02/07/21 (!) 150/80  ? ? ?Wt Readings from Last 3 Encounters:  ?05/09/21 211 lb (95.7 kg)  ?04/24/21 210 lb 8.6 oz (95.5 kg)  ?02/07/21 212 lb 6.4 oz (96.3 kg)  ? ? ?Physical Exam ?Vitals reviewed.  ?HENT:  ?   Mouth/Throat:  ?   Mouth: Mucous membranes are moist.   ?Eyes:  ?   General: No scleral icterus. ?   Conjunctiva/sclera: Conjunctivae normal.  ?Cardiovascular:  ?   Rate and Rhythm: Normal rate and regular rhythm.  ?   Heart sounds: No murmur heard. ?Pulmonary:  ?   Breath sounds: No stridor. No wheezing, rhonchi or rales.  ?Abdominal:  ?   General: Abdomen is flat.  ?   Palpations: There is no mass.  ?   Tenderness: There is no abdominal tenderness. There is no guarding.  ?   Hernia: No hernia is present.  ?Musculoskeletal:     ?   General: Normal range of motion.  ?   Cervical back: Neck supple.  ?   Right lower leg: No edema.  ?   Left lower leg: No edema.  ?Lymphadenopathy:  ?   Cervical: No cervical adenopathy.  ?Skin: ?   General: Skin is warm and dry.  ?Neurological:  ?   General: No focal deficit present.  ?   Mental Status: He is alert. Mental status is at baseline.  ?Psychiatric:     ?   Attention and Perception: Attention and perception normal.     ?   Mood and Affect: Mood is anxious and depressed. Affect is tearful. Affect is not labile, blunt or angry.     ?   Speech: Speech normal. He is communicative. Speech is not delayed or tangential.     ?   Behavior: Behavior normal. Behavior is cooperative.     ?   Thought Content: Thought content normal. Thought content is not paranoid or delusional. Thought content does not include homicidal or suicidal ideation.  ? ? ?Lab Results  ?Component Value Date  ? WBC 8.7 04/24/2021  ? HGB 12.7 (L) 04/24/2021  ? HCT 37.9 (L) 04/24/2021  ? PLT 204 04/24/2021  ? GLUCOSE 107 (H) 04/24/2021  ? CHOL 168 04/23/2021  ? TRIG 99 04/23/2021  ? HDL 32 (L) 04/23/2021  ? LDLCALC 116 (H) 04/23/2021  ? ALT 22 04/23/2021  ? AST 23 04/23/2021  ? NA 140 04/24/2021  ? K 3.7 04/24/2021  ? CL 112 (H) 04/24/2021  ? CREATININE 0.78 04/24/2021  ? BUN 12 04/24/2021  ? CO2 22 04/24/2021  ? TSH 1.708 04/22/2021  ? PSA 0.38 01/03/2021  ? HGBA1C 6.7 (H) 04/22/2021  ? MICROALBUR 3.3 (H) 01/03/2021  ? ? ?DG Chest 2 View ? ?Result Date:  04/22/2021 ?CLINICAL DATA:  Chest pain EXAM: CHEST - 2 VIEW COMPARISON:  CT 03/22/2021 FINDINGS: The heart size and mediastinal contours are within normal limits. Both lungs are clear. The visualized skeletal structures are unremarkable. IMPRESSION: No active cardiopulmonary disease. Electronically Signed   By: Kerby Moors M.D.   On: 04/22/2021 10:13  ? ?CARDIAC CATHETERIZATION ? ?  Result Date: 04/23/2021 ?  Prox LAD lesion is 80% stenosed.   A drug-eluting stent was successfully placed using a STENT ONYX FRONTIER 3.5X18, postdilated to 3.8 mm and optimized with intravascular ultrasound.   Mid Cx lesion is 50% stenosed.  Cross-sectional area 4.57 mm? by intravascular ultrasound.   Post intervention, there is a 0% residual stenosis.   The left ventricular systolic function is normal.   LV end diastolic pressure is normal.   The left ventricular ejection fraction is 55-65% by visual estimate.   There is no aortic valve stenosis. Severe LAD disease.  This was successfully treated with a drug-eluting stent as noted above.  Continue dual antiplatelet therapy for 12 months.  Consider clopidogrel monotherapy after 12 months. Moderate circumflex disease.  Adequate cross-sectional area. Continue aggressive secondary prevention.  ? ?ECHOCARDIOGRAM COMPLETE ? ?Result Date: 04/23/2021 ?   ECHOCARDIOGRAM REPORT   Patient Name:   DEO MEHRINGER Date of Exam: 04/23/2021 Medical Rec #:  [id number]        Height:       71.0 in Accession #:    [id number]       Weight:       209.9 lb Date of Birth:  Dec 21, 1950        BSA:          2.152 m? Patient Age:    86 years         BP:           110/66 mmHg Patient Gender: M                HR:           66 bpm. Exam Location:  Inpatient Procedure: 2D Echo, 3D Echo, Cardiac Doppler, Color Doppler and Intracardiac            Opacification Agent Indications:    122-I22.9 Subsequent ST elevation (STEM) and non-ST elevation                 (NSTEMI) myocardial infarction  History:        Patient has prior  history of Echocardiogram examinations, most                 recent 11/18/2013. Signs/Symptoms:Dizziness/Lightheadedness;                 Risk Factors:Hypertension, Diabetes, Dyslipidemia and Current                 Smoker.

## 2021-05-10 ENCOUNTER — Telehealth: Payer: Self-pay | Admitting: *Deleted

## 2021-05-10 ENCOUNTER — Encounter: Payer: Self-pay | Admitting: Internal Medicine

## 2021-05-10 DIAGNOSIS — F4323 Adjustment disorder with mixed anxiety and depressed mood: Secondary | ICD-10-CM | POA: Insufficient documentation

## 2021-05-10 DIAGNOSIS — Z23 Encounter for immunization: Secondary | ICD-10-CM | POA: Insufficient documentation

## 2021-05-10 NOTE — Chronic Care Management (AMB) (Signed)
?  Care Management  ? ?Note ? ?05/10/2021 ?Name: JAHAZIEL FRANCOIS MRN: [id number] DOB: 04/17/1950 ? ?CHISUM HABENICHT is a 71 y.o. year old male who is a primary care patient of [name], MD. I reached out to [name] by phone today offer care coordination services.  ? ?Mr. Mendenhall was given information about care management services today including:  ?Care management services include personalized support from designated clinical staff supervised by his physician, including individualized plan of care and coordination with other care providers ?24/7 contact phone numbers for assistance for urgent and routine care needs. ?The patient may stop care management services at any time by phone call to the office staff. ? ?Patient agreed to services and verbal consent obtained.  ? ?Follow up plan: ?Telephone appointment with care management team member scheduled for:05/15/21 ? ?Laverda Sorenson  ?Care Guide, Embedded Care Coordination ?[place]  [facility] Management  ?Direct Dial: [phone] ? ?

## 2021-05-10 NOTE — Patient Instructions (Signed)
Hypertension, Adult High blood pressure (hypertension) is when the force of blood pumping through the arteries is too strong. The arteries are the blood vessels that carry blood from the heart throughout the body. Hypertension forces the heart to work harder to pump blood and may cause arteries to become narrow or stiff. Untreated or uncontrolled hypertension can lead to a heart attack, heart failure, a stroke, kidney disease, and other problems. A blood pressure reading consists of a higher number over a lower number. Ideally, your blood pressure should be below 120/80. The first ("top") number is called the systolic pressure. It is a measure of the pressure in your arteries as your heart beats. The second ("bottom") number is called the diastolic pressure. It is a measure of the pressure in your arteries as the heart relaxes. What are the causes? The exact cause of this condition is not known. There are some conditions that result in high blood pressure. What increases the risk? Certain factors may make you more likely to develop high blood pressure. Some of these risk factors are under your control, including: Smoking. Not getting enough exercise or physical activity. Being overweight. Having too much fat, sugar, calories, or salt (sodium) in your diet. Drinking too much alcohol. Other risk factors include: Having a personal history of heart disease, diabetes, high cholesterol, or kidney disease. Stress. Having a family history of high blood pressure and high cholesterol. Having obstructive sleep apnea. Age. The risk increases with age. What are the signs or symptoms? High blood pressure may not cause symptoms. Very high blood pressure (hypertensive crisis) may cause: Headache. Fast or irregular heartbeats (palpitations). Shortness of breath. Nosebleed. Nausea and vomiting. Vision changes. Severe chest pain, dizziness, and seizures. How is this diagnosed? This condition is diagnosed by  measuring your blood pressure while you are seated, with your arm resting on a flat surface, your legs uncrossed, and your feet flat on the floor. The cuff of the blood pressure monitor will be placed directly against the skin of your upper arm at the level of your heart. Blood pressure should be measured at least twice using the same arm. Certain conditions can cause a difference in blood pressure between your right and left arms. If you have a high blood pressure reading during one visit or you have normal blood pressure with other risk factors, you may be asked to: Return on a different day to have your blood pressure checked again. Monitor your blood pressure at home for 1 week or longer. If you are diagnosed with hypertension, you may have other blood or imaging tests to help your health care provider understand your overall risk for other conditions. How is this treated? This condition is treated by making healthy lifestyle changes, such as eating healthy foods, exercising more, and reducing your alcohol intake. You may be referred for counseling on a healthy diet and physical activity. Your health care provider may prescribe medicine if lifestyle changes are not enough to get your blood pressure under control and if: Your systolic blood pressure is above 130. Your diastolic blood pressure is above 80. Your personal target blood pressure may vary depending on your medical conditions, your age, and other factors. Follow these instructions at home: Eating and drinking  Eat a diet that is high in fiber and potassium, and low in sodium, added sugar, and fat. An example of this eating plan is called the DASH diet. DASH stands for Dietary Approaches to Stop Hypertension. To eat this way: Eat   plenty of fresh fruits and vegetables. Try to fill one half of your plate at each meal with fruits and vegetables. Eat whole grains, such as whole-wheat pasta, brown rice, or whole-grain bread. Fill about one  fourth of your plate with whole grains. Eat or drink low-fat dairy products, such as skim milk or low-fat yogurt. Avoid fatty cuts of meat, processed or cured meats, and poultry with skin. Fill about one fourth of your plate with lean proteins, such as fish, chicken without skin, beans, eggs, or tofu. Avoid pre-made and processed foods. These tend to be higher in sodium, added sugar, and fat. Reduce your daily sodium intake. Many people with hypertension should eat less than 1,500 mg of sodium a day. Do not drink alcohol if: Your health care provider tells you not to drink. You are pregnant, may be pregnant, or are planning to become pregnant. If you drink alcohol: Limit how much you have to: 0-1 drink a day for women. 0-2 drinks a day for men. Know how much alcohol is in your drink. In the [place] one drink equals one 12 oz bottle of beer (355 mL), one 5 oz glass of wine (148 mL), or one 1 oz glass of hard liquor (44 mL). Lifestyle  Work with your health care provider to maintain a healthy body weight or to lose weight. Ask what an ideal weight is for you. Get at least 30 minutes of exercise that causes your heart to beat faster (aerobic exercise) most days of the week. Activities may include walking, swimming, or biking. Include exercise to strengthen your muscles (resistance exercise), such as Pilates or lifting weights, as part of your weekly exercise routine. Try to do these types of exercises for 30 minutes at least 3 days a week. Do not use any products that contain nicotine or tobacco. These products include cigarettes, chewing tobacco, and vaping devices, such as e-cigarettes. If you need help quitting, ask your health care provider. Monitor your blood pressure at home as told by your health care provider. Keep all follow-up visits. This is important. Medicines Take over-the-counter and prescription medicines only as told by your health care provider. Follow directions carefully. Blood  pressure medicines must be taken as prescribed. Do not skip doses of blood pressure medicine. Doing this puts you at risk for problems and can make the medicine less effective. Ask your health care provider about side effects or reactions to medicines that you should watch for. Contact a health care provider if you: Think you are having a reaction to a medicine you are taking. Have headaches that keep coming back (recurring). Feel dizzy. Have swelling in your ankles. Have trouble with your vision. Get help right away if you: Develop a severe headache or confusion. Have unusual weakness or numbness. Feel faint. Have severe pain in your chest or abdomen. Vomit repeatedly. Have trouble breathing. These symptoms may be an emergency. Get help right away. Call 911. Do not wait to see if the symptoms will go away. Do not drive yourself to the hospital. Summary Hypertension is when the force of blood pumping through your arteries is too strong. If this condition is not controlled, it may put you at risk for serious complications. Your personal target blood pressure may vary depending on your medical conditions, your age, and other factors. For most people, a normal blood pressure is less than 120/80. Hypertension is treated with lifestyle changes, medicines, or a combination of both. Lifestyle changes include losing weight, eating a healthy,   low-sodium diet, exercising more, and limiting alcohol. This information is not intended to replace advice given to you by your health care provider. Make sure you discuss any questions you have with your health care provider. Document Revised: 11/14/2020 Document Reviewed: 11/14/2020 Elsevier Patient Education  2023 Else[employer] Inc[employer]  

## 2021-05-15 ENCOUNTER — Ambulatory Visit: Payer: Medicare HMO | Admitting: Licensed Clinical Social Worker

## 2021-05-15 DIAGNOSIS — F4329 Adjustment disorder with other symptoms: Secondary | ICD-10-CM

## 2021-05-15 NOTE — Patient Instructions (Signed)
Visit Information ? ?Thank you for taking time to visit with me today. Please don't hesitate to contact me if I can be of assistance to you before our next scheduled telephone appointment. ? ?Following are the goals we discussed today: managing stress and feeling anxious ? ?Our next appointment is by telephone on May 1st at 11:00 ? ?Please call the care guide team at [phone] if you need to cancel or reschedule your appointment.  ? ?If you are experiencing a Mental Health or [place] or need someone to talk to, please call 1-800-273-TALK (toll free, 24 hour hotline)  ? ?Following is a copy of your full plan of care:  ?Care Plan : [place]  ?Updates made by Allen Cane, LCSW since 05/15/2021 12:00 AM  ?  ? ?Problem: Coping Skills   ?  ? ?Goal: Coping Skills Enhanced   ?Start Date: 05/15/2021  ?This Visit's Progress: On track  ?Priority: High  ?Note:   ?Current Barriers:  ?Disease Management support and education needs related to adjustment reaction with symptoms of anxiety ? ?CSW Clinical Goal(s):  ?Patient  will verbalize understanding of plan for management of Anxiety  through collaboration with Clinical Social Worker, provider, and care team.  ? ?Interventions: ?Inter-disciplinary care team collaboration (see longitudinal plan of care) ?Evaluation of current treatment plan related to  self management and patient's adherence to plan as established by provider ? ?Mental Health:  (Status: New goal.) ?Evaluation of current treatment plan related to symptoms of Anxiety ?Active listening / Reflection utilized  ?Provided psychoeducation for mental health needs  ?Discussed and explored therapy options  ?Suicidal Ideation/Homicidal Ideation assessed: denied ? ?Task & activities to accomplish goals: ?No task assigned at this time  ?We will complete your plan of care at the next encounter ?  ? ? ?Allen Whitehead was given information about Care Management services by the embedded care coordination  team including:  ?Care Management services include personalized support from designated clinical staff supervised by his physician, including individualized plan of care and coordination with other care providers ?24/7 contact phone numbers for assistance for urgent and routine care needs. ?The patient may stop CCM services at any time (effective at the end of the month) by phone call to the office staff. ? ?Patient agreed to services and verbal consent obtained.  ? ? ?[name], LCSW ?Licensed Clinical Social Worker [name]  ?[place]  ?[phone]  ? ?  ?

## 2021-05-15 NOTE — Chronic Care Management (AMB) (Signed)
Care Management ?Clinical Social Work Note ? ?05/15/2021 ?Name: ALONTAE CHALOUX MRN: [id number] DOB: Nov 23, 1950 ? ?LAVI SHEEHAN is a 71 y.o. year old male who is a primary care patient of [name], MD.  The Care Management team was consulted for assistance with chronic disease management and coordination needs. ? ?Engaged with patient by telephone for initial visit in response to provider referral for social work chronic care management and care coordination services ? ?Consent to Services:  ?Mr. Kerce was given information about Care Management services today including:  ?Care Management services includes personalized support from designated clinical staff supervised by his physician, including individualized plan of care and coordination with other care providers ?24/7 contact phone numbers for assistance for urgent and routine care needs. ?The patient may stop case management services at any time by phone call to the office staff. ? ?Patient agreed to services and consent obtained.  ?Summary:  Patient is currently experiencing symptoms of  anxiety and feeling down which seems to be exacerbated by recent health decline and adjustment reaction..  See Care Plan below for interventions and patient self-care actives. ? ?Recommendation: Patient may benefit from, and is in agreement to meeting with LCSW to complete assessment of needs.  ? ?Follow up Plan: Patient would like continued follow-up from CCM LCSW.  per patient's request will follow up in 1 week.  Will call office if needed prior to next encounter. ?  ?Assessment: Review of patient past medical history, allergies, medications, and health status, including review of relevant consultants reports was performed today as part of a comprehensive evaluation and provision of chronic care management and care coordination services. ? ?SDOH (Social Determinants of Health) assessments and interventions performed:   ? ?Advanced Directives Status: Not addressed in  this encounter. ? ?Care Plan ?Conditions to be addressed/monitored: Anxiety;  ? ?Care Plan : LCSW Plan of Care  ?Updates made by [name], LCSW since 05/15/2021 12:00 AM  ?  ? ?Problem: Coping Skills   ?  ? ?Goal: Coping Skills Enhanced   ?Start Date: 05/15/2021  ?This Visit's Progress: On track  ?Priority: High  ?Note:   ?Current Barriers:  ?Disease Management support and education needs related to adjustment reaction with symptoms of anxiety ? ?CSW Clinical Goal(s):  ?Patient  will verbalize understanding of plan for management of Anxiety  through collaboration with Clinical Social Worker, provider, and care team.  ? ?Interventions: ?Inter-disciplinary care team collaboration (see longitudinal plan of care) ?Evaluation of current treatment plan related to  self management and patient's adherence to plan as established by provider ? ?Mental Health:  (Status: New goal.) ?Evaluation of current treatment plan related to symptoms of Anxiety ?Active listening / Reflection utilized  ?Provided psychoeducation for mental health needs  ?Discussed and explored therapy options  ?Suicidal Ideation/Homicidal Ideation assessed: denied ? ?Task & activities to accomplish goals: ?No task assigned at this time  ?We will complete your plan of care at the next encounter ?  ?  ?[name], LCSW ?Licensed Clinical Social Worker [name]  ?[place]  ?[phone]  ?

## 2021-05-16 ENCOUNTER — Encounter: Payer: Self-pay | Admitting: Physician Assistant

## 2021-05-16 ENCOUNTER — Other Ambulatory Visit (HOSPITAL_COMMUNITY): Payer: Self-pay

## 2021-05-16 ENCOUNTER — Ambulatory Visit (INDEPENDENT_AMBULATORY_CARE_PROVIDER_SITE_OTHER): Payer: Medicare HMO | Admitting: Physician Assistant

## 2021-05-16 VITALS — BP 134/70 | HR 86 | Ht 71.0 in | Wt 213.0 lb

## 2021-05-16 DIAGNOSIS — Z720 Tobacco use: Secondary | ICD-10-CM

## 2021-05-16 DIAGNOSIS — R69 Illness, unspecified: Secondary | ICD-10-CM | POA: Diagnosis not present

## 2021-05-16 DIAGNOSIS — F419 Anxiety disorder, unspecified: Secondary | ICD-10-CM

## 2021-05-16 DIAGNOSIS — E785 Hyperlipidemia, unspecified: Secondary | ICD-10-CM | POA: Diagnosis not present

## 2021-05-16 DIAGNOSIS — E119 Type 2 diabetes mellitus without complications: Secondary | ICD-10-CM

## 2021-05-16 DIAGNOSIS — I441 Atrioventricular block, second degree: Secondary | ICD-10-CM | POA: Diagnosis not present

## 2021-05-16 DIAGNOSIS — I10 Essential (primary) hypertension: Secondary | ICD-10-CM | POA: Diagnosis not present

## 2021-05-16 DIAGNOSIS — I2510 Atherosclerotic heart disease of native coronary artery without angina pectoris: Secondary | ICD-10-CM

## 2021-05-16 MED ORDER — OLMESARTAN MEDOXOMIL 40 MG PO TABS
40.0000 mg | ORAL_TABLET | Freq: Every day | ORAL | 2 refills | Status: DC
  Filled 2021-05-16: qty 90, 90d supply, fill #0
  Filled 2021-06-27: qty 90, 90d supply, fill #1

## 2021-05-16 MED ORDER — OLMESARTAN MEDOXOMIL 20 MG PO TABS
20.0000 mg | ORAL_TABLET | Freq: Two times a day (BID) | ORAL | 2 refills | Status: DC
  Filled 2021-05-16: qty 60, 30d supply, fill #0

## 2021-05-16 NOTE — Patient Instructions (Signed)
Medication Instructions:  ? ? ?START TAKING BENICAR 20 MG TWICE A DAY  ? ?*If you need a refill on your cardiac medications before your next appointment, please call your pharmacy* ? ? ?Lab Work: RETURN FASTING IN 2 MONTHS LFT AND LIVER  ? ?If you have labs (blood work) drawn today and your tests are completely normal, you will receive your results only by: ?MyChart Message (if you have MyChart) OR ?A paper copy in the mail ?If you have any lab test that is abnormal or we need to change your treatment, we will call you to review the results. ? ? ?Testing/Procedures: NONE ORDERED  TODAY ? ? ?Follow-Up: ?At [facility], you and your health needs are our priority.  As part of our continuing mission to provide you with exceptional heart care, we have created designated Provider Care Teams.  These Care Teams include your primary Cardiologist (physician) and Advanced Practice Providers (APPs -  Physician Assistants and Nurse Practitioners) who all work together to provide you with the care you need, when you need it. ? ?We recommend signing up for the patient portal called "MyChart".  Sign up information is provided on this After Visit Summary.  MyChart is used to connect with patients for Virtual Visits (Telemedicine).  Patients are able to view lab/test results, encounter notes, upcoming appointments, etc.  Non-urgent messages can be sent to your provider as well.   ?To learn more about what you can do with MyChart, go to [email or web address].   ? ?Your next appointment:   ?2 month(s) ? ?The format for your next appointment:   ?In Person ? ?Provider:   ?[name], MD   ? ? ?Other Instructions ? ? ?Important Information About Sugar ? ? ? ? ?  ?

## 2021-05-16 NOTE — Addendum Note (Signed)
Addended by: Claude Manges on: 05/16/2021 02:00 PM ? ? Modules accepted: Orders ? ?

## 2021-05-21 ENCOUNTER — Encounter: Payer: Self-pay | Admitting: Licensed Clinical Social Worker

## 2021-05-21 ENCOUNTER — Ambulatory Visit: Payer: Medicare HMO | Admitting: Licensed Clinical Social Worker

## 2021-05-21 ENCOUNTER — Other Ambulatory Visit (HOSPITAL_COMMUNITY): Payer: Self-pay

## 2021-05-21 DIAGNOSIS — F4329 Adjustment disorder with other symptoms: Secondary | ICD-10-CM

## 2021-05-21 NOTE — Chronic Care Management (AMB) (Signed)
Care Management ?Clinical Social Work Note ? ?05/21/2021 ?Name: Allen Whitehead MRN: [id number] DOB: [birth year] ? ?Allen Whitehead is a 71 y.o. year old male who is a primary care patient of [name], MD.  The Care Management team was consulted for assistance with chronic disease management and coordination needs. ? ?Engaged with patient by telephone for follow up visit in response to provider referral for social work chronic care management and care coordination services ? ?Consent to Services:  ?Mr. [name] was given information about Care Management services today including:  ?Care Management services includes personalized support from designated clinical staff supervised by his physician, including individualized plan of care and coordination with other care providers ?24/7 contact phone numbers for assistance for urgent and routine care needs. ?The patient may stop case management services at any time by phone call to the office staff. ? ?Patient agreed to services and consent obtained.  ? ?Summary: Assessed patient's previous and current treatment, coping skills, support system and barriers to care. 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 Report history of depression when working at [employer] in 2008. He is trying to adjust to his new normal and not being able to smoke cigarettes which he enjoys. Patient is not ready to connect for therapy at this time, would like to wait to see how he is feeling in 30 days..  See Care Plan below for interventions and patient self-care actives. ? ?Recommendation: Patient may benefit from CCM RN but declines additional support at this time, he is  in agreement to review educational information mailed in AVS for anxiety.  ? ?Follow up Plan: Patient would like continued follow-up from CCM LCSW.  per patient's request will follow up in 30 days.  Will call office if needed prior to  next encounter. ?  ? ?Assessment: Review of patient past medical history, allergies, medications, and health status, including review of relevant consultants reports was performed today as part of a comprehensive evaluation and provision of chronic care management and care coordination services. ? ?SDOH (Social Determinants of Health) assessments and interventions performed:  ?SDOH Interventions   ? ?Flowsheet Row Most Recent Value  ?SDOH Interventions   ?Financial Strain Interventions Other (Comment)  ?Stress Interventions Provide Counseling  ? ?  ?  ? ?Advanced Directives Status: Not addressed in this encounter. ? ?Care Plan ?Conditions to be addressed/monitored: Anxiety;  ? ?Care Plan : LCSW Plan of Care  ?Updates made by [name], LCSW since 05/21/2021 12:00 AM  ?  ? ?Problem: Coping Skills   ?  ? ?Goal: Coping Skills Enhanced   ?Start Date: 05/15/2021  ?This Visit's Progress: On track  ?Recent Progress: On track  ?Priority: High  ?Note:   ?Current Barriers:  ?Disease Management support and education needs related to adjustment reaction with symptoms of anxiety ? ?CSW Clinical Goal(s):  ?Patient  will verbalize understanding of plan for management of Anxiety  through collaboration with Clinical Social Worker, provider, and care team.  ? ?Interventions: ?Inter-disciplinary care team collaboration (see longitudinal plan of care) ?Evaluation of current treatment plan related to  self management and patient's adherence to plan as established by provider ? ?Mental Health:  (Status: New goal. Pending at this time would like f/u in 30 days ) ?Evaluation of current treatment plan related to symptoms of Anxiety ?Motivational Interviewing employed ?Active listening / Reflection utilized  ?Emotional Support Provided ?Problem Solving /Task  Center strategies reviewed ?Provided psychoeducation for mental health needs  ?Participation in counseling encouraged  ?Discussed and explored therapy options  ?Suicidal  Ideation/Homicidal Ideation assessed: denied ? ?Task & activities to accomplish goals: ?Review attached educational information in your after visit summary ?You would like to wait before moving forward with connecting for counseling ?  ?  ?[name], LCSW ?Licensed Clinical Social Worker [name]  ?[place]  ?[phone]  ?

## 2021-05-21 NOTE — Patient Instructions (Addendum)
Visit Information ? ?Thank you for taking time to visit with me today. Please don't hesitate to contact me if I can be of assistance to you before our next scheduled telephone appointment. ? ?Following are the goals we discussed today: managing symptoms of Anxiety ?Task & activities to accomplish goals: ?Review attached educational information in your after visit summary ?You would like to wait before moving forward with connecting for counseling ? ?Our next appointment is by telephone on June 5th at 11:00 ? ?Please call the care guide team at [phone] if you need to cancel or reschedule your appointment.  ? ?If you are experiencing a Mental Health or [place] or need someone to talk to, please call the Suicide and Crisis Lifeline: 988 ?call the [place] National Suicide Prevention Lifeline: (501)866-8809 or TTY: (418)147-7439 TTY (314)306-5783) to talk to a trained counselor ?call 1-800-273-TALK (toll free, 24 hour hotline) ?go to [facility] [address] [place] [phone])  ? ?The patient verbalized understanding of instructions, educational materials, and care plan provided today and agreed to receive a mailed copy of patient instructions, educational materials, and care plan.  ? ?[name], LCSW ?Licensed Clinical Social Worker [name]  ?[place]  ?[phone]  ?

## 2021-05-24 ENCOUNTER — Other Ambulatory Visit: Payer: Self-pay | Admitting: Internal Medicine

## 2021-05-24 DIAGNOSIS — E119 Type 2 diabetes mellitus without complications: Secondary | ICD-10-CM

## 2021-05-29 ENCOUNTER — Telehealth (HOSPITAL_COMMUNITY): Payer: Self-pay

## 2021-05-29 NOTE — Telephone Encounter (Signed)
Pt is not interested in the cardiac rehab program. Closed referral 

## 2021-06-08 ENCOUNTER — Telehealth: Payer: Self-pay | Admitting: *Deleted

## 2021-06-08 NOTE — Telephone Encounter (Signed)
Pt's ex-wife Arbie Cookey on [place]) was in the office to see Dr Marlou Porch.  She is reporting patient has been having episodes of dizziness and sometimes disorientation.  The dizziness appears to be chronic, based on previous documentation in chart. According to Arbie Cookey "disorientation" is new since recent stent placement.  She is unsure of his recent blood pressures but states they have been "good."  She also reports pt becomes frustrated and upset (crying) at times while trying to accomplish tasks.  Is having c/o occasional numbness in arms and hands that resolves on it's own.  Pt has recently quit smoking and was using a nicotine patch which he stopped using.  He is now trying to quit "cold [place]."  [employer] everything he has been through recently, trying to quit smoking and his already diagnosed anxiety can be causing the being easily frustrated/upset.  Panic attacks and anxiety can also cause the arm/hand numbness.  This is not a new s/s, has been occurring for some time.  He has no s/s concerning for CVA (slurred speech, one sided weakness etc).  He is not having any chest/arm/back/jaw pain and no c/o SOB.  Advised PCP should be able to assist him in the treatment of his anxiety and stress.  Arbie Cookey states understanding and will call back if any further concerns.

## 2021-06-25 ENCOUNTER — Ambulatory Visit: Payer: Medicare HMO | Admitting: Licensed Clinical Social Worker

## 2021-06-25 ENCOUNTER — Encounter: Payer: Self-pay | Admitting: Licensed Clinical Social Worker

## 2021-06-25 DIAGNOSIS — F4329 Adjustment disorder with other symptoms: Secondary | ICD-10-CM

## 2021-06-25 NOTE — Chronic Care Management (AMB) (Signed)
Care Management Clinical Social Work Note  06/25/2021 Name: Allen Whitehead MRN: [id number] DOB: 06-Jun-1950  Allen Whitehead is a 71 y.o. year old male who is a primary care patient of [name], MD.  The Care Management team was consulted for assistance with chronic disease management and coordination needs.  Engaged with patient by telephone for follow up visit in response to provider referral for social work chronic care management and care coordination services  Consent to Services:  Allen Whitehead was given information about Care Management services today including:  Care Management services includes personalized support from designated clinical staff supervised by his physician, including individualized plan of care and coordination with other care providers 24/7 contact phone numbers for assistance for urgent and routine care needs. The patient may stop case management services at any time by phone call to the office staff.  Patient agreed to services and consent obtained.   Summary:  Patient reports he is doing well with managing stress and reaction to his heath. His main concern is his health related to managing his dizziness.  He is not interested in starting medication to manage his mood nor psychotherapy at this time .  See Care Plan below for interventions and patient self-care actives.  Recommendation: Patient may benefit from, and is in agreement to review educational information with AVS for Stress reduction.   Follow up Plan: Patient would like continued follow-up from CCM LCSW.  per patient's request will follow up in 45 days.  Will call office if needed prior to next encounter.   Assessment: Review of patient past medical history, allergies, medications, and health status, including review of relevant consultants reports was performed today as part of a comprehensive evaluation and provision of chronic care management and care coordination services.  SDOH (Social  Determinants of Health) assessments and interventions performed:    Advanced Directives Status: Not addressed in this encounter.  Care Plan Conditions to be addressed/monitored: Anxiety and stress ; Adjustment reaction   Care Plan : LCSW Plan of Care  Updates made by [name], LCSW since 06/25/2021 12:00 AM     Problem: Coping Skills      Goal: Coping Skills Enhanced   Start Date: 05/15/2021  This Visit's Progress: On track  Recent Progress: On track  Priority: High  Note:   Current Barriers:  Disease Management support and education needs related to adjustment reaction with symptoms of anxiety  CSW Clinical Goal(s):  Patient  will verbalize understanding of plan for management of Anxiety  through collaboration with Clinical Social Worker, provider, and care team.   Interventions: Inter-disciplinary care team collaboration (see longitudinal plan of care) Evaluation of current treatment plan related to  self management and patient's adherence to plan as established by provider  Mental Health:  (Status: Goal on Track (progressing): YES.) Evaluation of current treatment plan related to symptoms of Anxiety Motivational Interviewing employed Active listening / Reflection utilized  Provided psychoeducation for mental health needs  Participation in counseling encouraged   Task & activities to accomplish goals: Review attached educational information in your after visit summary Stress Reduction       [name], LCSW Licensed Clinical Social Worker [facility] [place]  [phone]

## 2021-06-25 NOTE — Patient Instructions (Addendum)
Visit Information  Thank you for taking time to visit with me today. Please don't hesitate to contact me if I can be of assistance to you before our next scheduled telephone appointment.  Following are the goals we discussed today: Managing your stress Task & activities to accomplish goals: Review attached educational information in your after visit summary Stress Reduction   Our next appointment is by telephone on July 18th at 11:00  Please call the care guide team at [phone] if you need to cancel or reschedule your appointment.   If you are experiencing a Mental Health or [place] or need someone to talk to, please call the Suicide and Crisis Lifeline: 988 call 1-800-273-TALK (toll free, 24 hour hotline) go to [facility] [place] [phone])   The patient verbalized understanding of instructions, educational materials, and care plan provided today and agreed to receive a mailed copy of patient instructions, educational materials, and care plan.   [name], LCSW Licensed Clinical Social Worker [name]  [place]  [phone]

## 2021-06-27 ENCOUNTER — Telehealth: Payer: Self-pay | Admitting: Internal Medicine

## 2021-06-27 ENCOUNTER — Other Ambulatory Visit (HOSPITAL_COMMUNITY): Payer: Self-pay

## 2021-06-27 DIAGNOSIS — I10 Essential (primary) hypertension: Secondary | ICD-10-CM

## 2021-06-27 DIAGNOSIS — Z79899 Other long term (current) drug therapy: Secondary | ICD-10-CM

## 2021-06-27 MED ORDER — OLMESARTAN MEDOXOMIL 40 MG PO TABS
40.0000 mg | ORAL_TABLET | Freq: Every day | ORAL | 1 refills | Status: DC
  Filled 2021-06-27 – 2021-08-15 (×2): qty 90, 90d supply, fill #0
  Filled 2021-12-10: qty 90, 90d supply, fill #1

## 2021-06-27 MED ORDER — OLMESARTAN MEDOXOMIL 20 MG PO TABS
20.0000 mg | ORAL_TABLET | Freq: Two times a day (BID) | ORAL | 1 refills | Status: DC
  Filled 2021-06-27: qty 180, 90d supply, fill #0

## 2021-06-27 NOTE — Telephone Encounter (Addendum)
Pharmacist Latanya Presser) from [facility] outpatient pharmacy called to inform us that pt came in today to pick up Rx and was asking about his Benicar being filled. Pt filled Rx on 5/1 (Rx sent in was 40 mg daily).  Pt informed the pharmD he was taking it BID.   Says his AVS from [place] told him to take his medication BID.  He did not know the mg sent in was 40 mg as he was taking 20 mg daily prior. BPs WNL according to pt, although he hasn't taking it like he used to.  Advised to monitor going forward, at least daily, and let office know if sees it consistently above 140/80s.  Discussed w/ Dr. Gasper Sells. Will add BMET to 6/27 labs.   Called pt who reports dizziness, but mentions this is not new and has had vertigo for 16 yrs. Aware new Rx being sent to pharmacy. Patient verbalized understanding and agreeable to plan.

## 2021-06-27 NOTE — Telephone Encounter (Signed)
Pt c/o medication issue:  1. Name of Medication: olmesartan (BENICAR) 40 MG tablet  2. How are you currently taking this medication (dosage and times per day)? Take 1 tablet (40 mg total) by mouth daily  3. Are you having a reaction (difficulty breathing--STAT)? No   4. What is your medication issue? Patient was taking medication incorrectly. And too early for refill

## 2021-06-27 NOTE — Addendum Note (Signed)
Addended by: Stanton Kidney on: 06/27/2021 03:07 PM   Modules accepted: Orders

## 2021-06-27 NOTE — Addendum Note (Signed)
Addended by: Stanton Kidney on: 06/27/2021 03:06 PM   Modules accepted: Orders

## 2021-06-27 NOTE — Telephone Encounter (Signed)
PharmD called [place] back to inform us that insurance will not cover twice a day Rx for Benicar.  So resorting back to 40 mg daily Rx.  Pt called again and informed of plan and what to take going forward, which is a 40 mg once daily. New Rx sent into pharmacy Patient verbalized understanding and agreeable to plan.

## 2021-06-27 NOTE — Addendum Note (Signed)
Addended by: Stanton Kidney on: 06/27/2021 02:57 PM   Modules accepted: Orders

## 2021-07-04 NOTE — Progress Notes (Unsigned)
Cardiology Office Note    Date:  07/04/2021   ID:  Allen Whitehead, DOB Jun 13, 1950, MRN [id number]   PCP:  [name], MD   [place]  Cardiologist:  [name], MD *** Advanced Practice Provider:  No care team member to display Electrophysiologist:  None   [phone]   No chief complaint on file.   History of Present Illness:  Allen Whitehead is a 71 y.o. male  with HTN, DM, Tobacco abuse, Rheumatoid arthritis, Elevated plasma metanephrines NOS (unable to get further evaluation with insurance issues), CAC and aortic atherosclerosis    Allen Whitehead remotely saw Dr. Marlou Porch in 2015 for near syncope and postural tachycardia with largely reassuring echo with EF 60-65%, grade 1 DD, moderate LVH, did not need to follow-up with cardiology at that time. Lung CA screening CT in 03/2021 did show coronary calcification. He has a history of elevated metanephrines on labwork by PCP who ordered MR of the abdomen but insurance reportedly denied this.    Patient had NSTEMI 04/23/21 DES LAD 50% Cfx, echo LVEF 60-65%, transient 2:1 heart block during nocturnal hours BB stopped.  I saw the patient 05/16/21 and having a lot of anxiety. I increased benicar 20 mg bid but insurance would only cover 40 mg once daily.      Past Medical History:  Diagnosis Date   Anxiety    Arthritis    CAD (coronary artery disease), native coronary artery    04/23/21: 80% pLAD-DES, 50% Cx treated medically   Depression    Diabetes mellitus without complication (HCC)    Elevated plasma metanephrines    GERD (gastroesophageal reflux disease)    past hx    Hyperlipidemia with target LDL less than 70    Hypertension    Kidney stones     Past Surgical History:  Procedure Laterality Date   BIOPSY  02/15/2020   Procedure: BIOPSY;  Surgeon: [name], MD;  Location: [facility];  Service: Endoscopy;;   COLONOSCOPY     age 60 for blood in stool- Normal    COLONOSCOPY  WITH PROPOFOL N/A 02/15/2020   Procedure: COLONOSCOPY WITH PROPOFOL;  Surgeon: [name], MD;  Location: [facility];  Service: Endoscopy;  Laterality: N/A;   CORONARY STENT INTERVENTION N/A 04/23/2021   Procedure: CORONARY STENT INTERVENTION;  Surgeon: [name], MD;  Location: [facility];  Service: Cardiovascular;  Laterality: N/A;   ENDOSCOPIC MUCOSAL RESECTION  02/15/2020   Procedure: ENDOSCOPIC MUCOSAL RESECTION;  Surgeon: [name], MD;  Location: [facility];  Service: Endoscopy;;   kidney stone retrieval     lasar treatment on [place]     LEFT HEART CATH AND CORONARY ANGIOGRAPHY N/A 04/23/2021   Procedure: LEFT HEART CATH AND CORONARY ANGIOGRAPHY;  Surgeon: [name], MD;  Location: [facility];  Service: Cardiovascular;  Laterality: N/A;   POLYPECTOMY  02/15/2020   Procedure: POLYPECTOMY;  Surgeon: [name], MD;  Location: [facility];  Service: Endoscopy;;   SUBMUCOSAL LIFTING INJECTION  02/15/2020   Procedure: SUBMUCOSAL LIFTING INJECTION;  Surgeon: [name], MD;  Location: [facility];  Service: Endoscopy;;   VASECTOMY      Current Medications: No outpatient medications have been marked as taking for the 07/17/21 encounter (Appointment) with Imogene Burn, PA-C.     Allergies:   Patient has no known allergies.   Social History   Socioeconomic History   Marital status:  Legally Separated    Spouse name: Not on file   Number of children: Not on file   Years of education: Not on file   Highest education level: Not on file  Occupational History   Not on file  Tobacco Use   Smoking status: Former    Packs/day: 1.00    Years: 30.00    Total pack years: 30.00    Types: Cigarettes    Quit date: 04/24/2021    Years since quitting: 0.1    Passive exposure: Past   Smokeless tobacco: Never  Vaping Use   Vaping Use: Never used  Substance and Sexual Activity   Alcohol use: Yes    Alcohol/week: 2.0  standard drinks of alcohol    Types: 2 Cans of beer per week    Comment: occasionally   Drug use: No   Sexual activity: Yes    Partners: Female  Other Topics Concern   Not on file  Social History Narrative   Not on file   Social Determinants of Health   Financial Resource Strain: Medium Risk (05/21/2021)   Overall Financial Resource Strain (CARDIA)    Difficulty of Paying Living Expenses: Somewhat hard  Food Insecurity: No Food Insecurity (05/21/2021)   Hunger Vital Sign    Worried About Running Out of Food in the Last Year: Never true    Ran Out of Food in the Last Year: Never true  Transportation Needs: No Transportation Needs (05/21/2021)   PRAPARE - Hydrologist (Medical): No    Lack of Transportation (Non-Medical): No  Physical Activity: Not on file  Stress: Stress Concern Present (05/21/2021)   [place]    Feeling of Stress : Rather much  Social Connections: Not on file     Family History:  The patient's ***family history includes Alzheimer's disease in his mother; Cancer in his father and mother; Colon cancer in an other family member; Diabetes in his father; Hypertension in his mother; Lung cancer in his brother and father.   ROS:   Please see the history of present illness.    ROS All other systems reviewed and are negative.   PHYSICAL EXAM:   VS:  There were no vitals taken for this visit.  Physical Exam  GEN: Well nourished, well developed, in no acute distress  HEENT: normal  Neck: no JVD, carotid bruits, or masses Cardiac:RRR; no murmurs, rubs, or gallops  Respiratory:  clear to auscultation bilaterally, normal work of breathing GI: soft, nontender, nondistended, + BS Ext: without cyanosis, clubbing, or edema, Good distal pulses bilaterally MS: no deformity or atrophy  Skin: warm and dry, no rash Neuro:  Alert and Oriented x 3, Strength and sensation are  intact Psych: euthymic mood, full affect  Wt Readings from Last 3 Encounters:  05/16/21 213 lb (96.6 kg)  05/09/21 211 lb (95.7 kg)  04/24/21 210 lb 8.6 oz (95.5 kg)      Studies/Labs Reviewed:   EKG:  EKG is*** ordered today.  The ekg ordered today demonstrates ***  Recent Labs: 04/22/2021: TSH 1.708 04/23/2021: ALT 22 04/24/2021: BUN 12; Creatinine, Ser 0.78; Hemoglobin 12.7; Platelets 204; Potassium 3.7; Sodium 140   Lipid Panel    Component Value Date/Time   CHOL 168 04/23/2021 0047   CHOL 162 08/09/2019 1747   TRIG 99 04/23/2021 0047   HDL 32 (L) 04/23/2021 0047   HDL 37 (L) 08/09/2019 1747   CHOLHDL 5.3 04/23/2021  0047   VLDL 20 04/23/2021 0047   LDLCALC 116 (H) 04/23/2021 0047   LDLCALC 107 (H) 08/09/2019 1747    Additional studies/ records that were reviewed today include:  Left heart cath 04/23/21:   Prox LAD lesion is 80% stenosed.   A drug-eluting stent was successfully placed using a STENT ONYX FRONTIER 3.5X18, postdilated to 3.8 mm and optimized with intravascular ultrasound.   Mid Cx lesion is 50% stenosed.  Cross-sectional area 4.57 mm by intravascular ultrasound.   Post intervention, there is a 0% residual stenosis.   The left ventricular systolic function is normal.   LV end diastolic pressure is normal.   The left ventricular ejection fraction is 55-65% by visual estimate.   There is no aortic valve stenosis.   Severe LAD disease.  This was successfully treated with a drug-eluting stent as noted above.  Continue dual antiplatelet therapy for 12 months.  Consider clopidogrel monotherapy after 12 months.  Moderate circumflex disease.  Adequate cross-sectional area.  Continue aggressive secondary prevention. _____________   Echo 04/23/21:  1. Left ventricular ejection fraction, by estimation, is 60 to 65%. The  left ventricle has normal function. The left ventricle has no regional  wall motion abnormalities. There is mild left ventricular hypertrophy.   Left ventricular diastolic parameters  were normal.   2. Right ventricular systolic function is normal. The right ventricular  size is normal.   3. The mitral valve is normal in structure. No evidence of mitral valve  regurgitation. No evidence of mitral stenosis.   4. The aortic valve is tricuspid. Aortic valve regurgitation is not  visualized. Aortic valve sclerosis is present, with no evidence of aortic  valve stenosis.   5. Aortic dilatation noted. There is borderline dilatation of the aortic  root, measuring 38 mm. There is mild dilatation of the ascending aorta,  measuring 42 mm.   6. The inferior vena cava is normal in size with greater than 50%  respiratory variability, suggesting right atrial pressure of 3 mmHg.     Risk Assessment/Calculations:   {Does this patient have ATRIAL FIBRILLATION?[phone]}     ASSESSMENT:    No diagnosis found.   PLAN:  In order of problems listed above:  CAD NSTEMI DES LAD with residual 50% Cfx on ASA/Brilinta/Statin/ARB-normal LVEF. No chest pain, walking 1 mile daily.   Transient 2:1 AV block during nocturnal hrs BB stopped   HTN BP up at night-increase benicar 20 mg bid   HLD on crestor-check FLP in 3 months   DM per PCP on metformin   Tobacco abuse-didn't tolerate higher dose nicotine patch so stopped. Not smoking but upset because it gave him so much pleasure. I asked him to try lower dose nicotine patch.   Anxiety since MI and cigarette cessation. Has an appt to talk to someone.    Shared Decision Making/Informed Consent   {Are you ordering a CV Procedure (e.g. stress test, cath, DCCV, TEE, etc)?   Press F2        :[id number]}    Medication Adjustments/Labs and Tests Ordered: Current medicines are reviewed at length with the patient today.  Concerns regarding medicines are outlined above.  Medication changes, Labs and Tests ordered today are listed in the Patient Instructions below. There are no Patient Instructions  on file for this visit.   Sumner Boast, PA-C  07/04/2021 3:02 PM    [facility] [address], [place], [place]  [address] Phone: [phone]; Fax: [phone]    

## 2021-07-09 ENCOUNTER — Other Ambulatory Visit: Payer: Medicare HMO

## 2021-07-17 ENCOUNTER — Other Ambulatory Visit: Payer: Medicare HMO

## 2021-07-17 ENCOUNTER — Encounter: Payer: Self-pay | Admitting: Physician Assistant

## 2021-07-17 ENCOUNTER — Ambulatory Visit: Payer: Medicare HMO | Admitting: Physician Assistant

## 2021-07-17 ENCOUNTER — Ambulatory Visit (INDEPENDENT_AMBULATORY_CARE_PROVIDER_SITE_OTHER): Payer: Medicare HMO

## 2021-07-17 VITALS — BP 126/80 | HR 90 | Ht 71.0 in | Wt 218.0 lb

## 2021-07-17 DIAGNOSIS — I10 Essential (primary) hypertension: Secondary | ICD-10-CM | POA: Diagnosis not present

## 2021-07-17 DIAGNOSIS — F419 Anxiety disorder, unspecified: Secondary | ICD-10-CM

## 2021-07-17 DIAGNOSIS — R42 Dizziness and giddiness: Secondary | ICD-10-CM | POA: Diagnosis not present

## 2021-07-17 DIAGNOSIS — E119 Type 2 diabetes mellitus without complications: Secondary | ICD-10-CM | POA: Diagnosis not present

## 2021-07-17 DIAGNOSIS — I441 Atrioventricular block, second degree: Secondary | ICD-10-CM | POA: Diagnosis not present

## 2021-07-17 DIAGNOSIS — R69 Illness, unspecified: Secondary | ICD-10-CM | POA: Diagnosis not present

## 2021-07-17 DIAGNOSIS — Z720 Tobacco use: Secondary | ICD-10-CM

## 2021-07-17 DIAGNOSIS — I2510 Atherosclerotic heart disease of native coronary artery without angina pectoris: Secondary | ICD-10-CM | POA: Diagnosis not present

## 2021-07-17 DIAGNOSIS — E785 Hyperlipidemia, unspecified: Secondary | ICD-10-CM | POA: Diagnosis not present

## 2021-07-17 NOTE — Progress Notes (Unsigned)
Applied a 14 day Zio XT monitor to patient in the office  Dr Timoteo Gaul to read

## 2021-07-18 LAB — BASIC METABOLIC PANEL
BUN/Creatinine Ratio: 13 (ref 10–24)
BUN: 14 mg/dL (ref 8–27)
CO2: 24 mmol/L (ref 20–29)
Calcium: 9.8 mg/dL (ref 8.6–10.2)
Chloride: 102 mmol/L (ref 96–106)
Creatinine, Ser: 1.05 mg/dL (ref 0.76–1.27)
Glucose: 132 mg/dL — ABNORMAL HIGH (ref 70–99)
Potassium: 5.1 mmol/L (ref 3.5–5.2)
Sodium: 139 mmol/L (ref 134–144)
eGFR: 77 mL/min/{1.73_m2} (ref >59)

## 2021-07-18 LAB — CBC
Hematocrit: 40.6 % (ref 37.5–51.0)
Hemoglobin: 13.4 g/dL (ref 13.0–17.7)
MCH: 29.5 pg (ref 26.6–33.0)
MCHC: 33 g/dL (ref 31.5–35.7)
MCV: 89 fL (ref 79–97)
Platelets: 242 10*3/uL (ref 150–450)
RBC: 4.55 x10E6/uL (ref 4.14–5.80)
RDW: 13.6 % (ref 11.6–15.4)
WBC: 8.6 10*3/uL (ref 3.4–10.8)

## 2021-07-18 LAB — LIPID PANEL
Chol/HDL Ratio: 2.4 ratio (ref 0.0–5.0)
Cholesterol, Total: 99 mg/dL — ABNORMAL LOW (ref 100–199)
HDL: 42 mg/dL (ref >39)
LDL Chol Calc (NIH): 42 mg/dL (ref 0–99)
Triglycerides: 70 mg/dL (ref 0–149)
VLDL Cholesterol Cal: 15 mg/dL (ref 5–40)

## 2021-07-18 LAB — TSH: TSH: 2.58 u[IU]/mL (ref 0.450–4.500)

## 2021-08-02 DIAGNOSIS — R42 Dizziness and giddiness: Secondary | ICD-10-CM | POA: Diagnosis not present

## 2021-08-07 ENCOUNTER — Ambulatory Visit: Payer: Self-pay | Admitting: Licensed Clinical Social Worker

## 2021-08-07 NOTE — Patient Outreach (Signed)
Care Management Clinical Social Work Note  08/07/2021 Name: Allen Whitehead MRN: [id number] DOB: Jun 09, 1950  Allen Whitehead is a 71 y.o. year old male who is a primary care patient of [name], MD.  The Care Management team was consulted for assistance with chronic disease management and coordination needs.  Engaged with patient by telephone for follow up visit in response to provider referral for social work chronic care management and care coordination services  Consent to Services:  Allen Whitehead was given information about Care Management services today including:  Care Management services includes personalized support from designated clinical staff supervised by his physician, including individualized plan of care and coordination with other care providers 24/7 contact phone numbers for assistance for urgent and routine care needs. The patient may stop case management services at any time by phone call to the office staff.  Patient agreed to services and consent obtained.   Summary:  Patient is making progress with managing symptoms of anxiety and does not wish to connect for therapy .  See Care Plan below for interventions and patient self-care actives.  Follow up Plan:  Patient does not require continued follow-up by CCM Allen Whitehead. Will contact the office if needed Allen Whitehead will disconnect from patient's care team at this time,  Will be available at any time they would like to re-engage for care coordination services.   Care Plan  Conditions to be addressed/monitored: Anxiety;   Care Plan : Allen Whitehead Plan of Care  Updates made by Allen Cane, Allen Whitehead since 08/07/2021 12:00 AM     Problem: Coping Skills      Goal: Coping Skills Enhanced Completed 08/07/2021  Start Date: 05/15/2021  This Visit's Progress: On track  Recent Progress: On track  Priority: High  Note:   Current Barriers:  Disease Management support and education needs related to adjustment reaction with symptoms of  anxiety  Allen Clinical Goal(s):  Patient  will verbalize understanding of plan for management of Anxiety  through collaboration with Clinical Social Whitehead, provider, and care team.   Interventions: Inter-disciplinary care team collaboration (see longitudinal plan of care) Evaluation of current treatment plan related to  self management and patient's adherence to plan as established by provider  Mental Health:  (Status: Goal on Track (progressing): YES. Goal Not Met. Patient declined further engagement on this goal.) Evaluation of current treatment plan related to symptoms of Anxiety Motivational Interviewing employed Active listening / Reflection utilized  Provided psychoeducation for mental health needs  Participation in counseling encouraged   Task & activities to accomplish goals: Review attached educational information in your after visit summary Stress Reduction      [name], [place] Licensed Clinical Social [facility] [employer] [phone]

## 2021-08-07 NOTE — Patient Instructions (Signed)
Visit Information  Thank you for taking time to visit with me today. Please don't hesitate to contact me if I can be of assistance to you before our next scheduled telephone appointment.  No follow up scheduled, per our conversation you do not require continued follow up I will disconnect from your care team at this time, please call the office if additional needs are identified   [name], LCSW Licensed Clinical Social Worker [name]  [place]  [phone]    Please call the care guide team at [phone] if you need to cancel or reschedule your appointment.   If you are experiencing a Mental Health or [place] or need someone to talk to, please call the Suicide and Crisis Lifeline: 988 call the [place] National Suicide Prevention Lifeline: 2345805955 or TTY: (212)564-1581 TTY 210-480-8039) to talk to a trained counselor call 1-800-273-TALK (toll free, 24 hour hotline) go to [facility] [address] [place] [phone])

## 2021-08-15 ENCOUNTER — Other Ambulatory Visit (HOSPITAL_COMMUNITY): Payer: Self-pay

## 2021-09-04 ENCOUNTER — Encounter: Payer: Self-pay | Admitting: Internal Medicine

## 2021-09-04 ENCOUNTER — Ambulatory Visit: Payer: Medicare HMO | Admitting: Internal Medicine

## 2021-09-04 VITALS — BP 132/80 | HR 94 | Ht 71.0 in | Wt 218.0 lb

## 2021-09-04 DIAGNOSIS — I2510 Atherosclerotic heart disease of native coronary artery without angina pectoris: Secondary | ICD-10-CM

## 2021-09-04 DIAGNOSIS — Z720 Tobacco use: Secondary | ICD-10-CM | POA: Diagnosis not present

## 2021-09-04 DIAGNOSIS — E1169 Type 2 diabetes mellitus with other specified complication: Secondary | ICD-10-CM

## 2021-09-04 DIAGNOSIS — E785 Hyperlipidemia, unspecified: Secondary | ICD-10-CM

## 2021-09-04 DIAGNOSIS — I700 Atherosclerosis of aorta: Secondary | ICD-10-CM | POA: Diagnosis not present

## 2021-09-04 NOTE — Progress Notes (Signed)
Cardiology Office Note:    Date:  09/04/2021   ID:  Allen Whitehead, DOB 05-11-1950, MRN [id number]  PCP:  [name], MD   [facility] Cardiologist:  [name], MD     Referring MD: [name], MD   CC: f/u NSTEMI  History of Present Illness:    Allen Whitehead is a 71 y.o. male with a hx of HTN with DM, RA, Tobacco abuse, Elevated plasma metanephrines NOS (unable to get further evaluation with insurance issues), CAC and aortic atherosclerosis, Mild Aortic dilation  seen initially for NSTEMI 2023: had PCI, BB stopped for nocturnal HB, has hypotension on high dose benicar; this was decreased to daily.  Patient notes that he is doing OK.   Since last visit notes that he has financial distress . There are no interval hospital/ED visit.   Has started back smoking.  We have discussed her stressors at length.  No chest pain or pressure .  No SOB/DOE and no PND/Orthopnea.  No weight gain or leg swelling.  No palpitations or syncope.   Past Medical History:  Diagnosis Date   Anxiety    Arthritis    CAD (coronary artery disease), native coronary artery    04/23/21: 80% pLAD-DES, 50% Cx treated medically   Depression    Diabetes mellitus without complication (HCC)    Elevated plasma metanephrines    GERD (gastroesophageal reflux disease)    past hx    Hyperlipidemia with target LDL less than 70    Hypertension    Kidney stones     Past Surgical History:  Procedure Laterality Date   BIOPSY  02/15/2020   Procedure: BIOPSY;  Surgeon: [name], MD;  [facility];  Service: Endoscopy;;   COLONOSCOPY     age 47 for blood in stool- Normal    COLONOSCOPY WITH PROPOFOL N/A 02/15/2020   Procedure: COLONOSCOPY WITH PROPOFOL;  Surgeon: [name], MD;  [facility];  Service: Endoscopy;  Laterality: N/A;   CORONARY STENT INTERVENTION N/A 04/23/2021   Procedure: CORONARY STENT INTERVENTION;  Surgeon:  [name], MD;  Location: [place] [facility];  Service: Cardiovascular;  Laterality: N/A;   ENDOSCOPIC MUCOSAL RESECTION  02/15/2020   Procedure: ENDOSCOPIC MUCOSAL RESECTION;  Surgeon: [name], MD;  [facility];  Service: Endoscopy;;   kidney stone retrieval     lasar treatment on [place]     LEFT HEART CATH AND CORONARY ANGIOGRAPHY N/A 04/23/2021   Procedure: LEFT HEART CATH AND CORONARY ANGIOGRAPHY;  Surgeon: [name], MD;  Location: [place] [facility];  Service: Cardiovascular;  Laterality: N/A;   POLYPECTOMY  02/15/2020   Procedure: POLYPECTOMY;  Surgeon: [name], MD;  [facility];  Service: Endoscopy;;   SUBMUCOSAL LIFTING INJECTION  02/15/2020   Procedure: SUBMUCOSAL LIFTING INJECTION;  Surgeon: [name], MD;  [facility];  Service: Endoscopy;;   VASECTOMY      Current Medications: Current Meds  Medication Sig   aspirin 81 MG EC tablet Take 1 tablet (81 mg total) by mouth daily. Swallow whole.   metFORMIN (GLUCOPHAGE) 500 MG tablet TAKE 1 TABLET BY MOUTH EVERY DAY WITH BREAKFAST   nicotine (NICODERM CQ - DOSED IN MG/24 HOURS) 21 mg/24hr patch Place 1 patch (21 mg total) onto the skin daily.   nitroGLYCERIN (NITROSTAT) 0.4 MG SL tablet Place 1 tablet (0.4 mg total) under the tongue every 5 (five) minutes x  3 doses as needed for chest pain.   olmesartan (BENICAR) 40 MG tablet Take 1 tablet (40 mg total) by mouth daily.   rosuvastatin (CRESTOR) 40 MG tablet Take 1 tablet (40 mg total) by mouth daily.   ticagrelor (BRILINTA) 90 MG TABS tablet Take 1 tablet (90 mg total) by mouth 2 (two) times daily.     Allergies:   Patient has no known allergies.   Social History   Socioeconomic History   Marital status: Legally Separated    Spouse name: Not on file   Number of children: Not on file   Years of education: Not on file   Highest education level: Not on file  Occupational History   Not on file   Tobacco Use   Smoking status: Former    Packs/day: 1.00    Years: 30.00    Total pack years: 30.00    Types: Cigarettes    Quit date: 04/24/2021    Years since quitting: 0.3    Passive exposure: Past   Smokeless tobacco: Never  Vaping Use   Vaping Use: Never used  Substance and Sexual Activity   Alcohol use: Yes    Alcohol/week: 2.0 standard drinks of alcohol    Types: 2 Cans of beer per week    Comment: occasionally   Drug use: No   Sexual activity: Yes    Partners: Female  Other Topics Concern   Not on file  Social History Narrative   Not on file   Social Determinants of Health   Financial Resource Strain: Medium Risk (05/21/2021)   Overall Financial Resource Strain (CARDIA)    Difficulty of Paying Living Expenses: Somewhat hard  Food Insecurity: No Food Insecurity (05/21/2021)   Hunger Vital Sign    Worried About Running Out of Food in the Last Year: Never true    Ran Out of Food in the Last Year: Never true  Transportation Needs: No Transportation Needs (05/21/2021)   PRAPARE - Hydrologist (Medical): No    Lack of Transportation (Non-Medical): No  Physical Activity: Not on file  Stress: Stress Concern Present (05/21/2021)   [place]    Feeling of Stress : Rather much  Social Connections: Not on file    Social: Has been married three times Now lives with his first ex-wife Renting a house from his second ex-wife Finances are tight Grandfather was a Pharmacist, community and went to [employer] Patient he a former Airline pilot Has two daugthers  Family History: The patient's family history includes Alzheimer's disease in his mother; Cancer in his father and mother; Colon cancer in an other family member; Diabetes in his father; Hypertension in his mother; Lung cancer in his brother and father. There is no history of Colon polyps, Esophageal cancer, Stomach cancer, or Rectal  cancer.  ROS:   Please see the history of present illness.     All other systems reviewed and are negative.  EKGs/Labs/Other Studies Reviewed:    The following studies were reviewed today:  LEFT HEART CATH AND CORONARY ANGIOGRAPHY, LEFT HEART CATH AND CORONARY ANGIOGRAPHY 04/23/2021  Narrative   Prox LAD lesion is 80% stenosed.   A drug-eluting stent was successfully placed using a STENT ONYX FRONTIER 3.5X18, postdilated to 3.8 mm and optimized with intravascular ultrasound.   Mid Cx lesion is 50% stenosed.  Cross-sectional area 4.57 mm by intravascular ultrasound.   Post intervention, there is a 0% residual stenosis.  The left ventricular systolic function is normal.   LV end diastolic pressure is normal.   The left ventricular ejection fraction is 55-65% by visual estimate.   There is no aortic valve stenosis.  Severe LAD disease.  This was successfully treated with a drug-eluting stent as noted above.  Continue dual antiplatelet therapy for 12 months.  Consider clopidogrel monotherapy after 12 months.    ECHO COMPLETE WITH IMAGING ENHANCING AGENT 04/23/2021 1. Left ventricular ejection fraction, by estimation, is 60 to 65%. The left ventricle has normal function. The left ventricle has no regional wall motion abnormalities. There is mild left ventricular hypertrophy. Left ventricular diastolic parameters were normal. 2. Right ventricular systolic function is normal. The right ventricular size is normal. 3. The mitral valve is normal in structure. No evidence of mitral valve regurgitation. No evidence of mitral stenosis. 4. The aortic valve is tricuspid. Aortic valve regurgitation is not visualized. Aortic valve sclerosis is present, with no evidence of aortic valve stenosis. 5. Aortic dilatation noted. There is borderline dilatation of the aortic root, measuring 38 mm. There is mild dilatation of the ascending aorta, measuring 42 mm. 6. The inferior vena cava is normal in size  with greater than 50% respiratory variability, suggesting right atrial pressure of 3 mmHg.    LONG TERM MONITOR(3-7 DAYS) HOOK UP AND INTERPRETATION 08/02/2021  Narrative   Patient had a minimum heart rate of 23 bpm, maximum heart rate of 197 bpm, and average heart rate of 84 bpm.   Predominant underlying rhythm was sinus rhythm.   Paroxysmal SVT lasting 17 seconds at longest with a max rate of 197 bpm at fastest.   Isolated PACs were rare (<1.0%).   Isolated PVCs were rare (<1.0%).   Second Degree AV Block-Mobitz I (Wenckebach) was present.   No triggered and diary events.  Asymptomatic SVT with asymptomatic nocturnal bradycardia and Wenckebach heart block.     US AORTA 02/15/2021  Narrative CLINICAL DATA:  History of bruit.  EXAM: ULTRASOUND OF ABDOMINAL AORTA  TECHNIQUE: Ultrasound examination of the abdominal aorta and proximal common iliac arteries was performed to evaluate for aneurysm. Additional color and Doppler images of the distal aorta were obtained to document patency.  COMPARISON:  CT chest 11/13/2013.  FINDINGS: Abdominal aortic measurements as follows:  Proximal:  Obscured by bowel gas.  Mid:  2.3 cm  Distal:  2.3 cm Patent: Yes, peak systolic velocity is 518 cm/s  Right common iliac artery: 1.4 cm  Left common iliac artery: 1.3 cm  IMPRESSION: No evidence of abdominal aortic aneurysm. Proximal aorta is obscured by overlying bowel gas.     Recent Labs: 04/23/2021: ALT 22 07/17/2021: BUN 14; Creatinine, Ser 1.05; Hemoglobin 13.4; Platelets 242; Potassium 5.1; Sodium 139; TSH 2.580  Recent Lipid Panel    Component Value Date/Time   CHOL 99 (L) 07/17/2021 0934   TRIG 70 07/17/2021 0934   HDL 42 07/17/2021 0934   CHOLHDL 2.4 07/17/2021 0934   CHOLHDL 5.3 04/23/2021 0047   VLDL 20 04/23/2021 0047   LDLCALC 42 07/17/2021 0934        Physical Exam:    VS:  BP 132/80   Pulse 94   Ht '5\' 11"'$  (1.803 m)   Wt 218 lb (98.9 kg)   SpO2 97%    BMI 30.40 kg/m     Wt Readings from Last 3 Encounters:  09/04/21 218 lb (98.9 kg)  07/17/21 218 lb (98.9 kg)  05/16/21 213 lb (96.6 kg)  Gen: No distress    Neck: No JVD Cardiac: No Rubs or Gallops, no murmur, RRR +2 radial pulses Respiratory: Clear to auscultation bilaterally, normal effort, normal  respiratory rate GI: Soft, nontender, non-distended  MS: No  edema;  moves all extremities Integument: Skin feels warm Neuro:  At time of evaluation, alert and oriented to person/place/time/situation  Psych: Normal affect  ASSESSMENT:    1. Coronary artery disease involving native coronary artery of native heart without angina pectoris   2. Continuous tobacco abuse   3. Hyperlipidemia associated with type 2 diabetes mellitus ([place])   4. Aortic atherosclerosis (HCC)    PLAN:    Coronary Artery Disease; Obstructive/Nonobstructive Aortic atherosclerosis HLD with DM With asymptomatic 2:1 Nocturnal HB Smoker (stopped in 2023) then restarted in the setting with  History of RA - anatomy: LAD disease moderate LCX disease - continue ASA 81 mg; We have offered to  switch to ticagrelor to plavix via plavix load; he would like to keep of ticagrelor which is reasonable - continue statin, goal LDL < 55 - No BB - continue PRN nitrates but has needed none - continue ARB - there is financial issues he is facing; we discussed smoking cessation strategies. - sildenafil can be started if he stop PRN nitrates which we   Six months f/u with me        Medication Adjustments/Labs and Tests Ordered: Current medicines are reviewed at length with the patient today.  Concerns regarding medicines are outlined above.  No orders of the defined types were placed in this encounter.  No orders of the defined types were placed in this encounter.   Patient Instructions  Medication Instructions:  Your physician recommends that you continue on your current medications as directed. Please refer to the  Current Medication list given to you today.  *If you need a refill on your cardiac medications before your next appointment, please call your pharmacy*   Lab Work: NONE If you have labs (blood work) drawn today and your tests are completely normal, you will receive your results only by: [place] (if you have MyChart) OR A paper copy in the mail If you have any lab test that is abnormal or we need to change your treatment, we will call you to review the results.   Testing/Procedures: NONE   Follow-Up: At [facility], you and your health needs are our priority.  As part of our continuing mission to provide you with exceptional heart care, we have created designated Provider Care Teams.  These Care Teams include your primary Cardiologist (physician) and Advanced Practice Providers (APPs -  Physician Assistants and Nurse Practitioners) who all work together to provide you with the care you need, when you need it.  We recommend signing up for the patient portal called "MyChart".  Sign up information is provided on this After Visit Summary.  MyChart is used to connect with patients for Virtual Visits (Telemedicine).  Patients are able to view lab/test results, encounter notes, upcoming appointments, etc.  Non-urgent messages can be sent to your provider as well.   To learn more about what you can do with MyChart, go to [email or web address].    Your next appointment:   6 month(s)  The format for your next appointment:   In Person  Provider:   [name], MD    Important Information About Sugar         Signed, [name], MD  09/04/2021 4:06 PM    [facility]

## 2021-09-04 NOTE — Patient Instructions (Signed)
Medication Instructions:  Your physician recommends that you continue on your current medications as directed. Please refer to the Current Medication list given to you today.  *If you need a refill on your cardiac medications before your next appointment, please call your pharmacy*   Lab Work: NONE If you have labs (blood work) drawn today and your tests are completely normal, you will receive your results only by: [place] (if you have MyChart) OR A paper copy in the mail If you have any lab test that is abnormal or we need to change your treatment, we will call you to review the results.   Testing/Procedures: NONE   Follow-Up: At [facility], you and your health needs are our priority.  As part of our continuing mission to provide you with exceptional heart care, we have created designated Provider Care Teams.  These Care Teams include your primary Cardiologist (physician) and Advanced Practice Providers (APPs -  Physician Assistants and Nurse Practitioners) who all work together to provide you with the care you need, when you need it.  We recommend signing up for the patient portal called "MyChart".  Sign up information is provided on this After Visit Summary.  MyChart is used to connect with patients for Virtual Visits (Telemedicine).  Patients are able to view lab/test results, encounter notes, upcoming appointments, etc.  Non-urgent messages can be sent to your provider as well.   To learn more about what you can do with MyChart, go to [email or web address].    Your next appointment:   6 month(s)  The format for your next appointment:   In Person  Provider:   [name], MD    Important Information About Sugar

## 2021-09-11 ENCOUNTER — Encounter: Payer: Self-pay | Admitting: Internal Medicine

## 2021-09-11 ENCOUNTER — Ambulatory Visit: Payer: Medicare HMO | Admitting: Internal Medicine

## 2021-09-11 VITALS — BP 120/84 | HR 65 | Ht 71.0 in | Wt 219.0 lb

## 2021-09-11 DIAGNOSIS — R7989 Other specified abnormal findings of blood chemistry: Secondary | ICD-10-CM

## 2021-09-11 NOTE — Progress Notes (Signed)
Name: Allen Whitehead  MRN/ DOB: [id number], September 01, 1950    Age/ Sex: 71 y.o., male    PCP: [name], MD   Reason for Endocrinology Evaluation: Elevated plasma metanephrines     Date of Initial Endocrinology Evaluation: 09/11/2021     HPI: [name] is a 71 y.o. male with a past medical history of CAD, HTN, T2DM and dyslipidemia. The patient presented for initial endocrinology clinic visit on 09/11/2021 for consultative assistance with his elevated plasma metanephrines.   During evaluation for malignant hypertension he was noted to have a slight elevation in plasma normetanephrine at 240 PG/mL (reference<148), but with normal metanephrine, aldosterone, and renin in December 2022.     Substantial weight gain- yes  Severe hypertension-has history of uncontrolled HTN but this has been under better control recently DM- yes Proximal muscle weakness-  Weight loss-no Anxiety attacks- has chronic depression and anxiety  Sweating-no Cardiac arrhythmias-no Palpitations-no Fluid retention-no Hypokalemia-no    Has chronic dizziness for the past 16 years  Had arthralgias that he attributes to COVID vaccine , had to see rheumatology . Currently this is resolved   HISTORY:  Past Medical History:  Past Medical History:  Diagnosis Date   Anxiety    Arthritis    CAD (coronary artery disease), native coronary artery    04/23/21: 80% pLAD-DES, 50% Cx treated medically   Depression    Diabetes mellitus without complication (HCC)    Elevated plasma metanephrines    GERD (gastroesophageal reflux disease)    past hx    Hyperlipidemia with target LDL less than 70    Hypertension    Kidney stones    Past Surgical History:  Past Surgical History:  Procedure Laterality Date   BIOPSY  02/15/2020   Procedure: BIOPSY;  Surgeon: [name], MD;  [facility];  Service: Endoscopy;;   COLONOSCOPY     age 84 for blood in stool- Normal    COLONOSCOPY WITH  PROPOFOL N/A 02/15/2020   Procedure: COLONOSCOPY WITH PROPOFOL;  Surgeon: [name], MD;  [facility];  Service: Endoscopy;  Laterality: N/A;   CORONARY STENT INTERVENTION N/A 04/23/2021   Procedure: CORONARY STENT INTERVENTION;  Surgeon: [name], MD;  Location: [place] [facility];  Service: Cardiovascular;  Laterality: N/A;   ENDOSCOPIC MUCOSAL RESECTION  02/15/2020   Procedure: ENDOSCOPIC MUCOSAL RESECTION;  Surgeon: [name], MD;  [facility];  Service: Endoscopy;;   kidney stone retrieval     lasar treatment on [place]     LEFT HEART CATH AND CORONARY ANGIOGRAPHY N/A 04/23/2021   Procedure: LEFT HEART CATH AND CORONARY ANGIOGRAPHY;  Surgeon: [name], MD;  Location: [place] [facility];  Service: Cardiovascular;  Laterality: N/A;   POLYPECTOMY  02/15/2020   Procedure: POLYPECTOMY;  Surgeon: [name], MD;  [facility];  Service: Endoscopy;;   SUBMUCOSAL LIFTING INJECTION  02/15/2020   Procedure: SUBMUCOSAL LIFTING INJECTION;  Surgeon: [name], MD;  [facility];  Service: Endoscopy;;   VASECTOMY      Social History:  reports that he quit smoking about 4 months ago. His smoking use included cigarettes. He has a 30.00 pack-year smoking history. He has been exposed to tobacco smoke. He has never used smokeless tobacco. He reports current alcohol use of about 2.0 standard drinks of alcohol per week. He reports that he does not use drugs. Family History: family history includes Alzheimer's disease in his  mother; Cancer in his father and mother; Colon cancer in an other family member; Diabetes in his father; Hypertension in his mother; Lung cancer in his brother and father.   HOME MEDICATIONS: Allergies as of 09/11/2021   No Known Allergies      Medication List        Accurate as of September 11, 2021  1:58 PM. If you have any questions, ask your nurse or doctor.          aspirin EC 81 MG  tablet Take 1 tablet (81 mg total) by mouth daily. Swallow whole.   Brilinta 90 MG Tabs tablet Generic drug: ticagrelor Take 1 tablet (90 mg total) by mouth 2 (two) times daily.   metFORMIN 500 MG tablet Commonly known as: GLUCOPHAGE TAKE 1 TABLET BY MOUTH EVERY DAY WITH BREAKFAST   nicotine 21 mg/24hr patch Commonly known as: NICODERM CQ - dosed in mg/24 hours Place 1 patch (21 mg total) onto the skin daily.   nitroGLYCERIN 0.4 MG SL tablet Commonly known as: NITROSTAT Place 1 tablet (0.4 mg total) under the tongue every 5 (five) minutes x 3 doses as needed for chest pain.   olmesartan 40 MG tablet Commonly known as: Benicar Take 1 tablet (40 mg total) by mouth daily.   rosuvastatin 40 MG tablet Commonly known as: CRESTOR Take 1 tablet (40 mg total) by mouth daily.          REVIEW OF SYSTEMS: A comprehensive ROS was conducted with the patient and is negative except as per HPI    OBJECTIVE:  VS: BP 120/84 (BP Location: Left Arm, Patient Position: Sitting, Cuff Size: Small)   Pulse 65   Ht 5' 11"  (1.803 m)   Wt 219 lb (99.3 kg)   SpO2 98%   BMI 30.54 kg/m    Wt Readings from Last 3 Encounters:  09/11/21 219 lb (99.3 kg)  09/04/21 218 lb (98.9 kg)  07/17/21 218 lb (98.9 kg)     EXAM: General: Pt appears well and is in NAD  Neck: General: Supple without adenopathy. Thyroid: Thyroid size normal.  No goiter or nodules appreciated.   Lungs: Clear with good BS bilat with no rales, rhonchi, or wheezes  Heart: Auscultation: RRR.  Abdomen: Normoactive bowel sounds, soft, nontender, without masses or organomegaly palpable  Extremities:  BL LE: No pretibial edema normal ROM and strength.  Mental Status: Judgment, insight: Intact Orientation: Oriented to time, place, and person Mood and affect: No depression, anxiety, or agitation     DATA REVIEWED:  Latest Reference Range & Units 07/17/21 09:34  Sodium 134 - 144 mmol/L 139  Potassium 3.5 - 5.2 mmol/L 5.1   Chloride 96 - 106 mmol/L 102  CO2 20 - 29 mmol/L 24  Glucose 70 - 99 mg/dL 132 (H)  BUN 8 - 27 mg/dL 14  Creatinine 0.76 - 1.27 mg/dL 1.05  Calcium 8.6 - 10.2 mg/dL 9.8  BUN/Creatinine Ratio 10 - 24  13  eGFR >59 mL/min/1.73 77     Latest Reference Range & Units Most Recent  ALDOSTERONE  ng/dL 4 01/03/21 14:44  ALDO / PRA Ratio 0.9 - 28.9 Ratio 3.2 01/03/21 14:44      Latest Reference Range & Units Most Recent  Metanephrine, Pl <=57 pg/mL 29 01/03/21 14:44  Normetanephrine, Pl <=148 pg/mL 240 (H) 01/03/21 14:44    ASSESSMENT/PLAN/RECOMMENDATIONS:   Elevated plasma metanephrine:  -Slight elevation in plasma metanephrine, patient with chronic anxiety  -Very low clinical suspicion for pheochromocytoma, typically  pheochromocytomas/paragangliomas will excrete metanephrine levels >3-5x upper limit of normal -We will proceed with 24-hour urinary catecholamines and metanephrines -I have reviewed proper urine collection with the patient and to keep the sample cold during collection    Follow-up will be determined upon his test results  Signed electronically by: Mack Guise, MD  [facility] Endocrinology  [address], [place] [address] Phone: [phone] FAX: [phone]   CC: [name], [place] [address] Phone: [phone] Fax: [phone]   Return to Endocrinology clinic as below: Future Appointments  Date Time Provider [place]  09/11/2021  2:20 PM Drelyn Pistilli, Melanie Crazier, MD [facility] None  10/11/2021  1:20 PM [name], MD [facility] None  04/01/2022  3:00 PM [name], MD [facility]

## 2021-09-11 NOTE — Patient Instructions (Signed)

## 2021-09-13 ENCOUNTER — Other Ambulatory Visit: Payer: Medicare HMO

## 2021-09-13 ENCOUNTER — Other Ambulatory Visit (HOSPITAL_COMMUNITY): Payer: Self-pay

## 2021-09-13 DIAGNOSIS — R7989 Other specified abnormal findings of blood chemistry: Secondary | ICD-10-CM | POA: Diagnosis not present

## 2021-09-13 NOTE — Progress Notes (Unsigned)
Total volume 3,050.  Started 09-12-21 5:30 am., ended 09-13-2021 at 6:00 am.

## 2021-09-19 LAB — METANEPHRINES, URINE, 24 HOUR
METANEPHRINE: 75 mcg/24 h — ABNORMAL LOW (ref 90–315)
METANEPHRINES, TOTAL: 502 mcg/24 h (ref 224–832)
NORMETANEPHRINE: 427 mcg/24 h (ref 122–676)
Total Volume: 3050 mL

## 2021-09-19 LAB — CATECHOLAMINES, FRACTIONATED, URINE, 24 HOUR
Calc Total (E+NE): 48 mcg/24 h (ref 26–121)
Creatinine, Urine mg/day-CATEUR: 1.42 g/(24.h) (ref 0.50–2.15)
Dopamine 24 Hr Urine: 253 mcg/24 h (ref 52–480)
Norepinephrine, 24H, Ur: 48 mcg/24 h (ref 15–100)
Total Volume: 3050 mL

## 2021-09-25 ENCOUNTER — Encounter: Payer: Self-pay | Admitting: Internal Medicine

## 2021-10-11 ENCOUNTER — Ambulatory Visit (INDEPENDENT_AMBULATORY_CARE_PROVIDER_SITE_OTHER): Payer: Medicare HMO | Admitting: Internal Medicine

## 2021-10-11 ENCOUNTER — Encounter: Payer: Self-pay | Admitting: Internal Medicine

## 2021-10-11 VITALS — BP 144/78 | HR 90 | Temp 98.2°F | Resp 16 | Ht 71.0 in | Wt 216.0 lb

## 2021-10-11 DIAGNOSIS — E119 Type 2 diabetes mellitus without complications: Secondary | ICD-10-CM

## 2021-10-11 DIAGNOSIS — I10 Essential (primary) hypertension: Secondary | ICD-10-CM | POA: Diagnosis not present

## 2021-10-11 LAB — BASIC METABOLIC PANEL
BUN: 12 mg/dL (ref 6–23)
CO2: 27 mEq/L (ref 19–32)
Calcium: 9.8 mg/dL (ref 8.4–10.5)
Chloride: 103 mEq/L (ref 96–112)
Creatinine, Ser: 0.97 mg/dL (ref 0.40–1.50)
GFR: 79.62 mL/min (ref >60.00)
Glucose, Bld: 102 mg/dL — ABNORMAL HIGH (ref 70–99)
Potassium: 4.3 mEq/L (ref 3.5–5.1)
Sodium: 137 mEq/L (ref 135–145)

## 2021-10-11 NOTE — Patient Instructions (Signed)
Hypertension, Adult High blood pressure (hypertension) is when the force of blood pumping through the arteries is too strong. The arteries are the blood vessels that carry blood from the heart throughout the body. Hypertension forces the heart to work harder to pump blood and may cause arteries to become narrow or stiff. Untreated or uncontrolled hypertension can lead to a heart attack, heart failure, a stroke, kidney disease, and other problems. A blood pressure reading consists of a higher number over a lower number. Ideally, your blood pressure should be below 120/80. The first ("top") number is called the systolic pressure. It is a measure of the pressure in your arteries as your heart beats. The second ("bottom") number is called the diastolic pressure. It is a measure of the pressure in your arteries as the heart relaxes. What are the causes? The exact cause of this condition is not known. There are some conditions that result in high blood pressure. What increases the risk? Certain factors may make you more likely to develop high blood pressure. Some of these risk factors are under your control, including: Smoking. Not getting enough exercise or physical activity. Being overweight. Having too much fat, sugar, calories, or salt (sodium) in your diet. Drinking too much alcohol. Other risk factors include: Having a personal history of heart disease, diabetes, high cholesterol, or kidney disease. Stress. Having a family history of high blood pressure and high cholesterol. Having obstructive sleep apnea. Age. The risk increases with age. What are the signs or symptoms? High blood pressure may not cause symptoms. Very high blood pressure (hypertensive crisis) may cause: Headache. Fast or irregular heartbeats (palpitations). Shortness of breath. Nosebleed. Nausea and vomiting. Vision changes. Severe chest pain, dizziness, and seizures. How is this diagnosed? This condition is diagnosed by  measuring your blood pressure while you are seated, with your arm resting on a flat surface, your legs uncrossed, and your feet flat on the floor. The cuff of the blood pressure monitor will be placed directly against the skin of your upper arm at the level of your heart. Blood pressure should be measured at least twice using the same arm. Certain conditions can cause a difference in blood pressure between your right and left arms. If you have a high blood pressure reading during one visit or you have normal blood pressure with other risk factors, you may be asked to: Return on a different day to have your blood pressure checked again. Monitor your blood pressure at home for 1 week or longer. If you are diagnosed with hypertension, you may have other blood or imaging tests to help your health care provider understand your overall risk for other conditions. How is this treated? This condition is treated by making healthy lifestyle changes, such as eating healthy foods, exercising more, and reducing your alcohol intake. You may be referred for counseling on a healthy diet and physical activity. Your health care provider may prescribe medicine if lifestyle changes are not enough to get your blood pressure under control and if: Your systolic blood pressure is above 130. Your diastolic blood pressure is above 80. Your personal target blood pressure may vary depending on your medical conditions, your age, and other factors. Follow these instructions at home: Eating and drinking  Eat a diet that is high in fiber and potassium, and low in sodium, added sugar, and fat. An example of this eating plan is called the DASH diet. DASH stands for Dietary Approaches to Stop Hypertension. To eat this way: Eat   plenty of fresh fruits and vegetables. Try to fill one half of your plate at each meal with fruits and vegetables. Eat whole grains, such as whole-wheat pasta, brown rice, or whole-grain bread. Fill about one  fourth of your plate with whole grains. Eat or drink low-fat dairy products, such as skim milk or low-fat yogurt. Avoid fatty cuts of meat, processed or cured meats, and poultry with skin. Fill about one fourth of your plate with lean proteins, such as fish, chicken without skin, beans, eggs, or tofu. Avoid pre-made and processed foods. These tend to be higher in sodium, added sugar, and fat. Reduce your daily sodium intake. Many people with hypertension should eat less than 1,500 mg of sodium a day. Do not drink alcohol if: Your health care provider tells you not to drink. You are pregnant, may be pregnant, or are planning to become pregnant. If you drink alcohol: Limit how much you have to: 0-1 drink a day for women. 0-2 drinks a day for men. Know how much alcohol is in your drink. In the [place] one drink equals one 12 oz bottle of beer (355 mL), one 5 oz glass of wine (148 mL), or one 1 oz glass of hard liquor (44 mL). Lifestyle  Work with your health care provider to maintain a healthy body weight or to lose weight. Ask what an ideal weight is for you. Get at least 30 minutes of exercise that causes your heart to beat faster (aerobic exercise) most days of the week. Activities may include walking, swimming, or biking. Include exercise to strengthen your muscles (resistance exercise), such as Pilates or lifting weights, as part of your weekly exercise routine. Try to do these types of exercises for 30 minutes at least 3 days a week. Do not use any products that contain nicotine or tobacco. These products include cigarettes, chewing tobacco, and vaping devices, such as e-cigarettes. If you need help quitting, ask your health care provider. Monitor your blood pressure at home as told by your health care provider. Keep all follow-up visits. This is important. Medicines Take over-the-counter and prescription medicines only as told by your health care provider. Follow directions carefully. Blood  pressure medicines must be taken as prescribed. Do not skip doses of blood pressure medicine. Doing this puts you at risk for problems and can make the medicine less effective. Ask your health care provider about side effects or reactions to medicines that you should watch for. Contact a health care provider if you: Think you are having a reaction to a medicine you are taking. Have headaches that keep coming back (recurring). Feel dizzy. Have swelling in your ankles. Have trouble with your vision. Get help right away if you: Develop a severe headache or confusion. Have unusual weakness or numbness. Feel faint. Have severe pain in your chest or abdomen. Vomit repeatedly. Have trouble breathing. These symptoms may be an emergency. Get help right away. Call 911. Do not wait to see if the symptoms will go away. Do not drive yourself to the hospital. Summary Hypertension is when the force of blood pumping through your arteries is too strong. If this condition is not controlled, it may put you at risk for serious complications. Your personal target blood pressure may vary depending on your medical conditions, your age, and other factors. For most people, a normal blood pressure is less than 120/80. Hypertension is treated with lifestyle changes, medicines, or a combination of both. Lifestyle changes include losing weight, eating a healthy,   low-sodium diet, exercising more, and limiting alcohol. This information is not intended to replace advice given to you by your health care provider. Make sure you discuss any questions you have with your health care provider. Document Revised: 11/14/2020 Document Reviewed: 11/14/2020 Elsevier Patient Education  2023 Else[employer] Inc[employer]  

## 2021-10-11 NOTE — Progress Notes (Signed)
Subjective:  Patient ID: Allen Whitehead, male    DOB: [birth year]  Age: 71 y.o. MRN: [id number]  CC: Hypertension and Diabetes   HPI RAVEN HARMES presents for f/up -  He is active and denies chest pain, shortness of breath, diaphoresis, edema, or polys.  He is not willing to get a flu vaccine, pneumonia vaccine, shingles vaccine, or tetanus vaccine.  Outpatient Medications Prior to Visit  Medication Sig Dispense Refill   aspirin 81 MG EC tablet Take 1 tablet (81 mg total) by mouth daily. Swallow whole. 30 tablet 11   metFORMIN (GLUCOPHAGE) 500 MG tablet TAKE 1 TABLET BY MOUTH EVERY DAY WITH BREAKFAST 90 tablet 1   nicotine (NICODERM CQ - DOSED IN MG/24 HOURS) 21 mg/24hr patch Place 1 patch (21 mg total) onto the skin daily. 28 patch 0   nitroGLYCERIN (NITROSTAT) 0.4 MG SL tablet Place 1 tablet (0.4 mg total) under the tongue every 5 (five) minutes x 3 doses as needed for chest pain. 25 tablet 3   olmesartan (BENICAR) 40 MG tablet Take 1 tablet (40 mg total) by mouth daily. 90 tablet 1   rosuvastatin (CRESTOR) 40 MG tablet Take 1 tablet (40 mg total) by mouth daily. 90 tablet 3   ticagrelor (BRILINTA) 90 MG TABS tablet Take 1 tablet (90 mg total) by mouth 2 (two) times daily. 180 tablet 3   No facility-administered medications prior to visit.    ROS Review of Systems  Constitutional:  Negative for chills, diaphoresis and fatigue.  HENT: Negative.    Eyes: Negative.   Respiratory:  Negative for cough, chest tightness, shortness of breath and wheezing.   Cardiovascular:  Negative for chest pain, palpitations and leg swelling.  Gastrointestinal:  Negative for abdominal pain, diarrhea, nausea and vomiting.  Endocrine: Negative.   Genitourinary: Negative.  Negative for difficulty urinating and dysuria.  Musculoskeletal: Negative.  Negative for arthralgias.  Skin: Negative.   Neurological:  Negative for dizziness, weakness and headaches.  Hematological:  Negative for adenopathy.  Does not bruise/bleed easily.  Psychiatric/Behavioral: Negative.      Objective:  BP (!) 144/78 (BP Location: Left Arm, Patient Position: Sitting, Cuff Size: Normal)   Pulse 90   Temp 98.2 F (36.8 C) (Oral)   Resp 16   Ht '5\' 11"'$  (1.803 m)   Wt 216 lb (98 kg)   SpO2 98%   BMI 30.13 kg/m   BP Readings from Last 3 Encounters:  10/11/21 (!) 144/78  09/11/21 120/84  09/04/21 132/80    Wt Readings from Last 3 Encounters:  10/11/21 216 lb (98 kg)  09/11/21 219 lb (99.3 kg)  09/04/21 218 lb (98.9 kg)    Physical Exam Vitals reviewed.  HENT:     Nose: Nose normal.     Mouth/Throat:     Mouth: Mucous membranes are moist.  Eyes:     General: No scleral icterus.    Conjunctiva/sclera: Conjunctivae normal.  Cardiovascular:     Rate and Rhythm: Normal rate and regular rhythm.     Heart sounds: No murmur heard. Pulmonary:     Effort: Pulmonary effort is normal.     Breath sounds: No stridor. No wheezing, rhonchi or rales.  Abdominal:     General: Abdomen is flat.     Palpations: There is no mass.     Tenderness: There is no abdominal tenderness. There is no guarding.     Hernia: No hernia is present.  Musculoskeletal:  General: Normal range of motion.     Cervical back: Neck supple.     Right lower leg: No edema.     Left lower leg: No edema.  Lymphadenopathy:     Cervical: No cervical adenopathy.  Skin:    General: Skin is warm and dry.  Neurological:     General: No focal deficit present.     Mental Status: He is alert.     Lab Results  Component Value Date   WBC 8.6 07/17/2021   HGB 13.4 07/17/2021   HCT 40.6 07/17/2021   PLT 242 07/17/2021   GLUCOSE 102 (H) 10/11/2021   CHOL 99 (L) 07/17/2021   TRIG 70 07/17/2021   HDL 42 07/17/2021   LDLCALC 42 07/17/2021   ALT 22 04/23/2021   AST 23 04/23/2021   NA 137 10/11/2021   K 4.3 10/11/2021   CL 103 10/11/2021   CREATININE 0.97 10/11/2021   BUN 12 10/11/2021   CO2 27 10/11/2021   TSH 2.580  07/17/2021   PSA 0.38 01/03/2021   HGBA1C 6.9 (H) 10/11/2021   MICROALBUR 3.3 (H) 01/03/2021    CARDIAC CATHETERIZATION  Result Date: 04/23/2021   Prox LAD lesion is 80% stenosed.   A drug-eluting stent was successfully placed using a STENT ONYX FRONTIER 3.5X18, postdilated to 3.8 mm and optimized with intravascular ultrasound.   Mid Cx lesion is 50% stenosed.  Cross-sectional area 4.57 mm by intravascular ultrasound.   Post intervention, there is a 0% residual stenosis.   The left ventricular systolic function is normal.   LV end diastolic pressure is normal.   The left ventricular ejection fraction is 55-65% by visual estimate.   There is no aortic valve stenosis. Severe LAD disease.  This was successfully treated with a drug-eluting stent as noted above.  Continue dual antiplatelet therapy for 12 months.  Consider clopidogrel monotherapy after 12 months. Moderate circumflex disease.  Adequate cross-sectional area. Continue aggressive secondary prevention.   ECHOCARDIOGRAM COMPLETE  Result Date: 04/23/2021    ECHOCARDIOGRAM REPORT   Patient Name:   Allen Whitehead Date of Exam: 04/23/2021 Medical Rec #:  [id number]        Height:       71.0 in Accession #:    [id number]       Weight:       209.9 lb Date of Birth:  01-27-1950        BSA:          2.152 m Patient Age:    77 years         BP:           110/66 mmHg Patient Gender: M                HR:           66 bpm. Exam Location:  Inpatient Procedure: 2D Echo, 3D Echo, Cardiac Doppler, Color Doppler and Intracardiac            Opacification Agent Indications:    122-I22.9 Subsequent ST elevation (STEM) and non-ST elevation                 (NSTEMI) myocardial infarction  History:        Patient has prior history of Echocardiogram examinations, most                 recent 11/18/2013. Signs/Symptoms:Dizziness/Lightheadedness;                 Risk Factors:Hypertension,  Diabetes, Dyslipidemia and Current                 Smoker.  Sonographer:    Roseanna Rainbow RDCS  Referring Phys: 34 Crosby Comments: Technically difficult study due to poor echo windows. IMPRESSIONS  1. Left ventricular ejection fraction, by estimation, is 60 to 65%. The left ventricle has normal function. The left ventricle has no regional wall motion abnormalities. There is mild left ventricular hypertrophy. Left ventricular diastolic parameters were normal.  2. Right ventricular systolic function is normal. The right ventricular size is normal.  3. The mitral valve is normal in structure. No evidence of mitral valve regurgitation. No evidence of mitral stenosis.  4. The aortic valve is tricuspid. Aortic valve regurgitation is not visualized. Aortic valve sclerosis is present, with no evidence of aortic valve stenosis.  5. Aortic dilatation noted. There is borderline dilatation of the aortic root, measuring 38 mm. There is mild dilatation of the ascending aorta, measuring 42 mm.  6. The inferior vena cava is normal in size with greater than 50% respiratory variability, suggesting right atrial pressure of 3 mmHg. Comparison(s): Prior images unable to be directly viewed. FINDINGS  Left Ventricle: Left ventricular ejection fraction, by estimation, is 60 to 65%. The left ventricle has normal function. The left ventricle has no regional wall motion abnormalities. Definity contrast agent was given IV to delineate the left ventricular  endocardial borders. The left ventricular internal cavity size was normal in size. There is mild left ventricular hypertrophy. Left ventricular diastolic parameters were normal. Right Ventricle: The right ventricular size is normal. Right ventricular systolic function is normal. Left Atrium: Left atrial size was normal in size. Right Atrium: Right atrial size was normal in size. Pericardium: There is no evidence of pericardial effusion. Mitral Valve: The mitral valve is normal in structure. No evidence of mitral valve regurgitation. No evidence of mitral valve  stenosis. Tricuspid Valve: The tricuspid valve is normal in structure. Tricuspid valve regurgitation is trivial. No evidence of tricuspid stenosis. Aortic Valve: The aortic valve is tricuspid. Aortic valve regurgitation is not visualized. Aortic valve sclerosis is present, with no evidence of aortic valve stenosis. Pulmonic Valve: The pulmonic valve was not well visualized. Pulmonic valve regurgitation is not visualized. No evidence of pulmonic stenosis. Aorta: Aortic dilatation noted. There is borderline dilatation of the aortic root, measuring 38 mm. There is mild dilatation of the ascending aorta, measuring 42 mm. Venous: The inferior vena cava is normal in size with greater than 50% respiratory variability, suggesting right atrial pressure of 3 mmHg. IAS/Shunts: No atrial level shunt detected by color flow Doppler.  LEFT VENTRICLE PLAX 2D LVIDd:         3.60 cm     Diastology LVIDs:         2.45 cm     LV e' medial:    8.27 cm/s LV PW:         1.40 cm     LV E/e' medial:  10.0 LV IVS:        1.00 cm     LV e' lateral:   8.81 cm/s LVOT diam:     2.40 cm     LV E/e' lateral: 9.3 LV SV:         82 LV SV Index:   38 LVOT Area:     4.52 cm  LV Volumes (MOD) LV vol d, MOD A2C: 87.8 ml LV vol d, MOD A4C: 70.4 ml LV  vol s, MOD A2C: 27.6 ml LV vol s, MOD A4C: 29.3 ml LV SV MOD A2C:     60.2 ml LV SV MOD A4C:     70.4 ml LV SV MOD BP:      51.4 ml RIGHT VENTRICLE             IVC RV S prime:     14.00 cm/s  IVC diam: 2.10 cm TAPSE (M-mode): 2.3 cm LEFT ATRIUM             Index        RIGHT ATRIUM           Index LA diam:        3.50 cm 1.63 cm/m   RA Area:     11.20 cm LA Vol (A2C):   32.7 ml 15.19 ml/m  RA Volume:   22.40 ml  10.41 ml/m LA Vol (A4C):   26.1 ml 12.13 ml/m LA Biplane Vol: 31.4 ml 14.59 ml/m  AORTIC VALVE LVOT Vmax:   98.30 cm/s LVOT Vmean:  64.350 cm/s LVOT VTI:    0.182 m  AORTA Ao Root diam: 3.80 cm Ao Asc diam:  4.15 cm MITRAL VALVE MV Area (PHT): 3.72 cm    SHUNTS MV Decel Time: 204 msec     Systemic VTI:  0.18 m MV E velocity: 82.30 cm/s  Systemic Diam: 2.40 cm MV A velocity: 81.80 cm/s MV E/A ratio:  1.01 Kirk Ruths MD Electronically signed by Kirk Ruths MD Signature Date/Time: 04/23/2021/9:50:51 AM    Final    DG Chest 2 View  Result Date: 04/22/2021 CLINICAL DATA:  Chest pain EXAM: CHEST - 2 VIEW COMPARISON:  CT 03/22/2021 FINDINGS: The heart size and mediastinal contours are within normal limits. Both lungs are clear. The visualized skeletal structures are unremarkable. IMPRESSION: No active cardiopulmonary disease. Electronically Signed   By: Kerby Moors M.D.   On: 04/22/2021 10:13    Assessment & Plan:   Greycen was seen today for hypertension and diabetes.  Diagnoses and all orders for this visit:  Malignant hypertension- His blood pressure has improved. -     Cancel: Basic metabolic panel; Future -     Basic metabolic panel; Future -     Basic metabolic panel  Diabetes mellitus without complication ([place])- His blood sugar is adequately well controlled. -     Cancel: Basic metabolic panel; Future -     Cancel: Hemoglobin A1c; Future -     Basic metabolic panel; Future -     Hemoglobin A1c; Future -     HM Diabetes Foot Exam -     Ambulatory referral to Ophthalmology -     Hemoglobin A1c -     Basic metabolic panel   I am having Allen Whitehead maintain his aspirin EC, nitroGLYCERIN, rosuvastatin, nicotine, ticagrelor, metFORMIN, and olmesartan.  No orders of the defined types were placed in this encounter.    Follow-up: Return in about 4 months (around 02/10/2022).  Scarlette Calico, MD

## 2021-10-12 ENCOUNTER — Encounter: Payer: Self-pay | Admitting: Internal Medicine

## 2021-10-12 LAB — HEMOGLOBIN A1C
Hgb A1c MFr Bld: 6.9 % of total Hgb — ABNORMAL HIGH (ref <5.7)
Mean Plasma Glucose: 151 mg/dL
eAG (mmol/L): 8.4 mmol/L

## 2021-11-06 ENCOUNTER — Other Ambulatory Visit (HOSPITAL_COMMUNITY): Payer: Self-pay

## 2021-11-09 ENCOUNTER — Other Ambulatory Visit: Payer: Self-pay | Admitting: Internal Medicine

## 2021-11-09 DIAGNOSIS — E119 Type 2 diabetes mellitus without complications: Secondary | ICD-10-CM

## 2021-12-04 ENCOUNTER — Telehealth: Payer: Self-pay | Admitting: Internal Medicine

## 2021-12-04 NOTE — Telephone Encounter (Signed)
Left message for patient to call back and schedule Medicare Annual Wellness Visit (AWV).   Please offer to do virtually or by telephone.   Last AWV: due as of 02/21/18 for awvi per [facility]   Please schedule at anytime with [facility] or with Nurse 2 schedule   45 minute appointent  If any questions, please contact me at [phone]

## 2021-12-10 ENCOUNTER — Other Ambulatory Visit (HOSPITAL_COMMUNITY): Payer: Self-pay

## 2021-12-11 ENCOUNTER — Ambulatory Visit (INDEPENDENT_AMBULATORY_CARE_PROVIDER_SITE_OTHER): Payer: Medicare HMO

## 2021-12-11 VITALS — Ht 71.0 in | Wt 216.0 lb

## 2021-12-11 DIAGNOSIS — Z0000 Encounter for general adult medical examination without abnormal findings: Secondary | ICD-10-CM

## 2021-12-11 NOTE — Patient Instructions (Signed)
Mr. Monnig , Thank you for taking time to come for your Medicare Wellness Visit. I appreciate your ongoing commitment to your health goals. Please review the following plan we discussed and let me know if I can assist you in the future.   These are the goals we discussed:  Goals      To maintain my health after having a heart attack back in April 2023.        This is a list of the screening recommended for you and due dates:  Health Maintenance  Topic Date Due   Pneumonia Vaccine (1 - PCV) Never done   Zoster (Shingles) Vaccine (1 of 2) Never done   Eye exam for diabetics  10/03/2021   Yearly kidney health urinalysis for diabetes  01/03/2022   Screening for Lung Cancer  03/23/2022   Hemoglobin A1C  04/11/2022   Yearly kidney function blood test for diabetes  10/12/2022   Complete foot exam   10/12/2022   Medicare Annual Wellness Visit  12/12/2022   Colon Cancer Screening  02/14/2030   Hepatitis C Screening: USPSTF Recommendation to screen - Ages 18-69 yo.  Completed   HPV Vaccine  Aged Out   Flu Shot  Discontinued   COVID-19 Vaccine  Discontinued    Advanced directives: No  Conditions/risks identified: Yes; Type II Diabetes  Next appointment: Follow up in one year for your annual wellness visit.   Preventive Care 79 Years and Older, Male  Preventive care refers to lifestyle choices and visits with your health care provider that can promote health and wellness. What does preventive care include? A yearly physical exam. This is also called an annual well check. Dental exams once or twice a year. Routine eye exams. Ask your health care provider how often you should have your eyes checked. Personal lifestyle choices, including: Daily care of your teeth and gums. Regular physical activity. Eating a healthy diet. Avoiding tobacco and drug use. Limiting alcohol use. Practicing safe sex. Taking low doses of aspirin every day. Taking vitamin and mineral supplements as  recommended by your health care provider. What happens during an annual well check? The services and screenings done by your health care provider during your annual well check will depend on your age, overall health, lifestyle risk factors, and family history of disease. Counseling  Your health care provider may ask you questions about your: Alcohol use. Tobacco use. Drug use. Emotional well-being. Home and relationship well-being. Sexual activity. Eating habits. History of falls. Memory and ability to understand (cognition). Work and work Statistician. Screening  You may have the following tests or measurements: Height, weight, and BMI. Blood pressure. Lipid and cholesterol levels. These may be checked every 5 years, or more frequently if you are over 8 years old. Skin check. Lung cancer screening. You may have this screening every year starting at age 19 if you have a 30-pack-year history of smoking and currently smoke or have quit within the past 15 years. Fecal occult blood test (FOBT) of the stool. You may have this test every year starting at age 6. Flexible sigmoidoscopy or colonoscopy. You may have a sigmoidoscopy every 5 years or a colonoscopy every 10 years starting at age 97. Prostate cancer screening. Recommendations will vary depending on your family history and other risks. Hepatitis C blood test. Hepatitis B blood test. Sexually transmitted disease (STD) testing. Diabetes screening. This is done by checking your blood sugar (glucose) after you have not eaten for a while (fasting). You  may have this done every 1-3 years. Abdominal aortic aneurysm (AAA) screening. You may need this if you are a current or former smoker. Osteoporosis. You may be screened starting at age 75 if you are at high risk. Talk with your health care provider about your test results, treatment options, and if necessary, the need for more tests. Vaccines  Your health care provider may recommend  certain vaccines, such as: Influenza vaccine. This is recommended every year. Tetanus, diphtheria, and acellular pertussis (Tdap, Td) vaccine. You may need a Td booster every 10 years. Zoster vaccine. You may need this after age 74. Pneumococcal 13-valent conjugate (PCV13) vaccine. One dose is recommended after age 74. Pneumococcal polysaccharide (PPSV23) vaccine. One dose is recommended after age 34. Talk to your health care provider about which screenings and vaccines you need and how often you need them. This information is not intended to replace advice given to you by your health care provider. Make sure you discuss any questions you have with your health care provider. Document Released: 02/03/2015 Document Revised: 09/27/2015 Document Reviewed: 11/08/2014 Elsevier Interactive Patient Education  2017 [employer] Prevention in the Home Falls can cause injuries. They can happen to people of all ages. There are many things you can do to make your home safe and to help prevent falls. What can I do on the outside of my home? Regularly fix the edges of walkways and driveways and fix any cracks. Remove anything that might make you trip as you walk through a door, such as a raised step or threshold. Trim any bushes or trees on the path to your home. Use bright outdoor lighting. Clear any walking paths of anything that might make someone trip, such as rocks or tools. Regularly check to see if handrails are loose or broken. Make sure that both sides of any steps have handrails. Any raised decks and porches should have guardrails on the edges. Have any leaves, snow, or ice cleared regularly. Use sand or salt on walking paths during winter. Clean up any spills in your garage right away. This includes oil or grease spills. What can I do in the bathroom? Use night lights. Install grab bars by the toilet and in the tub and shower. Do not use towel bars as grab bars. Use non-skid mats or  decals in the tub or shower. If you need to sit down in the shower, use a plastic, non-slip stool. Keep the floor dry. Clean up any water that spills on the floor as soon as it happens. Remove soap buildup in the tub or shower regularly. Attach bath mats securely with double-sided non-slip rug tape. Do not have throw rugs and other things on the floor that can make you trip. What can I do in the bedroom? Use night lights. Make sure that you have a light by your bed that is easy to reach. Do not use any sheets or blankets that are too big for your bed. They should not hang down onto the floor. Have a firm chair that has side arms. You can use this for support while you get dressed. Do not have throw rugs and other things on the floor that can make you trip. What can I do in the kitchen? Clean up any spills right away. Avoid walking on wet floors. Keep items that you use a lot in easy-to-reach places. If you need to reach something above you, use a strong step stool that has a grab bar. Keep electrical  cords out of the way. Do not use floor polish or wax that makes floors slippery. If you must use wax, use non-skid floor wax. Do not have throw rugs and other things on the floor that can make you trip. What can I do with my stairs? Do not leave any items on the stairs. Make sure that there are handrails on both sides of the stairs and use them. Fix handrails that are broken or loose. Make sure that handrails are as long as the stairways. Check any carpeting to make sure that it is firmly attached to the stairs. Fix any carpet that is loose or worn. Avoid having throw rugs at the top or bottom of the stairs. If you do have throw rugs, attach them to the floor with carpet tape. Make sure that you have a light switch at the top of the stairs and the bottom of the stairs. If you do not have them, ask someone to add them for you. What else can I do to help prevent falls? Wear shoes that: Do not  have high heels. Have rubber bottoms. Are comfortable and fit you well. Are closed at the toe. Do not wear sandals. If you use a stepladder: Make sure that it is fully opened. Do not climb a closed stepladder. Make sure that both sides of the stepladder are locked into place. Ask someone to hold it for you, if possible. Clearly mark and make sure that you can see: Any grab bars or handrails. First and last steps. Where the edge of each step is. Use tools that help you move around (mobility aids) if they are needed. These include: Canes. Walkers. Scooters. Crutches. Turn on the lights when you go into a dark area. Replace any light bulbs as soon as they burn out. Set up your furniture so you have a clear path. Avoid moving your furniture around. If any of your floors are uneven, fix them. If there are any pets around you, be aware of where they are. Review your medicines with your doctor. Some medicines can make you feel dizzy. This can increase your chance of falling. Ask your doctor what other things that you can do to help prevent falls. This information is not intended to replace advice given to you by your health care provider. Make sure you discuss any questions you have with your health care provider. Document Released: 11/03/2008 Document Revised: 06/15/2015 Document Reviewed: 02/11/2014 Elsevier Interactive Patient Education  2017 [employer].

## 2021-12-11 NOTE — Progress Notes (Signed)
Virtual Visit via Telephone Note  I connected with  Allen Whitehead on 12/11/21 at  3:15 PM EST by telephone and verified that I am speaking with the correct person using two identifiers.  Location: Patient: HOME Provider: [facility] Persons participating in the virtual visit: patient/Nurse Health Advisor   I discussed the limitations, risks, security and privacy concerns of performing an evaluation and management service by telephone and the availability of in person appointments. The patient expressed understanding and agreed to proceed.  Interactive audio and video telecommunications were attempted between this nurse and patient, however failed, due to patient having technical difficulties OR patient did not have access to video capability.  We continued and completed visit with audio only.  Some vital signs may be absent or patient reported.   Sheral Flow, LPN  Subjective:   Allen Whitehead is a 71 y.o. male who presents for an Initial Medicare Annual Wellness Visit.  Review of Systems     Cardiac Risk Factors include: advanced age (>60mn, >>35women);diabetes mellitus;dyslipidemia;family history of premature cardiovascular disease;hypertension;male gender;obesity (BMI >30kg/m2);smoking/ tobacco exposure     Objective:    Today's Vitals   12/11/21 1533  Weight: 216 lb (98 kg)  Height: '5\' 11"'$  (1.803 m)  PainSc: 0-No pain   Body mass index is 30.13 kg/m.     12/11/2021    3:42 PM 04/22/2021    8:35 AM 02/15/2020    8:13 AM 10/12/2019   10:08 AM 11/09/2013    2:24 PM  Advanced Directives  Does Patient Have a Medical Advance Directive? No No No No No  Would patient like information on creating a medical advance directive? No - Patient declined No - Patient declined No - Patient declined No - Patient declined Yes - Educational materials given    Current Medications (verified) Outpatient Encounter Medications as of 12/11/2021  Medication Sig   aspirin 81  MG EC tablet Take 1 tablet (81 mg total) by mouth daily. Swallow whole.   metFORMIN (GLUCOPHAGE) 500 MG tablet TAKE 1 TABLET BY MOUTH EVERY DAY WITH BREAKFAST   nicotine (NICODERM CQ - DOSED IN MG/24 HOURS) 21 mg/24hr patch Place 1 patch (21 mg total) onto the skin daily.   nitroGLYCERIN (NITROSTAT) 0.4 MG SL tablet Place 1 tablet (0.4 mg total) under the tongue every 5 (five) minutes x 3 doses as needed for chest pain.   olmesartan (BENICAR) 40 MG tablet Take 1 tablet (40 mg total) by mouth daily.   rosuvastatin (CRESTOR) 40 MG tablet Take 1 tablet (40 mg total) by mouth daily.   ticagrelor (BRILINTA) 90 MG TABS tablet Take 1 tablet (90 mg total) by mouth 2 (two) times daily.   No facility-administered encounter medications on file as of 12/11/2021.    Allergies (verified) Patient has no known allergies.   History: Past Medical History:  Diagnosis Date   Anxiety    Arthritis    CAD (coronary artery disease), native coronary artery    04/23/21: 80% pLAD-DES, 50% Cx treated medically   Depression    Diabetes mellitus without complication (HCC)    Elevated plasma metanephrines    GERD (gastroesophageal reflux disease)    past hx    Hyperlipidemia with target LDL less than 70    Hypertension    Kidney stones    Past Surgical History:  Procedure Laterality Date   BIOPSY  02/15/2020   Procedure: BIOPSY;  Surgeon: NMauri Pole MD;  Location: [facility];  Service: Endoscopy;;  COLONOSCOPY     age 89 for blood in stool- Normal    COLONOSCOPY WITH PROPOFOL N/A 02/15/2020   Procedure: COLONOSCOPY WITH PROPOFOL;  Surgeon: [name], MD;  Location: [facility];  Service: Endoscopy;  Laterality: N/A;   CORONARY STENT INTERVENTION N/A 04/23/2021   Procedure: CORONARY STENT INTERVENTION;  Surgeon: [name], MD;  Location: [facility];  Service: Cardiovascular;  Laterality: N/A;   ENDOSCOPIC MUCOSAL RESECTION  02/15/2020   Procedure: ENDOSCOPIC MUCOSAL  RESECTION;  Surgeon: [name], MD;  Location: [facility];  Service: Endoscopy;;   kidney stone retrieval     lasar treatment on [place]     LEFT HEART CATH AND CORONARY ANGIOGRAPHY N/A 04/23/2021   Procedure: LEFT HEART CATH AND CORONARY ANGIOGRAPHY;  Surgeon: [name], MD;  Location: [facility];  Service: Cardiovascular;  Laterality: N/A;   POLYPECTOMY  02/15/2020   Procedure: POLYPECTOMY;  Surgeon: [name], MD;  Location: [facility];  Service: Endoscopy;;   SUBMUCOSAL LIFTING INJECTION  02/15/2020   Procedure: SUBMUCOSAL LIFTING INJECTION;  Surgeon: [name], MD;  Location: [facility];  Service: Endoscopy;;   VASECTOMY     Family History  Problem Relation Age of Onset   Cancer Mother        unsure type    Hypertension Mother    Alzheimer's disease Mother    Cancer Father    Diabetes Father    Lung cancer Father        + smoker    Lung cancer Brother    Colon cancer Other    Colon polyps Neg Hx    Esophageal cancer Neg Hx    Stomach cancer Neg Hx    Rectal cancer Neg Hx    Social History   Socioeconomic History   Marital status: Legally Separated    Spouse name: Not on file   Number of children: Not on file   Years of education: Not on file   Highest education level: Not on file  Occupational History   Not on file  Tobacco Use   Smoking status: Former    Packs/day: 1.00    Years: 30.00    Total pack years: 30.00    Types: Cigarettes    Quit date: 04/24/2021    Years since quitting: 0.6    Passive exposure: Past   Smokeless tobacco: Never  Vaping Use   Vaping Use: Never used  Substance and Sexual Activity   Alcohol use: Yes    Alcohol/week: 2.0 standard drinks of alcohol    Types: 2 Cans of beer per week    Comment: occasionally   Drug use: No   Sexual activity: Yes    Partners: Female  Other Topics Concern   Not on file  Social History Narrative   Not on file   Social Determinants of Health    Financial Resource Strain: Low Risk  (12/11/2021)   Overall Financial Resource Strain (CARDIA)    Difficulty of Paying Living Expenses: Not hard at all  Food Insecurity: No Food Insecurity (12/11/2021)   Hunger Vital Sign    Worried About Running Out of Food in the Last Year: Never true    [place] in the Last Year: Never true  Transportation Needs: No Transportation Needs (12/11/2021)   PRAPARE - Hydrologist (Medical): No    Lack of Transportation (Non-Medical): No  Physical Activity: Sufficiently Active (12/11/2021)  Exercise Vital Sign    Days of Exercise per Week: 7 days    Minutes of Exercise per Session: 60 min  Stress: No Stress Concern Present (12/11/2021)   [place]    Feeling of Stress : Not at all  Social Connections: Unknown (12/11/2021)   Social Connection and Isolation Panel [NHANES]    Frequency of Communication with Friends and Family: Patient refused    Frequency of Social Gatherings with Friends and Family: Patient refused    Attends Religious Services: Patient refused    Marine scientist or Organizations: Patient refused    Attends Music therapist: Patient refused    Marital Status: Patient refused    Tobacco Counseling Counseling given: Not Answered   Clinical Intake:  Pre-visit preparation completed: Yes  Pain : No/denies pain Pain Score: 0-No pain     BMI - recorded: 30.13 Nutritional Status: BMI > 30  Obese Nutritional Risks: None Diabetes: No  How often do you need to have someone help you when you read instructions, pamphlets, or other written materials from your doctor or pharmacy?: 1 - Never What is the last grade level you completed in school?: HSG  PREDIABETIC?: YES  Interpreter Needed?: No  Information entered by :: Lisette Abu, LPN.   Activities of Daily Living    12/11/2021    3:44 PM 01/03/2021     1:26 PM  In your present state of health, do you have any difficulty performing the following activities:  Hearing? 0 0  Vision? 0 0  Difficulty concentrating or making decisions? 0 0  Walking or climbing stairs? 0 0  Dressing or bathing? 0 0  Doing errands, shopping? 0 0  Preparing Food and eating ? N   Using the Toilet? N   In the past six months, have you accidently leaked urine? N   Do you have problems with loss of bowel control? N   Managing your Medications? N   Managing your Finances? N   Housekeeping or managing your Housekeeping? N     Patient Care Team: [name], MD as PCP - General (Internal Medicine) [name], MD as PCP - Cardiology (Cardiology)  Indicate any recent Medical Services you may have received from other than [facility] providers in the past year (date may be approximate).     Assessment:   This is a routine wellness examination for Allen Whitehead.  Hearing/Vision screen Hearing Screening - Comments:: Denies hearing difficulties   Vision Screening - Comments:: Wears readers - appointment scheduled for 01/10/2022 with [facility]   Dietary issues and exercise activities discussed: Current Exercise Habits: Home exercise routine, Type of exercise: walking, Time (Minutes): 30, Frequency (Times/Week): 7, Weekly Exercise (Minutes/Week): 210, Intensity: Moderate, Exercise limited by: cardiac condition(s)   Goals Addressed             This Visit's Progress    To maintain my health after having a heart attack back in April 2023.        Depression Screen    12/11/2021    3:25 PM 10/11/2021    1:10 PM 01/03/2021    1:26 PM 11/12/2019    2:08 PM 10/12/2019   10:10 AM 09/15/2019    2:41 PM 08/09/2019    2:51 PM  PHQ 2/9 Scores  PHQ - 2 Score 2 2 0 0 0 0 0  PHQ- 9 Score 3 4  Fall Risk    12/11/2021    3:21 PM 01/03/2021    1:26 PM 11/12/2019    2:08 PM 10/12/2019   10:10 AM 09/15/2019    2:41 PM  [place] in the past year? 0 0 0 0 0  Number falls in past yr: 0  0 0 0  Injury with Fall? 0  0 0 0  Risk for fall due to : No Fall Risks  No Fall Risks    Follow up Falls prevention discussed  Falls evaluation completed Falls evaluation completed;Education provided Falls evaluation completed    [place]:  Any stairs in or around the home? No  If so, are there any without handrails? No  Home free of loose throw rugs in walkways, pet beds, electrical cords, etc? Yes  Adequate lighting in your home to reduce risk of falls? Yes   ASSISTIVE DEVICES UTILIZED TO PREVENT FALLS:  Life alert? No  Use of a cane, walker or w/c? No  Grab bars in the bathroom? No  Shower chair or bench in shower? No  Elevated toilet seat or a handicapped toilet? No   TIMED UP AND GO:  Was the test performed? No . PHONE VISIT   Cognitive Function:        12/11/2021    3:45 PM 10/12/2019   10:08 AM  6CIT Screen  What Year? 0 points 0 points  What month? 0 points 0 points  What time? 0 points 0 points  Count back from 20 0 points 0 points  Months in reverse 0 points 0 points  Repeat phrase 0 points 0 points  Total Score 0 points 0 points    Immunizations Immunization History  Administered Date(s) Administered   PFIZER(Purple Top)SARS-COV-2 Vaccination 03/07/2019, 03/30/2019    TDAP status: Declined, Education has been provided regarding the importance of this vaccine but patient still declined. Advised may receive this vaccine at local pharmacy or Health Dept. Aware to provide a copy of the vaccination record if obtained from local pharmacy or Health Dept. Verbalized acceptance and understanding.  Flu Vaccine status: Declined, Education has been provided regarding the importance of this vaccine but patient still declined. Advised may receive this vaccine at local pharmacy or Health Dept. Aware to provide a copy of the vaccination record if obtained from local  pharmacy or Health Dept. Verbalized acceptance and understanding.  Pneumococcal vaccine status: Declined,  Education has been provided regarding the importance of this vaccine but patient still declined. Advised may receive this vaccine at local pharmacy or Health Dept. Aware to provide a copy of the vaccination record if obtained from local pharmacy or Health Dept. Verbalized acceptance and understanding.   Covid-19 vaccine status: Completed vaccines  Qualifies for Shingles Vaccine? Yes   Zostavax completed No   Shingrix Completed?: No.    Education has been provided regarding the importance of this vaccine. Patient has been advised to call insurance company to determine out of pocket expense if they have not yet received this vaccine. Advised may also receive vaccine at local pharmacy or Health Dept. Verbalized acceptance and understanding.  Screening Tests Health Maintenance  Topic Date Due   Pneumonia Vaccine 36+ Years old (1 - PCV) Never done   Zoster Vaccines- Shingrix (1 of 2) Never done   OPHTHALMOLOGY EXAM  10/03/2021   Diabetic kidney evaluation - Urine ACR  01/03/2022   Lung Cancer Screening  03/23/2022   HEMOGLOBIN A1C  04/11/2022   Diabetic kidney evaluation - GFR measurement  10/12/2022   FOOT EXAM  10/12/2022   Medicare Annual Wellness (AWV)  12/12/2022   COLONOSCOPY (Pts 45-32yr Insurance coverage will need to be confirmed)  02/14/2030   Hepatitis C Screening  Completed   HPV VACCINES  Aged Out   INFLUENZA VACCINE  Discontinued   COVID-19 Vaccine  Discontinued    Health Maintenance  Health Maintenance Due  Topic Date Due   Pneumonia Vaccine 71 Years old (1 - PCV) Never done   Zoster Vaccines- Shingrix (1 of 2) Never done   OPHTHALMOLOGY EXAM  10/03/2021   Diabetic kidney evaluation - Urine ACR  01/03/2022    Colorectal cancer screening: Type of screening: Colonoscopy. Completed 02/15/2020. Repeat every 10 years  Lung Cancer Screening: (Low Dose CT Chest  recommended if Age 69-80years, 30 pack-year currently smoking OR have quit w/in 15years.) does not qualify.   Lung Cancer Screening Referral: no  Additional Screening:  Hepatitis C Screening: does qualify; Completed 01/03/2021  Vision Screening: Recommended annual ophthalmology exams for early detection of glaucoma and other disorders of the eye. Is the patient up to date with their annual eye exam?  Yes  Who is the provider or what is the name of the office in which the patient attends annual [facility] exams[facility] If pt is not established with a provider, would they like to be referred to a provider to establish care? No .   Dental Screening: Recommended annual dental exams for proper oral hygiene  Community Resource Referral / Chronic Care Management: CRR required this visit?  No   CCM required this visit?  No      Plan:     I have personally reviewed and noted the following in the patient's chart:   Medical and social history Use of alcohol, tobacco or illicit drugs  Current medications and supplements including opioid prescriptions. Patient is not currently taking opioid prescriptions. Functional ability and status Nutritional status Physical activity Advanced directives List of other physicians Hospitalizations, surgeries, and ER visits in previous 12 months Vitals Screenings to include cognitive, depression, and falls Referrals and appointments  In addition, I have reviewed and discussed with patient certain preventive protocols, quality metrics, and best practice recommendations. A written personalized care plan for preventive services as well as general preventive health recommendations were provided to patient.     SSheral Flow LPN   [id number]  Nurse Notes: N/A

## 2022-01-01 DIAGNOSIS — E119 Type 2 diabetes mellitus without complications: Secondary | ICD-10-CM | POA: Diagnosis not present

## 2022-01-01 DIAGNOSIS — H5202 Hypermetropia, left eye: Secondary | ICD-10-CM | POA: Diagnosis not present

## 2022-01-01 DIAGNOSIS — H5211 Myopia, right eye: Secondary | ICD-10-CM | POA: Diagnosis not present

## 2022-01-16 ENCOUNTER — Other Ambulatory Visit (HOSPITAL_COMMUNITY): Payer: Self-pay

## 2022-02-06 DIAGNOSIS — B338 Other specified viral diseases: Secondary | ICD-10-CM | POA: Diagnosis not present

## 2022-02-08 ENCOUNTER — Other Ambulatory Visit (HOSPITAL_COMMUNITY): Payer: Self-pay

## 2022-02-11 ENCOUNTER — Encounter: Payer: Self-pay | Admitting: Internal Medicine

## 2022-02-11 ENCOUNTER — Other Ambulatory Visit (HOSPITAL_COMMUNITY): Payer: Self-pay

## 2022-02-11 ENCOUNTER — Ambulatory Visit (INDEPENDENT_AMBULATORY_CARE_PROVIDER_SITE_OTHER): Payer: Medicare HMO | Admitting: Internal Medicine

## 2022-02-11 VITALS — BP 160/86 | HR 89 | Temp 98.2°F | Ht 71.0 in | Wt 220.0 lb

## 2022-02-11 DIAGNOSIS — E119 Type 2 diabetes mellitus without complications: Secondary | ICD-10-CM | POA: Insufficient documentation

## 2022-02-11 DIAGNOSIS — Z0001 Encounter for general adult medical examination with abnormal findings: Secondary | ICD-10-CM

## 2022-02-11 DIAGNOSIS — N400 Enlarged prostate without lower urinary tract symptoms: Secondary | ICD-10-CM | POA: Diagnosis not present

## 2022-02-11 DIAGNOSIS — E1169 Type 2 diabetes mellitus with other specified complication: Secondary | ICD-10-CM

## 2022-02-11 DIAGNOSIS — I10 Essential (primary) hypertension: Secondary | ICD-10-CM

## 2022-02-11 DIAGNOSIS — I152 Hypertension secondary to endocrine disorders: Secondary | ICD-10-CM

## 2022-02-11 DIAGNOSIS — M67472 Ganglion, left ankle and foot: Secondary | ICD-10-CM | POA: Diagnosis not present

## 2022-02-11 DIAGNOSIS — E785 Hyperlipidemia, unspecified: Secondary | ICD-10-CM | POA: Diagnosis not present

## 2022-02-11 DIAGNOSIS — Z720 Tobacco use: Secondary | ICD-10-CM

## 2022-02-11 DIAGNOSIS — E1159 Type 2 diabetes mellitus with other circulatory complications: Secondary | ICD-10-CM | POA: Diagnosis not present

## 2022-02-11 LAB — HEPATIC FUNCTION PANEL
ALT: 22 U/L (ref 0–53)
AST: 20 U/L (ref 0–37)
Albumin: 4.4 g/dL (ref 3.5–5.2)
Alkaline Phosphatase: 79 U/L (ref 39–117)
Bilirubin, Direct: 0.1 mg/dL (ref 0.0–0.3)
Total Bilirubin: 0.4 mg/dL (ref 0.2–1.2)
Total Protein: 7.6 g/dL (ref 6.0–8.3)

## 2022-02-11 LAB — URINALYSIS, ROUTINE W REFLEX MICROSCOPIC
Bilirubin Urine: NEGATIVE
Ketones, ur: NEGATIVE
Leukocytes,Ua: NEGATIVE
Nitrite: NEGATIVE
Specific Gravity, Urine: <=1.005 — AB (ref 1.000–1.030)
Total Protein, Urine: NEGATIVE
Urine Glucose: NEGATIVE
Urobilinogen, UA: 0.2 (ref 0.0–1.0)
WBC, UA: NONE SEEN (ref 0–?)
pH: 6 (ref 5.0–8.0)

## 2022-02-11 LAB — CBC WITH DIFFERENTIAL/PLATELET
Basophils Absolute: 0 10*3/uL (ref 0.0–0.1)
Basophils Relative: 0.4 % (ref 0.0–3.0)
Eosinophils Absolute: 0.1 10*3/uL (ref 0.0–0.7)
Eosinophils Relative: 1.7 % (ref 0.0–5.0)
HCT: 41.4 % (ref 39.0–52.0)
Hemoglobin: 14 g/dL (ref 13.0–17.0)
Lymphocytes Relative: 29.1 % (ref 12.0–46.0)
Lymphs Abs: 2.3 10*3/uL (ref 0.7–4.0)
MCHC: 33.9 g/dL (ref 30.0–36.0)
MCV: 86.9 fl (ref 78.0–100.0)
Monocytes Absolute: 0.7 10*3/uL (ref 0.1–1.0)
Monocytes Relative: 9.2 % (ref 3.0–12.0)
Neutro Abs: 4.7 10*3/uL (ref 1.4–7.7)
Neutrophils Relative %: 59.6 % (ref 43.0–77.0)
Platelets: 256 10*3/uL (ref 150.0–400.0)
RBC: 4.76 Mil/uL (ref 4.22–5.81)
RDW: 14.5 % (ref 11.5–15.5)
WBC: 7.9 10*3/uL (ref 4.0–10.5)

## 2022-02-11 LAB — BASIC METABOLIC PANEL
BUN: 13 mg/dL (ref 6–23)
CO2: 28 mEq/L (ref 19–32)
Calcium: 9.7 mg/dL (ref 8.4–10.5)
Chloride: 104 mEq/L (ref 96–112)
Creatinine, Ser: 0.83 mg/dL (ref 0.40–1.50)
GFR: 89.05 mL/min (ref >60.00)
Glucose, Bld: 96 mg/dL (ref 70–99)
Potassium: 4.8 mEq/L (ref 3.5–5.1)
Sodium: 139 mEq/L (ref 135–145)

## 2022-02-11 LAB — MICROALBUMIN / CREATININE URINE RATIO
Creatinine,U: 23.7 mg/dL
Microalb Creat Ratio: 3.1 mg/g (ref 0.0–30.0)
Microalb, Ur: 0.7 mg/dL (ref 0.0–1.9)

## 2022-02-11 LAB — HEMOGLOBIN A1C: Hgb A1c MFr Bld: 8.1 % — ABNORMAL HIGH (ref 4.6–6.5)

## 2022-02-11 LAB — PSA: PSA: 0.36 ng/mL (ref 0.10–4.00)

## 2022-02-11 MED ORDER — OLMESARTAN MEDOXOMIL 40 MG PO TABS
40.0000 mg | ORAL_TABLET | Freq: Every day | ORAL | 1 refills | Status: DC
  Filled 2022-02-11 – 2022-03-28 (×2): qty 90, 90d supply, fill #0
  Filled 2022-09-02: qty 90, 90d supply, fill #1

## 2022-02-11 MED ORDER — SYNJARDY 5-500 MG PO TABS: 1.0000 | ORAL_TABLET | Freq: Two times a day (BID) | ORAL | 0 refills | Status: DC

## 2022-02-11 NOTE — Progress Notes (Signed)
Subjective:  Patient ID: Allen Whitehead, male    DOB: 13-Aug-1950  Age: 72 y.o. MRN: [id number]  CC: Annual Exam, Coronary Artery Disease, Hypertension, and Diabetes   HPI Allen Whitehead presents for a CPX and f/up -  He is not taking the ARB.  He is active and denies chest pain, shortness of breath, diaphoresis, or edema.  He complains that over the last few months he has developed a cyst over the tip of his left fourth toe.  Outpatient Medications Prior to Visit  Medication Sig Dispense Refill   aspirin 81 MG EC tablet Take 1 tablet (81 mg total) by mouth daily. Swallow whole. 30 tablet 11   nicotine (NICODERM CQ - DOSED IN MG/24 HOURS) 21 mg/24hr patch Place 1 patch (21 mg total) onto the skin daily. 28 patch 0   nitroGLYCERIN (NITROSTAT) 0.4 MG SL tablet Place 1 tablet (0.4 mg total) under the tongue every 5 (five) minutes x 3 doses as needed for chest pain. 25 tablet 3   rosuvastatin (CRESTOR) 40 MG tablet Take 1 tablet (40 mg total) by mouth daily. 90 tablet 3   ticagrelor (BRILINTA) 90 MG TABS tablet Take 1 tablet (90 mg total) by mouth 2 (two) times daily. 180 tablet 3   metFORMIN (GLUCOPHAGE) 500 MG tablet TAKE 1 TABLET BY MOUTH EVERY DAY WITH BREAKFAST 90 tablet 1   olmesartan (BENICAR) 40 MG tablet Take 1 tablet (40 mg total) by mouth daily. 90 tablet 1   No facility-administered medications prior to visit.    ROS Review of Systems  Constitutional: Negative.  Negative for chills, diaphoresis, fatigue and fever.  HENT: Negative.    Eyes: Negative.   Respiratory:  Negative for chest tightness, shortness of breath and wheezing.   Cardiovascular:  Negative for chest pain, palpitations and leg swelling.  Gastrointestinal:  Negative for abdominal pain, diarrhea, nausea and vomiting.  Endocrine: Negative.   Genitourinary: Negative.  Negative for difficulty urinating.  Musculoskeletal: Negative.  Negative for arthralgias and myalgias.  Skin: Negative.   Neurological:   Negative for dizziness and weakness.  Hematological:  Negative for adenopathy. Does not bruise/bleed easily.  Psychiatric/Behavioral: Negative.      Objective:  BP (!) 160/86 (BP Location: Right Arm, Patient Position: Sitting)   Pulse 89   Temp 98.2 F (36.8 C) (Oral)   Ht '5\' 11"'$  (1.803 m)   Wt 220 lb (99.8 kg)   SpO2 94%   BMI 30.68 kg/m   BP Readings from Last 3 Encounters:  02/11/22 (!) 160/86  10/11/21 (!) 144/78  09/11/21 120/84    Wt Readings from Last 3 Encounters:  02/11/22 220 lb (99.8 kg)  12/11/21 216 lb (98 kg)  10/11/21 216 lb (98 kg)    Physical Exam Vitals reviewed.  HENT:     Mouth/Throat:     Mouth: Mucous membranes are moist.  Eyes:     General: No scleral icterus.    Conjunctiva/sclera: Conjunctivae normal.  Cardiovascular:     Rate and Rhythm: Normal rate and regular rhythm.     Heart sounds: No murmur heard. Pulmonary:     Effort: Pulmonary effort is normal.     Breath sounds: No stridor. No wheezing, rhonchi or rales.  Abdominal:     General: Abdomen is flat.     Palpations: There is no mass.     Tenderness: There is no abdominal tenderness. There is no guarding.     Hernia: No hernia is present.  Genitourinary:  Comments: Unable to undress for a GU/rectal exam Musculoskeletal:        General: Normal range of motion.     Cervical back: Neck supple.     Right lower leg: No edema.     Left lower leg: No edema.  Lymphadenopathy:     Cervical: No cervical adenopathy.  Skin:    General: Skin is warm and dry.  Neurological:     General: No focal deficit present.     Mental Status: He is alert.  Psychiatric:        Mood and Affect: Mood normal.        Behavior: Behavior normal.     Lab Results  Component Value Date   WBC 7.9 02/11/2022   HGB 14.0 02/11/2022   HCT 41.4 02/11/2022   PLT 256.0 02/11/2022   GLUCOSE 96 02/11/2022   CHOL 99 (L) 07/17/2021   TRIG 70 07/17/2021   HDL 42 07/17/2021   LDLCALC 42 07/17/2021   ALT 22  02/11/2022   AST 20 02/11/2022   NA 139 02/11/2022   K 4.8 02/11/2022   CL 104 02/11/2022   CREATININE 0.83 02/11/2022   BUN 13 02/11/2022   CO2 28 02/11/2022   TSH 2.580 07/17/2021   PSA 0.36 02/11/2022   HGBA1C 8.1 (H) 02/11/2022   MICROALBUR 0.7 02/11/2022    CARDIAC CATHETERIZATION  Result Date: 04/23/2021   Prox LAD lesion is 80% stenosed.   A drug-eluting stent was successfully placed using a STENT ONYX FRONTIER 3.5X18, postdilated to 3.8 mm and optimized with intravascular ultrasound.   Mid Cx lesion is 50% stenosed.  Cross-sectional area 4.57 mm by intravascular ultrasound.   Post intervention, there is a 0% residual stenosis.   The left ventricular systolic function is normal.   LV end diastolic pressure is normal.   The left ventricular ejection fraction is 55-65% by visual estimate.   There is no aortic valve stenosis. Severe LAD disease.  This was successfully treated with a drug-eluting stent as noted above.  Continue dual antiplatelet therapy for 12 months.  Consider clopidogrel monotherapy after 12 months. Moderate circumflex disease.  Adequate cross-sectional area. Continue aggressive secondary prevention.   ECHOCARDIOGRAM COMPLETE  Result Date: 04/23/2021    ECHOCARDIOGRAM REPORT   Patient Name:   Allen Whitehead Date of Exam: 04/23/2021 Medical Rec #:  [id number]        Height:       71.0 in Accession #:    [id number]       Weight:       209.9 lb Date of Birth:  09-06-1950        BSA:          2.152 m Patient Age:    87 years         BP:           110/66 mmHg Patient Gender: M                HR:           66 bpm. Exam Location:  Inpatient Procedure: 2D Echo, 3D Echo, Cardiac Doppler, Color Doppler and Intracardiac            Opacification Agent Indications:    122-I22.9 Subsequent ST elevation (STEM) and non-ST elevation                 (NSTEMI) myocardial infarction  History:        Patient has prior history of Echocardiogram examinations,  most                 recent 11/18/2013.  Signs/Symptoms:Dizziness/Lightheadedness;                 Risk Factors:Hypertension, Diabetes, Dyslipidemia and Current                 Smoker.  Sonographer:    Roseanna Rainbow RDCS Referring Phys: [address] [place] Comments: Technically difficult study due to poor echo windows. IMPRESSIONS  1. Left ventricular ejection fraction, by estimation, is 60 to 65%. The left ventricle has normal function. The left ventricle has no regional wall motion abnormalities. There is mild left ventricular hypertrophy. Left ventricular diastolic parameters were normal.  2. Right ventricular systolic function is normal. The right ventricular size is normal.  3. The mitral valve is normal in structure. No evidence of mitral valve regurgitation. No evidence of mitral stenosis.  4. The aortic valve is tricuspid. Aortic valve regurgitation is not visualized. Aortic valve sclerosis is present, with no evidence of aortic valve stenosis.  5. Aortic dilatation noted. There is borderline dilatation of the aortic root, measuring 38 mm. There is mild dilatation of the ascending aorta, measuring 42 mm.  6. The inferior vena cava is normal in size with greater than 50% respiratory variability, suggesting right atrial pressure of 3 mmHg. Comparison(s): Prior images unable to be directly viewed. FINDINGS  Left Ventricle: Left ventricular ejection fraction, by estimation, is 60 to 65%. The left ventricle has normal function. The left ventricle has no regional wall motion abnormalities. Definity contrast agent was given IV to delineate the left ventricular  endocardial borders. The left ventricular internal cavity size was normal in size. There is mild left ventricular hypertrophy. Left ventricular diastolic parameters were normal. Right Ventricle: The right ventricular size is normal. Right ventricular systolic function is normal. Left Atrium: Left atrial size was normal in size. Right Atrium: Right atrial size was normal in size. Pericardium:  There is no evidence of pericardial effusion. Mitral Valve: The mitral valve is normal in structure. No evidence of mitral valve regurgitation. No evidence of mitral valve stenosis. Tricuspid Valve: The tricuspid valve is normal in structure. Tricuspid valve regurgitation is trivial. No evidence of tricuspid stenosis. Aortic Valve: The aortic valve is tricuspid. Aortic valve regurgitation is not visualized. Aortic valve sclerosis is present, with no evidence of aortic valve stenosis. Pulmonic Valve: The pulmonic valve was not well visualized. Pulmonic valve regurgitation is not visualized. No evidence of pulmonic stenosis. Aorta: Aortic dilatation noted. There is borderline dilatation of the aortic root, measuring 38 mm. There is mild dilatation of the ascending aorta, measuring 42 mm. Venous: The inferior vena cava is normal in size with greater than 50% respiratory variability, suggesting right atrial pressure of 3 mmHg. IAS/Shunts: No atrial level shunt detected by color flow Doppler.  LEFT VENTRICLE PLAX 2D LVIDd:         3.60 cm     Diastology LVIDs:         2.45 cm     LV e' medial:    8.27 cm/s LV PW:         1.40 cm     LV E/e' medial:  10.0 LV IVS:        1.00 cm     LV e' lateral:   8.81 cm/s LVOT diam:     2.40 cm     LV E/e' lateral: 9.3 LV SV:  82 LV SV Index:   38 LVOT Area:     4.52 cm  LV Volumes (MOD) LV vol d, MOD A2C: 87.8 ml LV vol d, MOD A4C: 70.4 ml LV vol s, MOD A2C: 27.6 ml LV vol s, MOD A4C: 29.3 ml LV SV MOD A2C:     60.2 ml LV SV MOD A4C:     70.4 ml LV SV MOD BP:      51.4 ml RIGHT VENTRICLE             IVC RV S prime:     14.00 cm/s  IVC diam: 2.10 cm TAPSE (M-mode): 2.3 cm LEFT ATRIUM             Index        RIGHT ATRIUM           Index LA diam:        3.50 cm 1.63 cm/m   RA Area:     11.20 cm LA Vol (A2C):   32.7 ml 15.19 ml/m  RA Volume:   22.40 ml  10.41 ml/m LA Vol (A4C):   26.1 ml 12.13 ml/m LA Biplane Vol: 31.4 ml 14.59 ml/m  AORTIC VALVE LVOT Vmax:   98.30 cm/s  LVOT Vmean:  64.350 cm/s LVOT VTI:    0.182 m  AORTA Ao Root diam: 3.80 cm Ao Asc diam:  4.15 cm MITRAL VALVE MV Area (PHT): 3.72 cm    SHUNTS MV Decel Time: 204 msec    Systemic VTI:  0.18 m MV E velocity: 82.30 cm/s  Systemic Diam: 2.40 cm MV A velocity: 81.80 cm/s MV E/A ratio:  1.01 Kirk Ruths MD Electronically signed by Kirk Ruths MD Signature Date/Time: 04/23/2021/9:50:51 AM    Final    DG Chest 2 View  Result Date: 04/22/2021 CLINICAL DATA:  Chest pain EXAM: CHEST - 2 VIEW COMPARISON:  CT 03/22/2021 FINDINGS: The heart size and mediastinal contours are within normal limits. Both lungs are clear. The visualized skeletal structures are unremarkable. IMPRESSION: No active cardiopulmonary disease. Electronically Signed   By: Kerby Moors M.D.   On: 04/22/2021 10:13    Assessment & Plan:   Saiquan was seen today for annual exam, coronary artery disease, hypertension and diabetes.  Diagnoses and all orders for this visit:  Benign prostatic hyperplasia without lower urinary tract symptoms- His PSA is normal. -     PSA; Future -     PSA  Encounter for general adult medical examination with abnormal findings- Exam completed, labs reviewed, vaccines reviewed, cancer screenings are up-to-date, patient education was given.  Hyperlipidemia associated with type 2 diabetes mellitus ([place])- LDL goal achieved. Doing well on the statin  -     Hepatic function panel; Future -     Hepatic function panel  Type 2 diabetes mellitus with other circulatory complication, without long-term current use of insulin ([place])- His A1c is up to 8.1%.  Will treat with metformin and an SGLT2 inhibitor. -     Basic metabolic panel; Future -     Hemoglobin A1c; Future -     Microalbumin / creatinine urine ratio; Future -     Microalbumin / creatinine urine ratio -     Hemoglobin A1c -     Basic metabolic panel -     Empagliflozin-metFORMIN HCl (SYNJARDY) 5-500 MG TABS; Take 1 tablet by mouth 2 (two) times  daily.  Malignant hypertension- His blood pressure is not adequately well-controlled.  Will restart the ARB. 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   I have discontinued Gwyndolyn Saxon A. Karney's metFORMIN. I am also having him start on Synjardy. Additionally, I am having him maintain his aspirin EC, nitroGLYCERIN, rosuvastatin, nicotine, ticagrelor, and olmesartan.  Meds ordered this encounter  Medications   olmesartan (BENICAR) 40 MG tablet    Sig: Take 1 tablet (40 mg total) by mouth daily.    Dispense:  90 tablet    Refill:  1   Empagliflozin-metFORMIN HCl (SYNJARDY) 5-500 MG TABS    Sig: Take 1 tablet by mouth 2 (two) times daily.    Dispense:  180 tablet    Refill:  0     Follow-up: Return in about 3 months (around 05/13/2022).  Scarlette Calico, MD

## 2022-02-11 NOTE — Patient Instructions (Signed)
Health Maintenance, Male Adopting a healthy lifestyle and getting preventive care are important in promoting health and wellness. Ask your health care provider about: The right schedule for you to have regular tests and exams. Things you can do on your own to prevent diseases and keep yourself healthy. What should I know about diet, weight, and exercise? Eat a healthy diet  Eat a diet that includes plenty of vegetables, fruits, low-fat dairy products, and lean protein. Do not eat a lot of foods that are high in solid fats, added sugars, or sodium. Maintain a healthy weight Body mass index (BMI) is a measurement that can be used to identify possible weight problems. It estimates body fat based on height and weight. Your health care provider can help determine your BMI and help you achieve or maintain a healthy weight. Get regular exercise Get regular exercise. This is one of the most important things you can do for your health. Most adults should: Exercise for at least 150 minutes each week. The exercise should increase your heart rate and make you sweat (moderate-intensity exercise). Do strengthening exercises at least twice a week. This is in addition to the moderate-intensity exercise. Spend less time sitting. Even light physical activity can be beneficial. Watch cholesterol and blood lipids Have your blood tested for lipids and cholesterol at 70 years of age, then have this test every 5 years. You may need to have your cholesterol levels checked more often if: Your lipid or cholesterol levels are high. You are older than 70 years of age. You are at high risk for heart disease. What should I know about cancer screening? Many types of cancers can be detected early and may often be prevented. Depending on your health history and family history, you may need to have cancer screening at various ages. This may include screening for: Colorectal cancer. Prostate cancer. Skin cancer. Lung  cancer. What should I know about heart disease, diabetes, and high blood pressure? Blood pressure and heart disease High blood pressure causes heart disease and increases the risk of stroke. This is more likely to develop in people who have high blood pressure readings or are overweight. Talk with your health care provider about your target blood pressure readings. Have your blood pressure checked: Every 3-5 years if you are 18-39 years of age. Every year if you are 40 years old or older. If you are between the ages of 65 and 75 and are a current or former smoker, ask your health care provider if you should have a one-time screening for abdominal aortic aneurysm (AAA). Diabetes Have regular diabetes screenings. This checks your fasting blood sugar level. Have the screening done: Once every three years after age 45 if you are at a normal weight and have a low risk for diabetes. More often and at a younger age if you are overweight or have a high risk for diabetes. What should I know about preventing infection? Hepatitis B If you have a higher risk for hepatitis B, you should be screened for this virus. Talk with your health care provider to find out if you are at risk for hepatitis B infection. Hepatitis C Blood testing is recommended for: Everyone born from 1945 through 1965. Anyone with known risk factors for hepatitis C. Sexually transmitted infections (STIs) You should be screened each year for STIs, including gonorrhea and chlamydia, if: You are sexually active and are younger than 70 years of age. You are older than 70 years of age and your   health care provider tells you that you are at risk for this type of infection. Your sexual activity has changed since you were last screened, and you are at increased risk for chlamydia or gonorrhea. Ask your health care provider if you are at risk. Ask your health care provider about whether you are at high risk for HIV. Your health care provider  may recommend a prescription medicine to help prevent HIV infection. If you choose to take medicine to prevent HIV, you should first get tested for HIV. You should then be tested every 3 months for as long as you are taking the medicine. Follow these instructions at home: Alcohol use Do not drink alcohol if your health care provider tells you not to drink. If you drink alcohol: Limit how much you have to 0-2 drinks a day. Know how much alcohol is in your drink. In the [place] one drink equals one 12 oz bottle of beer (355 mL), one 5 oz glass of wine (148 mL), or one 1 oz glass of hard liquor (44 mL). Lifestyle Do not use any products that contain nicotine or tobacco. These products include cigarettes, chewing tobacco, and vaping devices, such as e-cigarettes. If you need help quitting, ask your health care provider. Do not use street drugs. Do not share needles. Ask your health care provider for help if you need support or information about quitting drugs. General instructions Schedule regular health, dental, and eye exams. Stay current with your vaccines. Tell your health care provider if: You often feel depressed. You have ever been abused or do not feel safe at home. Summary Adopting a healthy lifestyle and getting preventive care are important in promoting health and wellness. Follow your health care provider's instructions about healthy diet, exercising, and getting tested or screened for diseases. Follow your health care provider's instructions on monitoring your cholesterol and blood pressure. This information is not intended to replace advice given to you by your health care provider. Make sure you discuss any questions you have with your health care provider. Document Revised: 05/29/2020 Document Reviewed: 05/29/2020 Elsevier Patient Education  2023 Elsevier Inc.  

## 2022-02-12 ENCOUNTER — Encounter: Payer: Self-pay | Admitting: Internal Medicine

## 2022-02-18 ENCOUNTER — Ambulatory Visit: Payer: Medicare HMO | Admitting: Podiatry

## 2022-02-18 ENCOUNTER — Other Ambulatory Visit (HOSPITAL_COMMUNITY): Payer: Self-pay

## 2022-02-18 ENCOUNTER — Ambulatory Visit (INDEPENDENT_AMBULATORY_CARE_PROVIDER_SITE_OTHER): Payer: Medicare HMO

## 2022-02-18 DIAGNOSIS — M67479 Ganglion, unspecified ankle and foot: Secondary | ICD-10-CM | POA: Diagnosis not present

## 2022-02-18 DIAGNOSIS — M67472 Ganglion, left ankle and foot: Secondary | ICD-10-CM | POA: Diagnosis not present

## 2022-02-18 DIAGNOSIS — L539 Erythematous condition, unspecified: Secondary | ICD-10-CM | POA: Diagnosis not present

## 2022-02-18 MED ORDER — CEPHALEXIN 500 MG PO CAPS
500.0000 mg | ORAL_CAPSULE | Freq: Three times a day (TID) | ORAL | 0 refills | Status: DC
  Filled 2022-02-18: qty 30, 10d supply, fill #0

## 2022-02-18 NOTE — Patient Instructions (Signed)

## 2022-02-18 NOTE — Progress Notes (Signed)
Subjective:   Patient ID: Allen Whitehead, male   DOB: 72 y.o.   MRN: [id number]   HPI Chief Complaint  Patient presents with   Foot Problem    Digital mucinous cyst of toe of left foo    72 year old male presents the office today with above concerns.  The first toe was ingrown toenail when he found a rose form on the side of the nail.  He had some swelling to the area.  No drainage or pus at this time.  No injuries otherwise.  Last A1c 8.1 on 02/11/2022   Review of Systems  All other systems reviewed and are negative.  Past Medical History:  Diagnosis Date   Anxiety    Arthritis    CAD (coronary artery disease), native coronary artery    04/23/21: 80% pLAD-DES, 50% Cx treated medically   Depression    Diabetes mellitus without complication (H[facility])    Elevated plasma metanephrines    GERD (gastroesophageal reflux disease)    past hx    Hyperlipidemia with target LDL less than 70    Hypertension    Kidney stones     Past Surgical History:  Procedure Laterality Date   BIOPSY  02/15/2020   Procedure: BIOPSY;  Surgeon: [name], MD;  Location: [facility];  Service: Endoscopy;;   COLONOSCOPY     age 31 for blood in stool- Normal    COLONOSCOPY WITH PROPOFOL N/A 02/15/2020   Procedure: COLONOSCOPY WITH PROPOFOL;  Surgeon: [name], MD;  Location: [facility];  Service: Endoscopy;  Laterality: N/A;   CORONARY STENT INTERVENTION N/A 04/23/2021   Procedure: CORONARY STENT INTERVENTION;  Surgeon: [name], MD;  Location: [place] [facility];  Service: Cardiovascular;  Laterality: N/A;   ENDOSCOPIC MUCOSAL RESECTION  02/15/2020   Procedure: ENDOSCOPIC MUCOSAL RESECTION;  Surgeon: [name], MD;  Location: [facility];  Service: Endoscopy;;   kidney stone retrieval     lasar treatment on [place]     LEFT HEART CATH AND CORONARY ANGIOGRAPHY N/A 04/23/2021   Procedure: LEFT HEART CATH AND CORONARY ANGIOGRAPHY;  Surgeon: [name], MD;   Location: [facility];  Service: Cardiovascular;  Laterality: N/A;   POLYPECTOMY  02/15/2020   Procedure: POLYPECTOMY;  Surgeon: [name], MD;  Location: [facility];  Service: Endoscopy;;   SUBMUCOSAL LIFTING INJECTION  02/15/2020   Procedure: SUBMUCOSAL LIFTING INJECTION;  Surgeon: [name], MD;  Location: [facility];  Service: Endoscopy;;   VASECTOMY       Current Outpatient Medications:    aspirin 81 MG EC tablet, Take 1 tablet (81 mg total) by mouth daily. Swallow whole., Disp: 30 tablet, Rfl: 11   cephALEXin (KEFLEX) 500 MG capsule, Take 1 capsule (500 mg total) by mouth 3 (three) times daily., Disp: 30 capsule, Rfl: 0   metFORMIN (GLUCOPHAGE) 500 MG tablet, Take 500 mg by mouth daily., Disp: , Rfl:    nicotine (NICODERM CQ - DOSED IN MG/24 HOURS) 21 mg/24hr patch, Place 1 patch (21 mg total) onto the skin daily., Disp: 28 patch, Rfl: 0   nitroGLYCERIN (NITROSTAT) 0.4 MG SL tablet, Place 1 tablet (0.4 mg total) under the tongue every 5 (five) minutes x 3 doses as needed for chest pain., Disp: 25 tablet, Rfl: 3   olmesartan (BENICAR) 40 MG tablet, Take 1 tablet (40 mg total) by mouth daily., Disp: 90 tablet, Rfl: 1   rosuvastatin (CRESTOR) 40 MG tablet, Take 1 tablet (40  mg total) by mouth daily., Disp: 90 tablet, Rfl: 3   ticagrelor (BRILINTA) 90 MG TABS tablet, Take 1 tablet (90 mg total) by mouth 2 (two) times daily., Disp: 180 tablet, Rfl: 3   Empagliflozin-metFORMIN HCl (SYNJARDY) 5-500 MG TABS, Take 1 tablet by mouth 2 (two) times daily., Disp: 180 tablet, Rfl: 0  No Known Allergies        Objective:  Physical Exam  General: AAO x3, NAD  Dermatological: On the left foot the tissue has mild edema and erythema distal aspect of the nail but there is no drainage or pus.  No significant incurvation of the nail noted today.  There is no fluctuance or crepitation.  There is no fluid-filled cysts or soft tissue mass noted today.  Vascular: Dorsalis  Pedis artery and Posterior Tibial artery pedal pulses are 2/4 bilateral with immedate capillary fill time.  There is no pain with calf compression, swelling, warmth, erythema.   Neruologic: Grossly intact via light touch bilateral.   Musculoskeletal: Tenderness palpation to the toe. No other areas of discomfort.  Gait: Unassisted, Nonantalgic.      Assessment:   72 year old male with toenail infection     Plan:  -Treatment options discussed including all alternatives, risks, and complications -Etiology of symptoms were discussed -X-rays were obtained reviewed of the left foot.  3 views were obtained.  No subacute fracture or foreign body. -I did debride the nails and complications.  Given the mild edema erythema and cellulitis, Keflex.  I am not able to appreciate a soft tissue mass or cyst today but we will monitor this.  Discussed Epsom salt soaks as well.  Trula Slade DPM

## 2022-02-22 ENCOUNTER — Telehealth: Payer: Self-pay | Admitting: Internal Medicine

## 2022-02-22 ENCOUNTER — Other Ambulatory Visit (HOSPITAL_COMMUNITY): Payer: Self-pay

## 2022-02-22 DIAGNOSIS — E1159 Type 2 diabetes mellitus with other circulatory complications: Secondary | ICD-10-CM

## 2022-02-22 MED ORDER — SYNJARDY 5-500 MG PO TABS
1.0000 | ORAL_TABLET | Freq: Two times a day (BID) | ORAL | 0 refills | Status: DC
  Filled 2022-02-22: qty 180, 90d supply, fill #0

## 2022-02-22 NOTE — Telephone Encounter (Signed)
Patient is changing pharmacies to [facility] on [place] HCl [facility]) 5-500 MG TABS  needs to be sent there ASAP. Callback number is [phone]

## 2022-02-22 NOTE — Telephone Encounter (Signed)
Rx sent to [facility].

## 2022-02-25 ENCOUNTER — Other Ambulatory Visit (HOSPITAL_COMMUNITY): Payer: Self-pay

## 2022-02-27 ENCOUNTER — Telehealth: Payer: Self-pay

## 2022-02-27 NOTE — Telephone Encounter (Signed)
I s/w the [facility] office and went over the recommendations per pre op app and cardiologist. Fax # [phone]. I will fax notes to requesting office, please read notes in regard to procedure to be postponed until after 04/24/22.

## 2022-02-27 NOTE — Telephone Encounter (Signed)
..     Pre-operative Risk Assessment    Patient Name: Allen Whitehead  DOB: 1950-05-14 MRN: [id number]     Request for Surgical Clearance    Procedure:  Dental Extraction - Amount of Teeth to be Pulled:  6  Date of Surgery:  Clearance 03/18/22                                 Surgeon:  Tomasita Crumble Surgeon's Group or Practice Name:  [facility] Phone number:  [phone] Fax number:  [phone]   Type of Clearance Requested:   - Medical  - Pharmacy:  Hold Aspirin     Type of Anesthesia:   lidocaine/articaine   Additional requests/questions:    Gwenlyn Found   02/27/2022, 12:44 PM

## 2022-02-27 NOTE — Telephone Encounter (Signed)
   Name: Allen Whitehead  DOB: Jun 13, 1950  MRN: [id number]  Primary Cardiologist: [name], MD  Chart reviewed as part of pre-operative protocol coverage.   Patient is post PCI on 04/23/2021.  It is recommended that any elective procedure that would require interruption of DAPT be postponed until after 04/24/2022 at which time clearance can be addressed.   I will route this message as FYI to requesting party and remove this message from the preop box as separate preop APP input not needed at this time.   Please call with any questions.  Lenna Sciara, NP  02/27/2022, 1:00 PM

## 2022-03-12 ENCOUNTER — Ambulatory Visit: Payer: Medicare HMO | Admitting: Podiatry

## 2022-03-20 ENCOUNTER — Encounter: Payer: Self-pay | Admitting: Internal Medicine

## 2022-03-25 ENCOUNTER — Ambulatory Visit (HOSPITAL_COMMUNITY): Payer: Medicare HMO

## 2022-03-25 ENCOUNTER — Telehealth: Payer: Self-pay

## 2022-03-25 NOTE — Progress Notes (Unsigned)
Office Visit    Patient Name: Allen Whitehead Date of Encounter: 03/27/2022  PCP:  [name], MD   [facility]  Cardiologist:  [name], MD  Advanced Practice Provider:  No care team member to display Electrophysiologist:  None   HPI    Allen Whitehead is a 72 y.o. male with past medical history of hypertension with diabetes mellitus, RA, tobacco abuse, elevated plasma metanephrines NOS (unable to get further evaluation due to insurance issues), CAC and aortic atherosclerosis, mild aortic dilation seen initially for NSTEMI in 2023 status post PCI presents today for preop evaluation.  Beta-blocker was stopped for nocturnal HV, had hypotension on high-dose Benicar which was decreased.  He was last seen 09/04/2021 and at that time was doing okay.  Was having some financial distress.  Started back smoking and discussed stressors at length.  No chest pain or pressure at that time.  No SOB/DOE.  No weight gain or leg swelling.  No palpitations or syncope.  Today, he states that he is not having any chest pain or shortness of breath.  He is having some extensive dental work done and needs cardiac clearance.  Overall, he has been doing well since his last appointment with [place].  He continues to smoke but states that he is unwilling to quit at this time since he has enjoyed minimum smoking.  A lot of his friends have passed away due to alcoholism and was so winded that he is only smoking.  He is scheduled to have dental work done but we discussed that he would not be able to do this until he has been a year out from his PCI.  This would mean he cannot have any dental work done until 04/24/2022 after because he will need to hold his Brilinta/aspirin.  We would prefer that he continue his aspirin throughout the operative time and just hold the Brilinta, but if this is to have a bleeding risk, he can hold his aspirin for 5 days as well prior to the procedure.  He does  meet minimal METS today based on his DASI.  Considered to be low risk for any cardiovascular event.  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  Patient can hold Brilinta x 5 days after 04/24/2022 and would prefer aspirin be continued throughout the operative timeframe.  If bleeding risk is too high, can hold aspirin x 5 days prior to the procedure as well.  Please restart when medically safe to do so.  Past Medical History    Past Medical History:  Diagnosis Date   Anxiety    Arthritis    CAD (coronary artery disease), native coronary artery    04/23/21: 80% pLAD-DES, 50% Cx treated medically   Depression    Diabetes mellitus without complication (HCC)    Elevated plasma metanephrines    GERD (gastroesophageal reflux disease)    past hx    Hyperlipidemia with target LDL less than 70    Hypertension    Kidney stones    Past Surgical History:  Procedure Laterality Date   BIOPSY  02/15/2020   Procedure: BIOPSY;  Surgeon: [name], MD;  Location: [facility];  Service: Endoscopy;;   COLONOSCOPY     age 72 for blood in stool- Normal    COLONOSCOPY WITH PROPOFOL N/A 02/15/2020   Procedure: COLONOSCOPY WITH  PROPOFOL;  Surgeon: [name], MD;  Location: [facility];  Service: Endoscopy;  Laterality: N/A;   CORONARY STENT INTERVENTION N/A 04/23/2021   Procedure: CORONARY STENT INTERVENTION;  Surgeon: [name], MD;  Location: [place];  Service: Cardiovascular;  Laterality: N/A;   ENDOSCOPIC MUCOSAL RESECTION  02/15/2020   Procedure: ENDOSCOPIC MUCOSAL RESECTION;  Surgeon: [name], MD;  Location: [facility];  Service: Endoscopy;;   kidney stone retrieval     lasar treatment on [place]     LEFT HEART CATH AND CORONARY ANGIOGRAPHY N/A 04/23/2021   Procedure: LEFT HEART CATH AND CORONARY ANGIOGRAPHY;  Surgeon: [name], MD;  Location: [place] [facility];  Service: Cardiovascular;  Laterality: N/A;   POLYPECTOMY  02/15/2020   Procedure: POLYPECTOMY;  Surgeon: [name], MD;  Location: [facility];  Service: Endoscopy;;   SUBMUCOSAL LIFTING INJECTION  02/15/2020   Procedure: SUBMUCOSAL LIFTING INJECTION;  Surgeon: [name], MD;  Location: [facility];  Service: Endoscopy;;   VASECTOMY      Allergies  No Known Allergies   EKGs/Labs/Other Studies Reviewed:   The following studies were reviewed today: LEFT HEART CATH AND CORONARY ANGIOGRAPHY, LEFT HEART CATH AND CORONARY ANGIOGRAPHY 04/23/2021   Narrative   Prox LAD lesion is 80% stenosed.   A drug-eluting stent was successfully placed using a STENT ONYX FRONTIER 3.5X18, postdilated to 3.8 mm and optimized with intravascular ultrasound.   Mid Cx lesion is 50% stenosed.  Cross-sectional area 4.57 mm by intravascular ultrasound.   Post intervention, there is a 0% residual stenosis.   The left ventricular systolic function is normal.   LV end diastolic pressure is normal.   The left ventricular ejection fraction is 55-65% by visual estimate.   There is no aortic valve stenosis.   Severe LAD disease.  This was successfully treated with a drug-eluting stent as noted above.  Continue dual antiplatelet therapy for 12 months.  Consider clopidogrel monotherapy after 12 months.     ECHO COMPLETE WITH IMAGING ENHANCING AGENT 04/23/2021 1. Left ventricular ejection fraction, by estimation, is 60 to 65%. The left ventricle has normal function. The left ventricle has no regional wall motion abnormalities. There is mild left ventricular hypertrophy. Left ventricular diastolic parameters were normal. 2. Right ventricular systolic function is normal. The right ventricular size is normal. 3. The mitral valve is normal in structure. No evidence of mitral valve regurgitation. No evidence of mitral stenosis. 4. The aortic valve is tricuspid. Aortic valve regurgitation  is not visualized. Aortic valve sclerosis is present, with no evidence of aortic valve stenosis. 5. Aortic dilatation noted. There is borderline dilatation of the aortic root, measuring 38 mm. There is mild dilatation of the ascending aorta, measuring 42 mm. 6. The inferior vena cava is normal in size with greater than 50% respiratory variability, suggesting right atrial pressure of 3 mmHg.     LONG TERM MONITOR(3-7 DAYS) HOOK UP AND INTERPRETATION 08/02/2021   Narrative   Patient had a minimum heart rate of 23 bpm, maximum heart rate of 197 bpm, and average heart rate of 84 bpm.   Predominant underlying rhythm was sinus rhythm.   Paroxysmal SVT lasting 17 seconds at longest with a max rate of 197 bpm at fastest.   Isolated PACs were rare (<1.0%).   Isolated PVCs were rare (<1.0%).   Second Degree AV Block-Mobitz I (Wenckebach) was present.   No triggered and diary events.   Asymptomatic SVT with asymptomatic  nocturnal bradycardia and Wenckebach heart block.     US AORTA 02/15/2021   Narrative CLINICAL DATA:  History of bruit.   EXAM: ULTRASOUND OF ABDOMINAL AORTA   TECHNIQUE: Ultrasound examination of the abdominal aorta and proximal common iliac arteries was performed to evaluate for aneurysm. Additional color and Doppler images of the distal aorta were obtained to document patency.   COMPARISON:  CT chest 11/13/2013.   FINDINGS: Abdominal aortic measurements as follows:   Proximal:  Obscured by bowel gas.   Mid:  2.3 cm   Distal:  2.3 cm Patent: Yes, peak systolic velocity is 99991111 cm/s   Right common iliac artery: 1.4 cm   Left common iliac artery: 1.3 cm   IMPRESSION: No evidence of abdominal aortic aneurysm. Proximal aorta is obscured by overlying bowel gas.      EKG:  EKG is  ordered today.  The ekg ordered today demonstrates NSR, rate 87 bpm  Recent Labs: 07/17/2021: TSH 2.580 02/11/2022: ALT 22; BUN 13; Creatinine, Ser 0.83; Hemoglobin 14.0; Platelets  256.0; Potassium 4.8; Sodium 139  Recent Lipid Panel    Component Value Date/Time   CHOL 99 (L) 07/17/2021 0934   TRIG 70 07/17/2021 0934   HDL 42 07/17/2021 0934   CHOLHDL 2.4 07/17/2021 0934   CHOLHDL 5.3 04/23/2021 0047   VLDL 20 04/23/2021 0047   LDLCALC 42 07/17/2021 0934     Home Medications   Current Meds  Medication Sig   aspirin 81 MG EC tablet Take 1 tablet (81 mg total) by mouth daily. Swallow whole.   Empagliflozin-metFORMIN HCl (SYNJARDY) 5-500 MG TABS Take 1 tablet by mouth 2 (two) times daily.   nicotine (NICODERM CQ - DOSED IN MG/24 HOURS) 21 mg/24hr patch Place 1 patch (21 mg total) onto the skin daily.   nitroGLYCERIN (NITROSTAT) 0.4 MG SL tablet Place 1 tablet (0.4 mg total) under the tongue every 5 (five) minutes x 3 doses as needed for chest pain.   olmesartan (BENICAR) 40 MG tablet Take 1 tablet (40 mg total) by mouth daily.   rosuvastatin (CRESTOR) 40 MG tablet Take 1 tablet (40 mg total) by mouth daily.   ticagrelor (BRILINTA) 90 MG TABS tablet Take 1 tablet (90 mg total) by mouth 2 (two) times daily.   [DISCONTINUED] cephALEXin (KEFLEX) 500 MG capsule Take 1 capsule (500 mg total) by mouth 3 (three) times daily.   [DISCONTINUED] metFORMIN (GLUCOPHAGE) 500 MG tablet Take 500 mg by mouth daily.     Review of Systems      All other systems reviewed and are otherwise negative except as noted above.  Physical Exam    VS:  BP 134/70   Pulse 87   Ht '5\' 11"'$  (1.803 m)   Wt 217 lb (98.4 kg)   SpO2 99%   BMI 30.27 kg/m  , BMI Body mass index is 30.27 kg/m.  Wt Readings from Last 3 Encounters:  03/27/22 217 lb (98.4 kg)  02/11/22 220 lb (99.8 kg)  12/11/21 216 lb (98 kg)     GEN: Well nourished, well developed, in no acute distress. HEENT: normal. Neck: Supple, no JVD, carotid bruits, or masses. Cardiac: RRR, no murmurs, rubs, or gallops. No clubbing, cyanosis, edema.  Radials/PT 2+ and equal bilaterally.  Respiratory:  Respirations regular and  unlabored, clear to auscultation bilaterally. GI: Soft, nontender, nondistended. MS: No deformity or atrophy. Skin: Warm and dry, no rash. Neuro:  Strength and sensation are intact. Psych: Normal affect.  Assessment & Plan  Preop op evaluation  Allen Whitehead's perioperative risk of a major cardiac event is 0.9% according to the Revised Cardiac Risk Index (RCRI).  Therefore, he is at low risk for perioperative complications.   His functional capacity is good at 5.62 METs according to the Duke Activity Status Index (DASI). Recommendations: According to ACC/AHA guidelines, no further cardiovascular testing needed.  The patient may proceed to surgery at acceptable risk.   Antiplatelet and/or Anticoagulation Recommendations: Aspirin can be held for 5 days prior to his surgery.  Please resume Aspirin post operatively when it is felt to be safe from a bleeding standpoint.  Ticagrelor (Brilinta) can be held for 5 days prior to his surgery and resumed as soon as possible post op.  He will need to wait until after 04/24/22  Coronary artery disease -No recent chest pain or shortness breath -Continue current medications including aspirin 81 mg daily, Brilinta 90 mg twice a day, nitro as needed, Benicar 40 mg daily, and Crestor 40 mg daily  Continuous tobacco abuse -Smoking cessation advised, patient agreed to try and cut down  Hyperlipidemia/aortic atherosclerosis -Most recent lipid panel showed LDL 42, HDL 42, triglycerides 70 -Continue Crestor 40 mg daily    Disposition: Follow up 6 months with [name], MD or APP.  Signed, Elgie Collard, PA-C 03/27/2022, 9:47 AM [place]

## 2022-03-25 NOTE — Telephone Encounter (Signed)
Spoke with patient regarding message left last week that PA has not yet been approved for LDCT today.  Patient states he has "lots of other health conditions going on right now" and did not wish to reschedule the LDCT right now.  He states he has other medical tests that [facility] does not approve and says the LDCT is not his priority right now.  He understands we will cancel the LDCT today since PA pending and he states he would consider rescheduling maybe next year in 2025 but he does not wish to do a LDCT every year since he has so many other medical issues.  LDCT appt cancelled for today.

## 2022-03-27 ENCOUNTER — Ambulatory Visit: Payer: Medicare HMO | Attending: Internal Medicine | Admitting: Physician Assistant

## 2022-03-27 ENCOUNTER — Encounter: Payer: Self-pay | Admitting: Physician Assistant

## 2022-03-27 VITALS — BP 134/70 | HR 87 | Ht 71.0 in | Wt 217.0 lb

## 2022-03-27 DIAGNOSIS — Z01810 Encounter for preprocedural cardiovascular examination: Secondary | ICD-10-CM | POA: Diagnosis not present

## 2022-03-27 DIAGNOSIS — I700 Atherosclerosis of aorta: Secondary | ICD-10-CM | POA: Diagnosis not present

## 2022-03-27 DIAGNOSIS — I2510 Atherosclerotic heart disease of native coronary artery without angina pectoris: Secondary | ICD-10-CM | POA: Diagnosis not present

## 2022-03-27 DIAGNOSIS — Z720 Tobacco use: Secondary | ICD-10-CM

## 2022-03-27 DIAGNOSIS — E785 Hyperlipidemia, unspecified: Secondary | ICD-10-CM | POA: Diagnosis not present

## 2022-03-27 NOTE — Patient Instructions (Signed)
Medication Instructions:  Your physician recommends that you continue on your current medications as directed. Please refer to the Current Medication list given to you today.  *If you need a refill on your cardiac medications before your next appointment, please call your pharmacy*   Lab Work: None ordered   If you have labs (blood work) drawn today and your tests are completely normal, you will receive your results only by: [place] (if you have MyChart) OR A paper copy in the mail If you have any lab test that is abnormal or we need to change your treatment, we will call you to review the results.       Follow-Up: At [facility], you and your health needs are our priority.  As part of our continuing mission to provide you with exceptional heart care, we have created designated Provider Care Teams.  These Care Teams include your primary Cardiologist (physician) and Advanced Practice Providers (APPs -  Physician Assistants and Nurse Practitioners) who all work together to provide you with the care you need, when you need it.  We recommend signing up for the patient portal called "MyChart".  Sign up information is provided on this After Visit Summary.  MyChart is used to connect with patients for Virtual Visits (Telemedicine).  Patients are able to view lab/test results, encounter notes, upcoming appointments, etc.  Non-urgent messages can be sent to your provider as well.   To learn more about what you can do with MyChart, go to [email or web address].    Your next appointment:   6 month(s)  Provider:   [name], MD     Other Instructions

## 2022-03-28 ENCOUNTER — Other Ambulatory Visit (HOSPITAL_COMMUNITY): Payer: Self-pay

## 2022-04-01 ENCOUNTER — Ambulatory Visit: Payer: Medicare HMO | Admitting: Internal Medicine

## 2022-04-12 ENCOUNTER — Other Ambulatory Visit (HOSPITAL_COMMUNITY): Payer: Self-pay

## 2022-04-29 ENCOUNTER — Other Ambulatory Visit: Payer: Self-pay | Admitting: Internal Medicine

## 2022-04-29 ENCOUNTER — Other Ambulatory Visit (HOSPITAL_COMMUNITY): Payer: Self-pay

## 2022-04-29 MED ORDER — ROSUVASTATIN CALCIUM 40 MG PO TABS
40.0000 mg | ORAL_TABLET | Freq: Every day | ORAL | 0 refills | Status: DC
  Filled 2022-04-29: qty 90, 90d supply, fill #0

## 2022-04-30 ENCOUNTER — Other Ambulatory Visit (HOSPITAL_COMMUNITY): Payer: Self-pay

## 2022-06-25 ENCOUNTER — Other Ambulatory Visit (HOSPITAL_COMMUNITY): Payer: Self-pay

## 2022-06-25 MED ORDER — CHLORHEXIDINE GLUCONATE 0.12 % MT SOLN
OROMUCOSAL | 0 refills | Status: DC
  Filled 2022-06-25: qty 473, 16d supply, fill #0

## 2022-07-09 ENCOUNTER — Encounter: Payer: Self-pay | Admitting: Internal Medicine

## 2022-07-09 ENCOUNTER — Ambulatory Visit (INDEPENDENT_AMBULATORY_CARE_PROVIDER_SITE_OTHER): Payer: Medicare HMO | Admitting: Internal Medicine

## 2022-07-09 VITALS — BP 138/80 | HR 79 | Temp 98.1°F | Ht 71.0 in | Wt 203.0 lb

## 2022-07-09 DIAGNOSIS — Z794 Long term (current) use of insulin: Secondary | ICD-10-CM

## 2022-07-09 DIAGNOSIS — I700 Atherosclerosis of aorta: Secondary | ICD-10-CM

## 2022-07-09 DIAGNOSIS — E1169 Type 2 diabetes mellitus with other specified complication: Secondary | ICD-10-CM | POA: Diagnosis not present

## 2022-07-09 DIAGNOSIS — I10 Essential (primary) hypertension: Secondary | ICD-10-CM

## 2022-07-09 DIAGNOSIS — I152 Hypertension secondary to endocrine disorders: Secondary | ICD-10-CM

## 2022-07-09 DIAGNOSIS — E1159 Type 2 diabetes mellitus with other circulatory complications: Secondary | ICD-10-CM

## 2022-07-09 DIAGNOSIS — Z720 Tobacco use: Secondary | ICD-10-CM

## 2022-07-09 DIAGNOSIS — E785 Hyperlipidemia, unspecified: Secondary | ICD-10-CM | POA: Diagnosis not present

## 2022-07-09 LAB — CBC WITH DIFFERENTIAL/PLATELET
Basophils Absolute: 0 10*3/uL (ref 0.0–0.1)
Basophils Relative: 0.3 % (ref 0.0–3.0)
Eosinophils Absolute: 0.1 10*3/uL (ref 0.0–0.7)
Eosinophils Relative: 1.5 % (ref 0.0–5.0)
HCT: 41.6 % (ref 39.0–52.0)
Hemoglobin: 13.9 g/dL (ref 13.0–17.0)
Lymphocytes Relative: 26.8 % (ref 12.0–46.0)
Lymphs Abs: 2.4 10*3/uL (ref 0.7–4.0)
MCHC: 33.3 g/dL (ref 30.0–36.0)
MCV: 87.6 fl (ref 78.0–100.0)
Monocytes Absolute: 0.6 10*3/uL (ref 0.1–1.0)
Monocytes Relative: 7.3 % (ref 3.0–12.0)
Neutro Abs: 5.7 10*3/uL (ref 1.4–7.7)
Neutrophils Relative %: 64.1 % (ref 43.0–77.0)
Platelets: 230 10*3/uL (ref 150.0–400.0)
RBC: 4.75 Mil/uL (ref 4.22–5.81)
RDW: 14.4 % (ref 11.5–15.5)
WBC: 8.8 10*3/uL (ref 4.0–10.5)

## 2022-07-09 LAB — BASIC METABOLIC PANEL
BUN: 16 mg/dL (ref 6–23)
CO2: 24 mEq/L (ref 19–32)
Calcium: 9.4 mg/dL (ref 8.4–10.5)
Chloride: 104 mEq/L (ref 96–112)
Creatinine, Ser: 1 mg/dL (ref 0.40–1.50)
GFR: 76.36 mL/min (ref >60.00)
Glucose, Bld: 86 mg/dL (ref 70–99)
Potassium: 4.1 mEq/L (ref 3.5–5.1)
Sodium: 138 mEq/L (ref 135–145)

## 2022-07-09 LAB — TSH: TSH: 1.57 u[IU]/mL (ref 0.35–5.50)

## 2022-07-09 LAB — LIPID PANEL
Cholesterol: 84 mg/dL (ref 0–200)
HDL: 30.7 mg/dL — ABNORMAL LOW (ref >39.00)
LDL Cholesterol: 31 mg/dL (ref 0–99)
NonHDL: 52.95
Total CHOL/HDL Ratio: 3
Triglycerides: 108 mg/dL (ref 0.0–149.0)
VLDL: 21.6 mg/dL (ref 0.0–40.0)

## 2022-07-09 NOTE — Patient Instructions (Signed)

## 2022-07-09 NOTE — Progress Notes (Unsigned)
Subjective:  Patient ID: Leotis Pain, male    DOB: February 26, 1950  Age: 72 y.o. MRN: [id number]  CC: No chief complaint on file.   HPI Leotis Pain presents for ***  Outpatient Medications Prior to Visit  Medication Sig Dispense Refill   aspirin 81 MG EC tablet Take 1 tablet (81 mg total) by mouth daily. Swallow whole. 30 tablet 11   chlorhexidine (PERIDEX) 0.12 % solution Rinse with 1/2 oz 2 times daily for 30 seconds and spit out 473 mL 0   Empagliflozin-metFORMIN HCl (SYNJARDY) 5-500 MG TABS Take 1 tablet by mouth 2 (two) times daily. 180 tablet 0   nicotine (NICODERM CQ - DOSED IN MG/24 HOURS) 21 mg/24hr patch Place 1 patch (21 mg total) onto the skin daily. 28 patch 0   nitroGLYCERIN (NITROSTAT) 0.4 MG SL tablet Place 1 tablet (0.4 mg total) under the tongue every 5 (five) minutes x 3 doses as needed for chest pain. 25 tablet 3   olmesartan (BENICAR) 40 MG tablet Take 1 tablet (40 mg total) by mouth daily. 90 tablet 1   rosuvastatin (CRESTOR) 40 MG tablet Take 1 tablet (40 mg total) by mouth daily. 90 tablet 0   ticagrelor (BRILINTA) 90 MG TABS tablet Take 1 tablet (90 mg total) by mouth 2 (two) times daily. 180 tablet 3   No facility-administered medications prior to visit.    ROS Review of Systems  Objective:  BP 138/80 (BP Location: Right Arm, Patient Position: Sitting, Cuff Size: Large)   Pulse 79   Temp 98.1 F (36.7 C) (Oral)   Ht 5\' 11"  (1.803 m)   Wt 203 lb (92.1 kg)   SpO2 97%   BMI 28.31 kg/m   BP Readings from Last 3 Encounters:  07/09/22 138/80  03/27/22 134/70  02/11/22 (!) 160/86    Wt Readings from Last 3 Encounters:  07/09/22 203 lb (92.1 kg)  03/27/22 217 lb (98.4 kg)  02/11/22 220 lb (99.8 kg)    Physical Exam  Lab Results  Component Value Date   WBC 8.8 07/09/2022   HGB 13.9 07/09/2022   HCT 41.6 07/09/2022   PLT 230.0 07/09/2022   GLUCOSE 86 07/09/2022   CHOL 84 07/09/2022   TRIG 108.0 07/09/2022   HDL 30.70 (L) 07/09/2022    LDLCALC 31 07/09/2022   ALT 22 02/11/2022   AST 20 02/11/2022   NA 138 07/09/2022   K 4.1 07/09/2022   CL 104 07/09/2022   CREATININE 1.00 07/09/2022   BUN 16 07/09/2022   CO2 24 07/09/2022   TSH 1.57 07/09/2022   PSA 0.36 02/11/2022   HGBA1C 8.1 (H) 02/11/2022   MICROALBUR 0.7 02/11/2022    CARDIAC CATHETERIZATION  Result Date: 04/23/2021   Prox LAD lesion is 80% stenosed.   A drug-eluting stent was successfully placed using a STENT ONYX FRONTIER 3.5X18, postdilated to 3.8 mm and optimized with intravascular ultrasound.   Mid Cx lesion is 50% stenosed.  Cross-sectional area 4.57 mm by intravascular ultrasound.   Post intervention, there is a 0% residual stenosis.   The left ventricular systolic function is normal.   LV end diastolic pressure is normal.   The left ventricular ejection fraction is 55-65% by visual estimate.   There is no aortic valve stenosis. Severe LAD disease.  This was successfully treated with a drug-eluting stent as noted above.  Continue dual antiplatelet therapy for 12 months.  Consider clopidogrel monotherapy after 12 months. Moderate circumflex disease.  Adequate cross-sectional area.  Continue aggressive secondary prevention.   ECHOCARDIOGRAM COMPLETE  Result Date: 04/23/2021    ECHOCARDIOGRAM REPORT   Patient Name:   DELMUS SCHLENDER Date of Exam: 04/23/2021 Medical Rec #:  [id number]        Height:       71.0 in Accession #:    [id number]       Weight:       209.9 lb Date of Birth:  04-14-1950        BSA:          2.152 m Patient Age:    70 years         BP:           110/66 mmHg Patient Gender: M                HR:           66 bpm. Exam Location:  Inpatient Procedure: 2D Echo, 3D Echo, Cardiac Doppler, Color Doppler and Intracardiac            Opacification Agent Indications:    122-I22.9 Subsequent ST elevation (STEM) and non-ST elevation                 (NSTEMI) myocardial infarction  History:        Patient has prior history of Echocardiogram examinations, most                  recent 11/18/2013. Signs/Symptoms:Dizziness/Lightheadedness;                 Risk Factors:Hypertension, Diabetes, Dyslipidemia and Current                 Smoker.  Sonographer:    Sheralyn Boatman RDCS Referring Phys: 15 DAYNA N DUNN  Sonographer Comments: Technically difficult study due to poor echo windows. IMPRESSIONS  1. Left ventricular ejection fraction, by estimation, is 60 to 65%. The left ventricle has normal function. The left ventricle has no regional wall motion abnormalities. There is mild left ventricular hypertrophy. Left ventricular diastolic parameters were normal.  2. Right ventricular systolic function is normal. The right ventricular size is normal.  3. The mitral valve is normal in structure. No evidence of mitral valve regurgitation. No evidence of mitral stenosis.  4. The aortic valve is tricuspid. Aortic valve regurgitation is not visualized. Aortic valve sclerosis is present, with no evidence of aortic valve stenosis.  5. Aortic dilatation noted. There is borderline dilatation of the aortic root, measuring 38 mm. There is mild dilatation of the ascending aorta, measuring 42 mm.  6. The inferior vena cava is normal in size with greater than 50% respiratory variability, suggesting right atrial pressure of 3 mmHg. Comparison(s): Prior images unable to be directly viewed. FINDINGS  Left Ventricle: Left ventricular ejection fraction, by estimation, is 60 to 65%. The left ventricle has normal function. The left ventricle has no regional wall motion abnormalities. Definity contrast agent was given IV to delineate the left ventricular  endocardial borders. The left ventricular internal cavity size was normal in size. There is mild left ventricular hypertrophy. Left ventricular diastolic parameters were normal. Right Ventricle: The right ventricular size is normal. Right ventricular systolic function is normal. Left Atrium: Left atrial size was normal in size. Right Atrium: Right atrial size was  normal in size. Pericardium: There is no evidence of pericardial effusion. Mitral Valve: The mitral valve is normal in structure. No evidence of mitral valve regurgitation. No evidence of mitral valve stenosis. Tricuspid  Valve: The tricuspid valve is normal in structure. Tricuspid valve regurgitation is trivial. No evidence of tricuspid stenosis. Aortic Valve: The aortic valve is tricuspid. Aortic valve regurgitation is not visualized. Aortic valve sclerosis is present, with no evidence of aortic valve stenosis. Pulmonic Valve: The pulmonic valve was not well visualized. Pulmonic valve regurgitation is not visualized. No evidence of pulmonic stenosis. Aorta: Aortic dilatation noted. There is borderline dilatation of the aortic root, measuring 38 mm. There is mild dilatation of the ascending aorta, measuring 42 mm. Venous: The inferior vena cava is normal in size with greater than 50% respiratory variability, suggesting right atrial pressure of 3 mmHg. IAS/Shunts: No atrial level shunt detected by color flow Doppler.  LEFT VENTRICLE PLAX 2D LVIDd:         3.60 cm     Diastology LVIDs:         2.45 cm     LV e' medial:    8.27 cm/s LV PW:         1.40 cm     LV E/e' medial:  10.0 LV IVS:        1.00 cm     LV e' lateral:   8.81 cm/s LVOT diam:     2.40 cm     LV E/e' lateral: 9.3 LV SV:         82 LV SV Index:   38 LVOT Area:     4.52 cm  LV Volumes (MOD) LV vol d, MOD A2C: 87.8 ml LV vol d, MOD A4C: 70.4 ml LV vol s, MOD A2C: 27.6 ml LV vol s, MOD A4C: 29.3 ml LV SV MOD A2C:     60.2 ml LV SV MOD A4C:     70.4 ml LV SV MOD BP:      51.4 ml RIGHT VENTRICLE             IVC RV S prime:     14.00 cm/s  IVC diam: 2.10 cm TAPSE (M-mode): 2.3 cm LEFT ATRIUM             Index        RIGHT ATRIUM           Index LA diam:        3.50 cm 1.63 cm/m   RA Area:     11.20 cm LA Vol (A2C):   32.7 ml 15.19 ml/m  RA Volume:   22.40 ml  10.41 ml/m LA Vol (A4C):   26.1 ml 12.13 ml/m LA Biplane Vol: 31.4 ml 14.59 ml/m  AORTIC  VALVE LVOT Vmax:   98.30 cm/s LVOT Vmean:  64.350 cm/s LVOT VTI:    0.182 m  AORTA Ao Root diam: 3.80 cm Ao Asc diam:  4.15 cm MITRAL VALVE MV Area (PHT): 3.72 cm    SHUNTS MV Decel Time: 204 msec    Systemic VTI:  0.18 m MV E velocity: 82.30 cm/s  Systemic Diam: 2.40 cm MV A velocity: 81.80 cm/s MV E/A ratio:  1.01 Olga Millers MD Electronically signed by Olga Millers MD Signature Date/Time: 04/23/2021/9:50:51 AM    Final    DG Chest 2 View  Result Date: 04/22/2021 CLINICAL DATA:  Chest pain EXAM: CHEST - 2 VIEW COMPARISON:  CT 03/22/2021 FINDINGS: The heart size and mediastinal contours are within normal limits. Both lungs are clear. The visualized skeletal structures are unremarkable. IMPRESSION: No active cardiopulmonary disease. Electronically Signed   By: Signa Kell M.D.   On: 04/22/2021 10:13  Assessment & Plan:  Aortic atherosclerosis (HCC) -     Lipid panel; Future  Malignant hypertension -     TSH; Future -     CBC with Differential/Platelet; Future -     Basic metabolic panel; Future  Tobacco abuse -     Ambulatory Referral for Lung Cancer Scre  Hyperlipidemia associated with type 2 diabetes mellitus (HCC) -     Lipid panel; Future -     TSH; Future  Hypertension associated with diabetes (HCC) -     CBC with Differential/Platelet; Future -     Basic metabolic panel; Future     Follow-up: Return in about 6 months (around 01/08/2023).  Sanda Linger, MD

## 2022-07-10 LAB — HEMOGLOBIN A1C: Hgb A1c MFr Bld: 7.1 % — ABNORMAL HIGH (ref 4.6–6.5)

## 2022-08-09 ENCOUNTER — Telehealth: Payer: Self-pay | Admitting: Internal Medicine

## 2022-08-09 ENCOUNTER — Other Ambulatory Visit (HOSPITAL_COMMUNITY): Payer: Self-pay

## 2022-08-09 ENCOUNTER — Other Ambulatory Visit: Payer: Self-pay | Admitting: Physician Assistant

## 2022-08-09 MED ORDER — TICAGRELOR 90 MG PO TABS
90.0000 mg | ORAL_TABLET | Freq: Two times a day (BID) | ORAL | 3 refills | Status: DC
  Filled 2022-08-09: qty 180, 90d supply, fill #0

## 2022-08-09 MED ORDER — TICAGRELOR 90 MG PO TABS
90.0000 mg | ORAL_TABLET | Freq: Two times a day (BID) | ORAL | 2 refills | Status: DC
  Filled 2022-08-09: qty 180, 90d supply, fill #0

## 2022-08-09 NOTE — Telephone Encounter (Signed)
Pt's medication was sent to pt's pharmacy as requested. Confirmation received.  °

## 2022-08-09 NOTE — Telephone Encounter (Signed)
*  STAT* If patient is at the pharmacy, call can be transferred to refill team.   1. Which medications need to be refilled? (please list name of each medication and dose if known) ticagrelor (BRILINTA) 90 MG TABS tablet  2. Which pharmacy/location (including street and city if local pharmacy) is medication to be sent to? [place] [facility]  3. Do they need a 30 day or 90 day supply?  90 day supply

## 2022-08-12 ENCOUNTER — Other Ambulatory Visit (HOSPITAL_COMMUNITY): Payer: Self-pay

## 2022-09-02 ENCOUNTER — Other Ambulatory Visit (HOSPITAL_COMMUNITY): Payer: Self-pay

## 2022-09-02 ENCOUNTER — Other Ambulatory Visit: Payer: Self-pay | Admitting: Internal Medicine

## 2022-09-02 DIAGNOSIS — I152 Hypertension secondary to endocrine disorders: Secondary | ICD-10-CM

## 2022-09-02 MED ORDER — ROSUVASTATIN CALCIUM 40 MG PO TABS
40.0000 mg | ORAL_TABLET | Freq: Every day | ORAL | 1 refills | Status: DC
  Filled 2022-09-02 – 2022-10-31 (×2): qty 90, 90d supply, fill #0

## 2022-09-02 MED ORDER — OLMESARTAN MEDOXOMIL 40 MG PO TABS
40.0000 mg | ORAL_TABLET | Freq: Every day | ORAL | 1 refills | Status: DC
Start: 2022-09-02 — End: 2023-04-16
  Filled 2022-09-02: qty 90, 90d supply, fill #0

## 2022-09-05 ENCOUNTER — Other Ambulatory Visit: Payer: Self-pay

## 2022-09-05 DIAGNOSIS — Z87891 Personal history of nicotine dependence: Secondary | ICD-10-CM

## 2022-09-05 DIAGNOSIS — Z122 Encounter for screening for malignant neoplasm of respiratory organs: Secondary | ICD-10-CM

## 2022-09-05 DIAGNOSIS — F17210 Nicotine dependence, cigarettes, uncomplicated: Secondary | ICD-10-CM

## 2022-09-12 ENCOUNTER — Other Ambulatory Visit (HOSPITAL_COMMUNITY): Payer: Self-pay

## 2022-10-23 ENCOUNTER — Encounter: Payer: Self-pay | Admitting: Internal Medicine

## 2022-10-23 ENCOUNTER — Ambulatory Visit: Payer: Medicare HMO | Attending: Internal Medicine | Admitting: Internal Medicine

## 2022-10-23 VITALS — BP 122/70 | HR 82 | Ht 71.0 in | Wt 194.6 lb

## 2022-10-23 DIAGNOSIS — I700 Atherosclerosis of aorta: Secondary | ICD-10-CM | POA: Diagnosis not present

## 2022-10-23 DIAGNOSIS — Z720 Tobacco use: Secondary | ICD-10-CM | POA: Diagnosis not present

## 2022-10-23 DIAGNOSIS — I2510 Atherosclerotic heart disease of native coronary artery without angina pectoris: Secondary | ICD-10-CM

## 2022-10-23 DIAGNOSIS — I77810 Thoracic aortic ectasia: Secondary | ICD-10-CM | POA: Diagnosis not present

## 2022-10-23 NOTE — Patient Instructions (Signed)
Medication Instructions:  Your physician has recommended you make the following change in your medication:  STOP Brilinta once your bottle is finished.  *If you need a refill on your cardiac medications before your next appointment, please call your pharmacy*   Lab Work: NONE If you have labs (blood work) drawn today and your tests are completely normal, you will receive your results only by: MyChart Message (if you have MyChart) OR A paper copy in the mail If you have any lab test that is abnormal or we need to change your treatment, we will call you to review the results.   Testing/Procedures: NONE   Follow-Up: At [facility], you and your health needs are our priority.  As part of our continuing mission to provide you with exceptional heart care, we have created designated Provider Care Teams.  These Care Teams include your primary Cardiologist (physician) and Advanced Practice Providers (APPs -  Physician Assistants and Nurse Practitioners) who all work together to provide you with the care you need, when you need it.  We recommend signing up for the patient portal called "MyChart".  Sign up information is provided on this After Visit Summary.  MyChart is used to connect with patients for Virtual Visits (Telemedicine).  Patients are able to view lab/test results, encounter notes, upcoming appointments, etc.  Non-urgent messages can be sent to your provider as well.   To learn more about what you can do with MyChart, go to [email or web address].    Your next appointment:   1 year(s)  Provider:   Riley Lam, MD

## 2022-10-23 NOTE — Progress Notes (Signed)
Cardiology Office Note:    Date:  10/23/2022   ID:  Allen Whitehead, DOB 1950/05/20, MRN [id number]  PCP:  Etta Grandchild, MD   [facility] Providers Cardiologist:  Christell Constant, MD     Referring MD: Etta Grandchild, MD   CC: f/u NSTEMI  History of Present Illness:    Allen Whitehead is a 72 y.o. male with a hx of HTN with DM, RA, Tobacco abuse, Elevated plasma metanephrines NOS (unable to get further evaluation with insurance issues), CAC and aortic atherosclerosis, Mild Aortic dilation  seen initially for NSTEMI 2023: had PCI, BB stopped for nocturnal HB, has hypotension on high dose benicar; this was decreased to daily.   2024: Had dental work with no issues.  Still smoking.    Discussed the use of AI scribe software for clinical note transcription with the patient, who gave verbal consent to proceed.  History of Present Illness          Allen Whitehead, a patient with a history of obstructive coronary artery disease, presents for a follow-up visit. His last intervention was in April 2023, when he had a drug-eluting stent placed in his proximal LAD. He has also been diagnosed with asymptomatic SVT, mild aortic dilation, and nocturnal tubulin heart block, none of which have required procedural intervention. His cholesterol has been well controlled, but he continues to smoke.  In the past year, Allen Whitehead underwent dental work, which resulted in significant weight loss due to decreased appetite. He reports feeling well overall, with no chest Whitehead or breathing difficulties. He has not needed to use nitroglycerin. Despite financial struggles and ongoing stress, Allen Whitehead maintains a positive outlook and continues to work, which provides him with some level of physical activity.   Past Medical History:  Diagnosis Date   Anxiety    Arthritis    CAD (coronary artery disease), native coronary artery    04/23/21: 80% pLAD-DES, 50% Cx treated medically   Depression     Diabetes mellitus without complication (HCC)    Elevated plasma metanephrines    GERD (gastroesophageal reflux disease)    past hx    Hyperlipidemia with target LDL less than 70    Hypertension    Kidney stones     Past Surgical History:  Procedure Laterality Date   BIOPSY  02/15/2020   Procedure: BIOPSY;  Surgeon: Napoleon Form, MD;  Location: [facility];  Service: Endoscopy;;   COLONOSCOPY     age 61 for blood in stool- Normal    COLONOSCOPY WITH PROPOFOL N/A 02/15/2020   Procedure: COLONOSCOPY WITH PROPOFOL;  Surgeon: Napoleon Form, MD;  Location: [facility];  Service: Endoscopy;  Laterality: N/A;   CORONARY STENT INTERVENTION N/A 04/23/2021   Procedure: CORONARY STENT INTERVENTION;  Surgeon: Corky Crafts, MD;  Location: [facility] INVASIVE CV LAB;  Service: Cardiovascular;  Laterality: N/A;   ENDOSCOPIC MUCOSAL RESECTION  02/15/2020   Procedure: ENDOSCOPIC MUCOSAL RESECTION;  Surgeon: Napoleon Form, MD;  Location: [facility];  Service: Endoscopy;;   kidney stone retrieval     lasar treatment on [place]     LEFT HEART CATH AND CORONARY ANGIOGRAPHY N/A 04/23/2021   Procedure: LEFT HEART CATH AND CORONARY ANGIOGRAPHY;  Surgeon: Corky Crafts, MD;  Location: [facility] INVASIVE CV LAB;  Service: Cardiovascular;  Laterality: N/A;   POLYPECTOMY  02/15/2020   Procedure: POLYPECTOMY;  Surgeon: Napoleon Form, MD;  Location: [facility];  Service: Endoscopy;;   SUBMUCOSAL  LIFTING INJECTION  02/15/2020   Procedure: SUBMUCOSAL LIFTING INJECTION;  Surgeon: Napoleon Form, MD;  Location: [facility];  Service: Endoscopy;;   VASECTOMY      Current Medications: Current Meds  Medication Sig   aspirin 81 MG EC tablet Take 1 tablet (81 mg total) by mouth daily. Swallow whole.   Empagliflozin-metFORMIN HCl (SYNJARDY) 5-500 MG TABS Take 1 tablet by mouth 2 (two) times daily.   nitroGLYCERIN (NITROSTAT) 0.4 MG SL tablet Place 1 tablet (0.4 mg total) under the tongue every  5 (five) minutes x 3 doses as needed for chest Whitehead.   olmesartan (BENICAR) 40 MG tablet Take 1 tablet (40 mg total) by mouth daily.   rosuvastatin (CRESTOR) 40 MG tablet Take 1 tablet (40 mg total) by mouth daily.   [DISCONTINUED] ticagrelor (BRILINTA) 90 MG TABS tablet Take 1 tablet (90 mg total) by mouth 2 (two) times daily.     Allergies:   Patient has no known allergies.   Social History   Socioeconomic History   Marital status: Legally Separated    Spouse name: Not on file   Number of children: Not on file   Years of education: Not on file   Highest education level: Not on file  Occupational History   Not on file  Tobacco Use   Smoking status: Former    Current packs/day: 0.00    Average packs/day: 1 pack/day for 30.0 years (30.0 ttl pk-yrs)    Types: Cigarettes    Start date: 04/25/1991    Quit date: 04/24/2021    Years since quitting: 1.4    Passive exposure: Past   Smokeless tobacco: Never  Vaping Use   Vaping status: Never Used  Substance and Sexual Activity   Alcohol use: Yes    Alcohol/week: 2.0 standard drinks of alcohol    Types: 2 Cans of beer per week    Comment: occasionally   Drug use: No   Sexual activity: Yes    Partners: Female  Other Topics Concern   Not on file  Social History Narrative   Not on file   Social Determinants of Health   Financial Resource Strain: Low Risk  (12/11/2021)   Overall Financial Resource Strain (CARDIA)    Difficulty of Paying Living Expenses: Not hard at all  Food Insecurity: No Food Insecurity (12/11/2021)   Hunger Vital Sign    Worried About Running Out of Food in the Last Year: Never true    Ran Out of Food in the Last Year: Never true  Transportation Needs: No Transportation Needs (12/11/2021)   PRAPARE - Administrator, Civil Service (Medical): No    Lack of Transportation (Non-Medical): No  Physical Activity: Sufficiently Active (12/11/2021)   Exercise Vital Sign    Days of Exercise per Week: 7 days     Minutes of Exercise per Session: 60 min  Stress: No Stress Concern Present (12/11/2021)   [employer] of Occupational Health - Occupational Stress Questionnaire    Feeling of Stress : Not at all  Social Connections: Patient Declined (12/11/2021)   Social Connection and Isolation Panel [NHANES]    Frequency of Communication with Friends and Family: Patient declined    Frequency of Social Gatherings with Friends and Family: Patient declined    Attends Religious Services: Patient declined    Database administrator or Organizations: Patient declined    Attends Banker Meetings: Patient declined    Marital Status: Patient declined  Social: Has been married three times Now lives with his first ex-wife Renting a house from his third ex-wife Finances are tight 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 There is no history of Colon polyps, Esophageal cancer, Stomach cancer, or Rectal cancer.  ROS:   Please see the history of present illness.     EKGs/Labs/Other Studies Reviewed:    The following studies were reviewed today:  Cardiac Studies & Procedures   CARDIAC CATHETERIZATION  CARDIAC CATHETERIZATION 04/23/2021  Narrative   Prox LAD lesion is 80% stenosed.   A drug-eluting stent was successfully placed using a STENT ONYX FRONTIER 3.5X18, postdilated to 3.8 mm and optimized with intravascular ultrasound.   Mid Cx lesion is 50% stenosed.  Cross-sectional area 4.57 mm by intravascular ultrasound.   Post intervention, there is a 0% residual stenosis.   The left ventricular systolic function is normal.   LV end diastolic pressure is normal.   The  left ventricular ejection fraction is 55-65% by visual estimate.   There is no aortic valve stenosis.  Severe LAD disease.  This was successfully treated with a drug-eluting stent as noted above.  Continue dual antiplatelet therapy for 12 months.  Consider clopidogrel monotherapy after 12 months.  Moderate circumflex disease.  Adequate cross-sectional area.  Continue aggressive secondary prevention.  Findings Coronary Findings Diagnostic  Dominance: Right  Left Anterior Descending The vessel exhibits minimal luminal irregularities. Prox LAD lesion is 80% stenosed. Vessel is the culprit lesion.  Left Circumflex Vessel is large. Mid Cx lesion is 50% stenosed. Ultrasound (IVUS) was performed. Cross-sectional area: 4.57 mm.  Moderate plaque burden was detected. IVUS has determined that the lesion is heterogeneous.  Right Coronary Artery There is mild diffuse disease throughout the vessel.  Intervention  Prox LAD lesion Stent CATH LAUNCHER 6FR EBU3.5 guide catheter was inserted. Lesion crossed with guidewire using a WIRE ASAHI PROWATER 180CM. Pre-stent angioplasty was not performed. A drug-eluting stent was successfully placed using a STENT ONYX FRONTIER 3.5X18. Stent strut is well apposed. Post-stent angioplasty was performed using a BALLN [place] EUPHORA RX [id number]. Post-Intervention Lesion Assessment The intervention was successful. Pre-interventional TIMI flow is 3. Post-intervention TIMI flow is 3. No complications occurred at this lesion. Ultrasound (IVUS) was performed on the lesion post PCI using a CATH OPTICROSS HD. Stent well apposed. There is a 0% residual stenosis post intervention.     ECHOCARDIOGRAM  ECHOCARDIOGRAM COMPLETE 04/23/2021  Narrative ECHOCARDIOGRAM REPORT    Patient Name:   WHITNEY BINGAMAN Date of Exam: 04/23/2021 Medical Rec #:  [id number]        Height:       71.0 in Accession #:    [id number]       Weight:       209.9 lb Date of Birth:  March 17, 1950         BSA:          2.152 m Patient Age:    71 years         BP:           110/66 mmHg Patient Gender: M                HR:  66 bpm. Exam Location:  Inpatient  Procedure: 2D Echo, 3D Echo, Cardiac Doppler, Color Doppler and Intracardiac Opacification Agent  Indications:    122-I22.9 Subsequent ST elevation (STEM) and non-ST elevation (NSTEMI) myocardial infarction  History:        Patient has prior history of Echocardiogram examinations, most recent 11/18/2013. Signs/Symptoms:Dizziness/Lightheadedness; Risk Factors:Hypertension, Diabetes, Dyslipidemia and Current Smoker.  Sonographer:    Sheralyn Boatman RDCS Referring Phys: 18 DAYNA N DUNN   Sonographer Comments: Technically difficult study due to poor echo windows. IMPRESSIONS   1. Left ventricular ejection fraction, by estimation, is 60 to 65%. The left ventricle has normal function. The left ventricle has no regional wall motion abnormalities. There is mild left ventricular hypertrophy. Left ventricular diastolic parameters were normal. 2. Right ventricular systolic function is normal. The right ventricular size is normal. 3. The mitral valve is normal in structure. No evidence of mitral valve regurgitation. No evidence of mitral stenosis. 4. The aortic valve is tricuspid. Aortic valve regurgitation is not visualized. Aortic valve sclerosis is present, with no evidence of aortic valve stenosis. 5. Aortic dilatation noted. There is borderline dilatation of the aortic root, measuring 38 mm. There is mild dilatation of the ascending aorta, measuring 42 mm. 6. The inferior vena cava is normal in size with greater than 50% respiratory variability, suggesting right atrial pressure of 3 mmHg.  Comparison(s): Prior images unable to be directly viewed.  FINDINGS Left Ventricle: Left ventricular ejection fraction, by estimation, is 60 to 65%. The left ventricle has normal function. The left ventricle has no regional wall motion  abnormalities. Definity contrast agent was given IV to delineate the left ventricular endocardial borders. The left ventricular internal cavity size was normal in size. There is mild left ventricular hypertrophy. Left ventricular diastolic parameters were normal.  Right Ventricle: The right ventricular size is normal. Right ventricular systolic function is normal.  Left Atrium: Left atrial size was normal in size.  Right Atrium: Right atrial size was normal in size.  Pericardium: There is no evidence of pericardial effusion.  Mitral Valve: The mitral valve is normal in structure. No evidence of mitral valve regurgitation. No evidence of mitral valve stenosis.  Tricuspid Valve: The tricuspid valve is normal in structure. Tricuspid valve regurgitation is trivial. No evidence of tricuspid stenosis.  Aortic Valve: The aortic valve is tricuspid. Aortic valve regurgitation is not visualized. Aortic valve sclerosis is present, with no evidence of aortic valve stenosis.  Pulmonic Valve: The pulmonic valve was not well visualized. Pulmonic valve regurgitation is not visualized. No evidence of pulmonic stenosis.  Aorta: Aortic dilatation noted. There is borderline dilatation of the aortic root, measuring 38 mm. There is mild dilatation of the ascending aorta, measuring 42 mm.  Venous: The inferior vena cava is normal in size with greater than 50% respiratory variability, suggesting right atrial pressure of 3 mmHg.  IAS/Shunts: No atrial level shunt detected by color flow Doppler.   LEFT VENTRICLE PLAX 2D LVIDd:         3.60 cm     Diastology LVIDs:         2.45 cm     LV e' medial:    8.27 cm/s LV PW:         1.40 cm     LV E/e' medial:  10.0 LV IVS:        1.00 cm     LV e' lateral:   8.81 cm/s LVOT diam:     2.40 cm  LV E/e' lateral: 9.3 LV SV:         82 LV SV Index:   38 LVOT Area:     4.52 cm  LV Volumes (MOD) LV vol d, MOD A2C: 87.8 ml LV vol d, MOD A4C: 70.4 ml LV vol s, MOD  A2C: 27.6 ml LV vol s, MOD A4C: 29.3 ml LV SV MOD A2C:     60.2 ml LV SV MOD A4C:     70.4 ml LV SV MOD BP:      51.4 ml  RIGHT VENTRICLE             IVC RV S prime:     14.00 cm/s  IVC diam: 2.10 cm TAPSE (M-mode): 2.3 cm  LEFT ATRIUM             Index        RIGHT ATRIUM           Index LA diam:        3.50 cm 1.63 cm/m   RA Area:     11.20 cm LA Vol (A2C):   32.7 ml 15.19 ml/m  RA Volume:   22.40 ml  10.41 ml/m LA Vol (A4C):   26.1 ml 12.13 ml/m LA Biplane Vol: 31.4 ml 14.59 ml/m AORTIC VALVE LVOT Vmax:   98.30 cm/s LVOT Vmean:  64.350 cm/s LVOT VTI:    0.182 m  AORTA Ao Root diam: 3.80 cm Ao Asc diam:  4.15 cm  MITRAL VALVE MV Area (PHT): 3.72 cm    SHUNTS MV Decel Time: 204 msec    Systemic VTI:  0.18 m MV E velocity: 82.30 cm/s  Systemic Diam: 2.40 cm MV A velocity: 81.80 cm/s MV E/A ratio:  1.01  Olga Millers MD Electronically signed by Olga Millers MD Signature Date/Time: 04/23/2021/9:50:51 AM    Final    MONITORS  LONG TERM MONITOR (3-14 DAYS) 08/02/2021  Narrative   Patient had a minimum heart rate of 23 bpm, maximum heart rate of 197 bpm, and average heart rate of 84 bpm.   Predominant underlying rhythm was sinus rhythm.   Paroxysmal SVT lasting 17 seconds at longest with a max rate of 197 bpm at fastest.   Isolated PACs were rare (<1.0%).   Isolated PVCs were rare (<1.0%).   Second Degree AV Block-Mobitz I (Wenckebach) was present.   No triggered and diary events.  Asymptomatic SVT with asymptomatic nocturnal bradycardia and Wenckebach heart block.              Recent Labs: 02/11/2022: ALT 22 07/09/2022: BUN 16; Creatinine, Ser 1.00; Hemoglobin 13.9; Platelets 230.0; Potassium 4.1; Sodium 138; TSH 1.57  Recent Lipid Panel    Component Value Date/Time   CHOL 84 07/09/2022 1522   CHOL 99 (L) 07/17/2021 0934   TRIG 108.0 07/09/2022 1522   HDL 30.70 (L) 07/09/2022 1522   HDL 42 07/17/2021 0934   CHOLHDL 3 07/09/2022 1522   VLDL 21.6  07/09/2022 1522   LDLCALC 31 07/09/2022 1522   LDLCALC 42 07/17/2021 0934        Physical Exam:    VS:  BP 122/70   Pulse 82   Ht 5\' 11"  (1.803 m)   Wt 194 lb 9.6 oz (88.3 kg)   SpO2 95%   BMI 27.14 kg/m     Wt Readings from Last 3 Encounters:  10/23/22 194 lb 9.6 oz (88.3 kg)  07/09/22 203 lb (92.1 kg)  03/27/22 217 lb (98.4 kg)  Gen: No distress    Neck: No JVD Cardiac: No Rubs or Gallops, no murmur, RRR +2 radial pulses Respiratory: Clear to auscultation bilaterally, normal effort, normal  respiratory rate GI: Soft, nontender, non-distended  MS: No  edema;  moves all extremities Integument: Skin feels warm Neuro:  At time of evaluation, alert and oriented to person/place/time/situation  Psych: Normal affect  ASSESSMENT:    1. Coronary artery disease involving native coronary artery of native heart without angina pectoris   2. Aortic atherosclerosis (HCC)   3. Tobacco abuse   4. Aortic root dilation (HCC)     PLAN:    Coronary Artery Disease - Asymptomatic since drug-eluting stent placement in proximal LAD on 04/22/2021. No chest Whitehead or dyspnea reported. -Discontinue Brilinta after current supply runs out, continue aspirin monotherapy.  Tobacco Use - Continued smoking despite previous cessation attempts. Discussed the importance of smoking cessation for cardiovascular health. -Encouraged to try smoking cessation methods again, including use of patches.  Aortic Dilation - Mild dilation noted in 2023, asymptomatic; WNL for BSA - finances are tight at this time; at next visit will order an echocardiogram for follow up  Supraventricular Tachycardia (SVT) - Asymptomatic, no intervention required. - no AV nodal therapy started  Nocturnal Mobitz Type II Heart Block - Asymptomatic, no intervention required.  Hyperlipidemia - Well controlled cholesterol levels. -Continue current lipid-lowering therapy.     One year with me  Medication Adjustments/Labs and  Tests Ordered: Current medicines are reviewed at length with the patient today.  Concerns regarding medicines are outlined above.  No orders of the defined types were placed in this encounter.  No orders of the defined types were placed in this encounter.   Patient Instructions  Medication Instructions:  Your physician has recommended you make the following change in your medication:  STOP Brilinta once your bottle is finished.  *If you need a refill on your cardiac medications before your next appointment, please call your pharmacy*   Lab Work: NONE If you have labs (blood work) drawn today and your tests are completely normal, you will receive your results only by: MyChart Message (if you have MyChart) OR A paper copy in the mail If you have any lab test that is abnormal or we need to change your treatment, we will call you to review the results.   Testing/Procedures: NONE   Follow-Up: At [facility], you and your health needs are our priority.  As part of our continuing mission to provide you with exceptional heart care, we have created designated Provider Care Teams.  These Care Teams include your primary Cardiologist (physician) and Advanced Practice Providers (APPs -  Physician Assistants and Nurse Practitioners) who all work together to provide you with the care you need, when you need it.  We recommend signing up for the patient portal called "MyChart".  Sign up information is provided on this After Visit Summary.  MyChart is used to connect with patients for Virtual Visits (Telemedicine).  Patients are able to view lab/test results, encounter notes, upcoming appointments, etc.  Non-urgent messages can be sent to your provider as well.   To learn more about what you can do with MyChart, go to [email or web address].    Your next appointment:   1 year(s)  Provider:   Riley Lam, MD       Signed, Christell Constant, MD  10/23/2022 4:22 PM     [facility]

## 2022-10-31 ENCOUNTER — Other Ambulatory Visit (HOSPITAL_COMMUNITY): Payer: Self-pay

## 2022-12-18 ENCOUNTER — Other Ambulatory Visit (HOSPITAL_COMMUNITY): Payer: Self-pay

## 2022-12-18 ENCOUNTER — Other Ambulatory Visit: Payer: Self-pay | Admitting: Internal Medicine

## 2022-12-18 DIAGNOSIS — E1159 Type 2 diabetes mellitus with other circulatory complications: Secondary | ICD-10-CM

## 2022-12-18 MED ORDER — SYNJARDY 5-500 MG PO TABS
1.0000 | ORAL_TABLET | Freq: Two times a day (BID) | ORAL | 0 refills | Status: DC
Start: 2022-12-18 — End: 2023-04-16
  Filled 2022-12-18: qty 60, 30d supply, fill #0

## 2022-12-20 ENCOUNTER — Other Ambulatory Visit (HOSPITAL_COMMUNITY): Payer: Self-pay

## 2023-01-03 DIAGNOSIS — E119 Type 2 diabetes mellitus without complications: Secondary | ICD-10-CM | POA: Diagnosis not present

## 2023-01-03 DIAGNOSIS — H2513 Age-related nuclear cataract, bilateral: Secondary | ICD-10-CM | POA: Diagnosis not present

## 2023-01-03 LAB — HM DIABETES EYE EXAM

## 2023-01-07 ENCOUNTER — Other Ambulatory Visit (HOSPITAL_COMMUNITY): Payer: Self-pay

## 2023-01-07 ENCOUNTER — Ambulatory Visit (INDEPENDENT_AMBULATORY_CARE_PROVIDER_SITE_OTHER): Payer: Medicare HMO | Admitting: Internal Medicine

## 2023-01-07 VITALS — BP 130/76 | HR 88 | Temp 97.6°F | Ht 71.0 in | Wt 196.0 lb

## 2023-01-07 DIAGNOSIS — E785 Hyperlipidemia, unspecified: Secondary | ICD-10-CM | POA: Diagnosis not present

## 2023-01-07 DIAGNOSIS — I2510 Atherosclerotic heart disease of native coronary artery without angina pectoris: Secondary | ICD-10-CM

## 2023-01-07 DIAGNOSIS — E1169 Type 2 diabetes mellitus with other specified complication: Secondary | ICD-10-CM

## 2023-01-07 DIAGNOSIS — I10 Essential (primary) hypertension: Secondary | ICD-10-CM

## 2023-01-07 DIAGNOSIS — E119 Type 2 diabetes mellitus without complications: Secondary | ICD-10-CM | POA: Diagnosis not present

## 2023-01-07 DIAGNOSIS — N400 Enlarged prostate without lower urinary tract symptoms: Secondary | ICD-10-CM

## 2023-01-07 LAB — CBC WITH DIFFERENTIAL/PLATELET
Basophils Absolute: 0.1 10*3/uL (ref 0.0–0.1)
Basophils Relative: 0.6 % (ref 0.0–3.0)
Eosinophils Absolute: 0.1 10*3/uL (ref 0.0–0.7)
Eosinophils Relative: 1.5 % (ref 0.0–5.0)
HCT: 43.2 % (ref 39.0–52.0)
Hemoglobin: 14.4 g/dL (ref 13.0–17.0)
Lymphocytes Relative: 30 % (ref 12.0–46.0)
Lymphs Abs: 2.4 10*3/uL (ref 0.7–4.0)
MCHC: 33.4 g/dL (ref 30.0–36.0)
MCV: 90.6 fL (ref 78.0–100.0)
Monocytes Absolute: 0.8 10*3/uL (ref 0.1–1.0)
Monocytes Relative: 10.2 % (ref 3.0–12.0)
Neutro Abs: 4.6 10*3/uL (ref 1.4–7.7)
Neutrophils Relative %: 57.7 % (ref 43.0–77.0)
Platelets: 232 10*3/uL (ref 150.0–400.0)
RBC: 4.77 Mil/uL (ref 4.22–5.81)
RDW: 14.6 % (ref 11.5–15.5)
WBC: 7.9 10*3/uL (ref 4.0–10.5)

## 2023-01-07 LAB — HEPATIC FUNCTION PANEL
ALT: 17 U/L (ref 0–53)
AST: 20 U/L (ref 0–37)
Albumin: 4.4 g/dL (ref 3.5–5.2)
Alkaline Phosphatase: 78 U/L (ref 39–117)
Bilirubin, Direct: 0.1 mg/dL (ref 0.0–0.3)
Total Bilirubin: 0.4 mg/dL (ref 0.2–1.2)
Total Protein: 7.2 g/dL (ref 6.0–8.3)

## 2023-01-07 LAB — BASIC METABOLIC PANEL
BUN: 16 mg/dL (ref 6–23)
CO2: 29 meq/L (ref 19–32)
Calcium: 9.8 mg/dL (ref 8.4–10.5)
Chloride: 104 meq/L (ref 96–112)
Creatinine, Ser: 1.21 mg/dL (ref 0.40–1.50)
GFR: 60.53 mL/min (ref >60.00)
Glucose, Bld: 80 mg/dL (ref 70–99)
Potassium: 4.6 meq/L (ref 3.5–5.1)
Sodium: 140 meq/L (ref 135–145)

## 2023-01-07 LAB — PSA: PSA: 0.45 ng/mL (ref 0.10–4.00)

## 2023-01-07 LAB — HEMOGLOBIN A1C: Hgb A1c MFr Bld: 6.9 % — ABNORMAL HIGH (ref 4.6–6.5)

## 2023-01-07 LAB — MICROALBUMIN / CREATININE URINE RATIO
Creatinine,U: 81.8 mg/dL
Microalb Creat Ratio: 0.9 mg/g (ref 0.0–30.0)
Microalb, Ur: <0.7 mg/dL (ref 0.0–1.9)

## 2023-01-07 MED ORDER — ASPIRIN 81 MG PO TBEC
81.0000 mg | DELAYED_RELEASE_TABLET | Freq: Every day | ORAL | 1 refills | Status: DC
  Filled 2023-01-07: qty 90, 90d supply, fill #0

## 2023-01-07 NOTE — Patient Instructions (Signed)

## 2023-01-07 NOTE — Progress Notes (Unsigned)
Subjective:  Patient ID: Allen Whitehead, male    DOB: [birth year]  Age: 72 y.o. MRN: [id number]  CC: Diabetes, Hypertension, and Coronary Artery Disease   HPI Allen Whitehead presents for f/up ---  Discussed the use of AI scribe software for clinical note transcription with the patient, who gave verbal consent to proceed.  History of Present Illness   The patient, with a history of vertigo, presents for a routine check-up. He reports feeling "pretty good" with no recent chest Whitehead, shortness of breath, or drastic changes in weight or appetite. He does note occasional dizziness, but it is not as severe as it used to be. He denies any symptoms of hyperglycemia such as excessive thirst or urination.  The patient has experienced a slight weight loss due to dental work which temporarily affected his appetite, but he reports that his appetite has since returned to normal. He has a history of shingles infection post-COVID vaccination but declines the shingles vaccine, as well as the flu, tetanus, and pneumonia vaccines.  He has a long-standing history of vertigo, dating back 15 years. He reports no recent episodes of vertigo.  The patient admits to smoking cigarettes and consuming alcohol, albeit very little. He reports no Whitehead in his calves when walking, but does experience occasional cramps at night.   The patient recently had an eye exam, which yielded good results. He is due for lab work, including blood and urine tests. He does not have a computer and prefers to receive his lab results by mail.       Outpatient Medications Prior to Visit  Medication Sig Dispense Refill   Empagliflozin-metFORMIN HCl (SYNJARDY) 5-500 MG TABS Take 1 tablet by mouth 2 (two) times daily. 180 tablet 0   nitroGLYCERIN (NITROSTAT) 0.4 MG SL tablet Place 1 tablet (0.4 mg total) under the tongue every 5 (five) minutes x 3 doses as needed for chest Whitehead. 25 tablet 3   olmesartan (BENICAR) 40 MG tablet Take 1 tablet  (40 mg total) by mouth daily. 90 tablet 1   rosuvastatin (CRESTOR) 40 MG tablet Take 1 tablet (40 mg total) by mouth daily. 90 tablet 1   aspirin 81 MG EC tablet Take 1 tablet (81 mg total) by mouth daily. Swallow whole. 30 tablet 11   No facility-administered medications prior to visit.    ROS Review of Systems  Constitutional:  Negative for appetite change, chills, diaphoresis, fatigue and fever.  HENT: Negative.    Eyes: Negative.   Respiratory:  Negative for cough, chest tightness, shortness of breath and wheezing.   Cardiovascular:  Negative for chest Whitehead, palpitations and leg swelling.  Gastrointestinal:  Negative for abdominal Whitehead, diarrhea, nausea and vomiting.  Endocrine: Negative.   Genitourinary: Negative.  Negative for difficulty urinating and dysuria.  Musculoskeletal:  Negative for arthralgias, joint swelling and myalgias.  Skin: Negative.   Neurological:  Negative for dizziness, weakness and numbness.  Hematological:  Negative for adenopathy. Does not bruise/bleed easily.  Psychiatric/Behavioral:  Positive for confusion and decreased concentration. Negative for dysphoric mood. The patient is not nervous/anxious.     Objective:  BP 130/76 (BP Location: Left Arm, Patient Position: Sitting)   Pulse 88   Temp 97.6 F (36.4 C) (Temporal)   Ht 5\' 11"  (1.803 m)   Wt 196 lb (88.9 kg)   SpO2 99%   BMI 27.34 kg/m   BP Readings from Last 3 Encounters:  01/07/23 130/76  10/23/22 122/70  07/09/22 138/80  Wt Readings from Last 3 Encounters:  01/07/23 196 lb (88.9 kg)  10/23/22 194 lb 9.6 oz (88.3 kg)  07/09/22 203 lb (92.1 kg)    Physical Exam Vitals reviewed.  Constitutional:      Appearance: Normal appearance.  HENT:     Mouth/Throat:     Mouth: Mucous membranes are moist.  Eyes:     General: No scleral icterus.    Conjunctiva/sclera: Conjunctivae normal.  Cardiovascular:     Rate and Rhythm: Normal rate and regular rhythm.     Heart sounds: Normal  heart sounds, S1 normal and S2 normal. No murmur heard.    No gallop.     Comments: EKG- NSR, 81 bpm No LVH, Q waves, or ST/T waves  Pulmonary:     Effort: Pulmonary effort is normal.     Breath sounds: No stridor. No wheezing, rhonchi or rales.  Abdominal:     General: Abdomen is flat.     Palpations: There is no mass.     Tenderness: There is no abdominal tenderness. There is no guarding.     Hernia: No hernia is present.  Musculoskeletal:     Cervical back: Neck supple.     Right lower leg: No edema.     Left lower leg: No edema.  Lymphadenopathy:     Cervical: No cervical adenopathy.  Skin:    General: Skin is warm and dry.  Neurological:     General: No focal deficit present.     Mental Status: He is alert. Mental status is at baseline.  Psychiatric:        Mood and Affect: Mood normal.        Behavior: Behavior normal.     Lab Results  Component Value Date   WBC 7.9 01/07/2023   HGB 14.4 01/07/2023   HCT 43.2 01/07/2023   PLT 232.0 01/07/2023   GLUCOSE 80 01/07/2023   CHOL 84 07/09/2022   TRIG 108.0 07/09/2022   HDL 30.70 (L) 07/09/2022   LDLCALC 31 07/09/2022   ALT 17 01/07/2023   AST 20 01/07/2023   NA 140 01/07/2023   K 4.6 01/07/2023   CL 104 01/07/2023   CREATININE 1.21 01/07/2023   BUN 16 01/07/2023   CO2 29 01/07/2023   TSH 1.57 07/09/2022   PSA 0.45 01/07/2023   HGBA1C 6.9 (H) 01/07/2023   MICROALBUR <0.7 01/07/2023    CARDIAC CATHETERIZATION Result Date: 04/23/2021   Prox LAD lesion is 80% stenosed.   A drug-eluting stent was successfully placed using a STENT ONYX FRONTIER 3.5X18, postdilated to 3.8 mm and optimized with intravascular ultrasound.   Mid Cx lesion is 50% stenosed.  Cross-sectional area 4.57 mm by intravascular ultrasound.   Post intervention, there is a 0% residual stenosis.   The left ventricular systolic function is normal.   LV end diastolic pressure is normal.   The left ventricular ejection fraction is 55-65% by visual  estimate.   There is no aortic valve stenosis. Severe LAD disease.  This was successfully treated with a drug-eluting stent as noted above.  Continue dual antiplatelet therapy for 12 months.  Consider clopidogrel monotherapy after 12 months. Moderate circumflex disease.  Adequate cross-sectional area. Continue aggressive secondary prevention.   ECHOCARDIOGRAM COMPLETE Result Date: 04/23/2021    ECHOCARDIOGRAM REPORT   Patient Name:   MAXAMUS PERIC Date of Exam: 04/23/2021 Medical Rec #:  [id number]        Height:       71.0 in Accession #:  [id number]       Weight:       209.9 lb Date of Birth:  04-Feb-1950        BSA:          2.152 m Patient Age:    44 years         BP:           110/66 mmHg Patient Gender: M                HR:           66 bpm. Exam Location:  Inpatient Procedure: 2D Echo, 3D Echo, Cardiac Doppler, Color Doppler and Intracardiac            Opacification Agent Indications:    122-I22.9 Subsequent ST elevation (STEM) and non-ST elevation                 (NSTEMI) myocardial infarction  History:        Patient has prior history of Echocardiogram examinations, most                 recent 11/18/2013. Signs/Symptoms:Dizziness/Lightheadedness;                 Risk Factors:Hypertension, Diabetes, Dyslipidemia and Current                 Smoker.  Sonographer:    Sheralyn Boatman RDCS Referring Phys: [id number] DAYNA N DUNN  Sonographer Comments: Technically difficult study due to poor echo windows. IMPRESSIONS  1. Left ventricular ejection fraction, by estimation, is 60 to 65%. The left ventricle has normal function. The left ventricle has no regional wall motion abnormalities. There is mild left ventricular hypertrophy. Left ventricular diastolic parameters were normal.  2. Right ventricular systolic function is normal. The right ventricular size is normal.  3. The mitral valve is normal in structure. No evidence of mitral valve regurgitation. No evidence of mitral stenosis.  4. The aortic valve is tricuspid.  Aortic valve regurgitation is not visualized. Aortic valve sclerosis is present, with no evidence of aortic valve stenosis.  5. Aortic dilatation noted. There is borderline dilatation of the aortic root, measuring 38 mm. There is mild dilatation of the ascending aorta, measuring 42 mm.  6. The inferior vena cava is normal in size with greater than 50% respiratory variability, suggesting right atrial pressure of 3 mmHg. Comparison(s): Prior images unable to be directly viewed. FINDINGS  Left Ventricle: Left ventricular ejection fraction, by estimation, is 60 to 65%. The left ventricle has normal function. The left ventricle has no regional wall motion abnormalities. Definity contrast agent was given IV to delineate the left ventricular  endocardial borders. The left ventricular internal cavity size was normal in size. There is mild left ventricular hypertrophy. Left ventricular diastolic parameters were normal. Right Ventricle: The right ventricular size is normal. Right ventricular systolic function is normal. Left Atrium: Left atrial size was normal in size. Right Atrium: Right atrial size was normal in size. Pericardium: There is no evidence of pericardial effusion. Mitral Valve: The mitral valve is normal in structure. No evidence of mitral valve regurgitation. No evidence of mitral valve stenosis. Tricuspid Valve: The tricuspid valve is normal in structure. Tricuspid valve regurgitation is trivial. No evidence of tricuspid stenosis. Aortic Valve: The aortic valve is tricuspid. Aortic valve regurgitation is not visualized. Aortic valve sclerosis is present, with no evidence of aortic valve stenosis. Pulmonic Valve: The pulmonic valve was not well visualized. Pulmonic valve regurgitation is not  visualized. No evidence of pulmonic stenosis. Aorta: Aortic dilatation noted. There is borderline dilatation of the aortic root, measuring 38 mm. There is mild dilatation of the ascending aorta, measuring 42 mm. Venous: The  inferior vena cava is normal in size with greater than 50% respiratory variability, suggesting right atrial pressure of 3 mmHg. IAS/Shunts: No atrial level shunt detected by color flow Doppler.  LEFT VENTRICLE PLAX 2D LVIDd:         3.60 cm     Diastology LVIDs:         2.45 cm     LV e' medial:    8.27 cm/s LV PW:         1.40 cm     LV E/e' medial:  10.0 LV IVS:        1.00 cm     LV e' lateral:   8.81 cm/s LVOT diam:     2.40 cm     LV E/e' lateral: 9.3 LV SV:         82 LV SV Index:   38 LVOT Area:     4.52 cm  LV Volumes (MOD) LV vol d, MOD A2C: 87.8 ml LV vol d, MOD A4C: 70.4 ml LV vol s, MOD A2C: 27.6 ml LV vol s, MOD A4C: 29.3 ml LV SV MOD A2C:     60.2 ml LV SV MOD A4C:     70.4 ml LV SV MOD BP:      51.4 ml RIGHT VENTRICLE             IVC RV S prime:     14.00 cm/s  IVC diam: 2.10 cm TAPSE (M-mode): 2.3 cm LEFT ATRIUM             Index        RIGHT ATRIUM           Index LA diam:        3.50 cm 1.63 cm/m   RA Area:     11.20 cm LA Vol (A2C):   32.7 ml 15.19 ml/m  RA Volume:   22.40 ml  10.41 ml/m LA Vol (A4C):   26.1 ml 12.13 ml/m LA Biplane Vol: 31.4 ml 14.59 ml/m  AORTIC VALVE LVOT Vmax:   98.30 cm/s LVOT Vmean:  64.350 cm/s LVOT VTI:    0.182 m  AORTA Ao Root diam: 3.80 cm Ao Asc diam:  4.15 cm MITRAL VALVE MV Area (PHT): 3.72 cm    SHUNTS MV Decel Time: 204 msec    Systemic VTI:  0.18 m MV E velocity: 82.30 cm/s  Systemic Diam: 2.40 cm MV A velocity: 81.80 cm/s MV E/A ratio:  1.01 Olga Millers MD Electronically signed by Olga Millers MD Signature Date/Time: 04/23/2021/9:50:51 AM    Final    DG Chest 2 View Result Date: 04/22/2021 CLINICAL DATA:  Chest Whitehead EXAM: CHEST - 2 VIEW COMPARISON:  CT 03/22/2021 FINDINGS: The heart size and mediastinal contours are within normal limits. Both lungs are clear. The visualized skeletal structures are unremarkable. IMPRESSION: No active cardiopulmonary disease. Electronically Signed   By: Signa Kell M.D.   On: 04/22/2021 10:13    Assessment &  Plan:   Malignant hypertension- EKG is negative for LVH. BP is well controlled. -     CBC with Differential/Platelet; Future -     Basic metabolic panel; Future -     EKG 12-Lead -     Urinalysis, Routine w reflex microscopic; Future  Benign prostatic  hyperplasia without lower urinary tract symptoms -     PSA; Future  Hyperlipidemia associated with type 2 diabetes mellitus (HCC) -     Hepatic function panel; Future  Diabetes mellitus without complication (HCC)- His blood sugar is well controlled. -     Hemoglobin A1c; Future -     HM Diabetes Foot Exam -     Microalbumin / creatinine urine ratio; Future -     Urinalysis, Routine w reflex microscopic; Future  Coronary artery disease involving native coronary artery of native heart without angina pectoris -     EKG 12-Lead -     Aspirin; Take 1 tablet (81 mg total) by mouth daily. Swallow whole.  Dispense: 90 tablet; Refill: 1     Follow-up: Return in about 4 months (around 05/08/2023).  Sanda Linger, MD

## 2023-01-08 ENCOUNTER — Telehealth: Payer: Self-pay

## 2023-01-08 ENCOUNTER — Encounter: Payer: Self-pay | Admitting: Internal Medicine

## 2023-01-08 LAB — URINALYSIS, ROUTINE W REFLEX MICROSCOPIC
Bilirubin Urine: NEGATIVE
Hgb urine dipstick: NEGATIVE
Ketones, ur: NEGATIVE
Leukocytes,Ua: NEGATIVE
Nitrite: NEGATIVE
RBC / HPF: NONE SEEN (ref 0–?)
Specific Gravity, Urine: 1.02 (ref 1.000–1.030)
Total Protein, Urine: NEGATIVE
Urine Glucose: >=1000 — AB
Urobilinogen, UA: 0.2 (ref 0.0–1.0)
WBC, UA: NONE SEEN (ref 0–?)
pH: 6 (ref 5.0–8.0)

## 2023-01-08 NOTE — Telephone Encounter (Signed)
Copied from CRM [id number]. Topic: General - Inquiry >> Jan 08, 2023 10:59 AM Mosetta Putt H wrote: Reason for CRM: Pt see that a appt scheduled for 1/15 he needs info on this appt and what is it in regards to

## 2023-01-09 ENCOUNTER — Encounter: Payer: Self-pay | Admitting: Internal Medicine

## 2023-01-09 NOTE — Telephone Encounter (Signed)
Advised patient that another office is who ordered this scan for him not Dr. Yetta Barre office. The patient wanted to go back and fourth with me in regards to another office making decision for him without speaking to Dr. Yetta Barre, I advised him that if he sees a specialist they do not have to reach out to Dr. Yetta Barre in regards to what they order for the patient. He made it very clear that he's going to cancel the appointment I advised him that that is totally up to him. He gave a verbal understanding.

## 2023-01-10 ENCOUNTER — Telehealth: Payer: Self-pay

## 2023-01-10 NOTE — Telephone Encounter (Signed)
Copied from CRM [id number]. Topic: Clinical - Lab/Test Results >> Jan 10, 2023  3:24 PM Clayton Bibles wrote:  Reason for CRM: Allen Whitehead has questions about lab results. 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 Please call him at [phone]

## 2023-01-16 ENCOUNTER — Telehealth: Payer: Self-pay | Admitting: Radiology

## 2023-01-16 NOTE — Telephone Encounter (Signed)
Unable to reach patient. LMTRC  

## 2023-01-16 NOTE — Telephone Encounter (Signed)
Copied from CRM [id number]. Topic: Clinical - Lab/Test Results >> Jan 16, 2023 11:27 AM [place] A wrote: Reason for CRM: Patient called because he has questions regarding his lab results. Call back number [phone]

## 2023-01-17 ENCOUNTER — Other Ambulatory Visit (HOSPITAL_COMMUNITY): Payer: Self-pay

## 2023-01-20 NOTE — Telephone Encounter (Signed)
Patient is concerned about his most recent labs he wants [place] to explain to his what stage 2 kidney disease is, also wants to know how was he diagnosed with it and all of his labs are normal. Requesting a call back or would you rather an appointment to discuss this with him ?

## 2023-01-21 NOTE — Telephone Encounter (Signed)
Patient has been made aware of Dr. Yetta Barre comments. I answered all of his questions, He gave a verbal understanding and states that he would call us back if he was confused about anything else.

## 2023-02-05 ENCOUNTER — Ambulatory Visit
Admission: RE | Admit: 2023-02-05 | Discharge: 2023-02-05 | Disposition: A | Discharge status: Home / Self Care | Payer: Medicare HMO | Source: Ambulatory Visit | Attending: Internal Medicine | Admitting: Internal Medicine

## 2023-02-05 DIAGNOSIS — Z87891 Personal history of nicotine dependence: Secondary | ICD-10-CM

## 2023-02-05 DIAGNOSIS — Z122 Encounter for screening for malignant neoplasm of respiratory organs: Secondary | ICD-10-CM

## 2023-02-05 DIAGNOSIS — F17210 Nicotine dependence, cigarettes, uncomplicated: Secondary | ICD-10-CM

## 2023-02-14 ENCOUNTER — Other Ambulatory Visit: Payer: Self-pay | Admitting: Acute Care

## 2023-02-14 DIAGNOSIS — Z87891 Personal history of nicotine dependence: Secondary | ICD-10-CM

## 2023-02-14 DIAGNOSIS — Z122 Encounter for screening for malignant neoplasm of respiratory organs: Secondary | ICD-10-CM

## 2023-02-14 DIAGNOSIS — F17210 Nicotine dependence, cigarettes, uncomplicated: Secondary | ICD-10-CM

## 2023-04-02 IMAGING — CT CT CHEST LUNG CANCER SCREENING LOW DOSE W/O CM
2 of 4 series · 15 of 36 positions shown, 18 images · non-contrast
Comparison: CT angio chest 11/13/2013

CLINICAL DATA: Lung cancer screening. Fifty pack-year history.
Current asymptomatic smoker.



[Series 3: lung thins 1.0 · axial · 0.81mm/px · z∈[-335,-38]mm · 12 of 327 slices shown, 15 images]
[im 15/327  mediastinal]
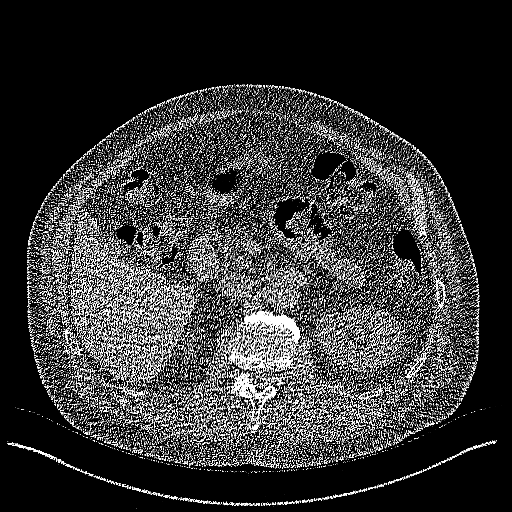
[im 15/327  lung]
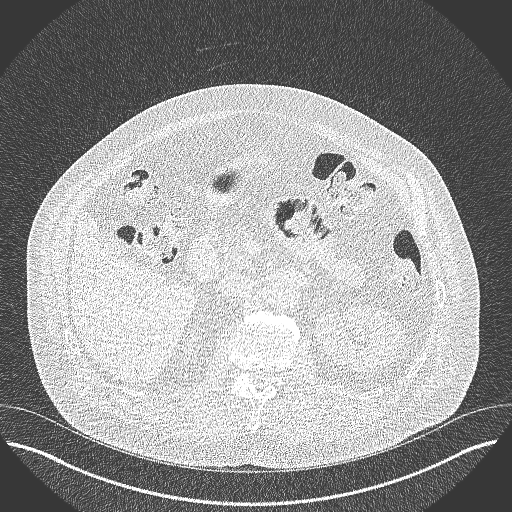
[im 45/327  lung]
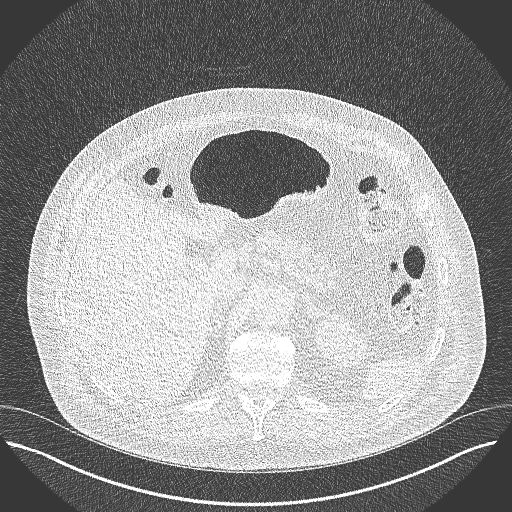
[im 75/327  lung]
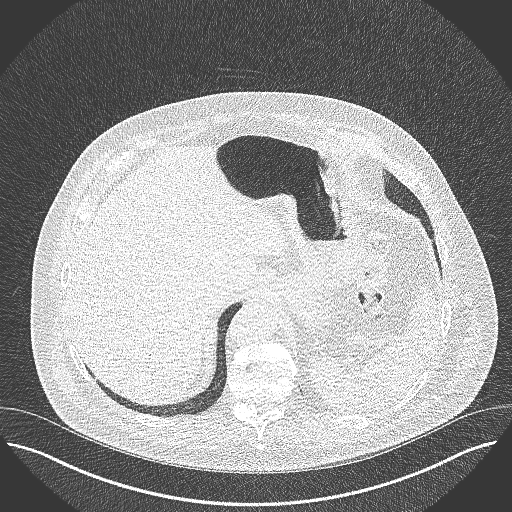
[im 104/327  lung]
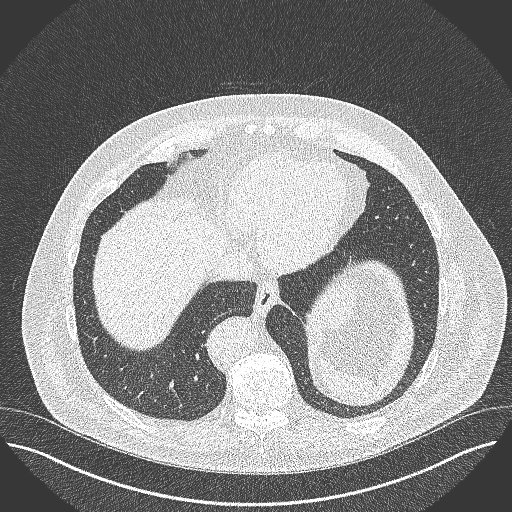
[im 119/327  mediastinal]
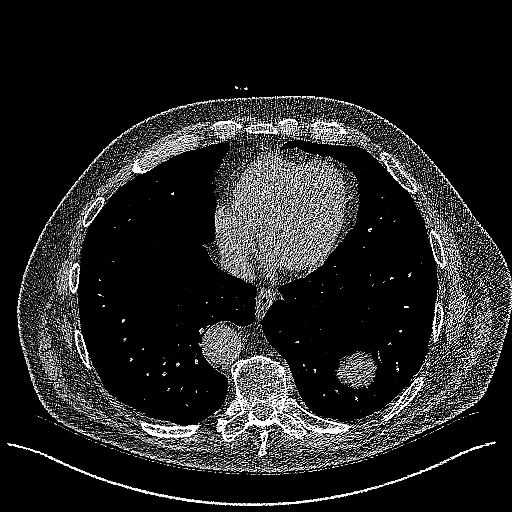
[im 119/327  lung]
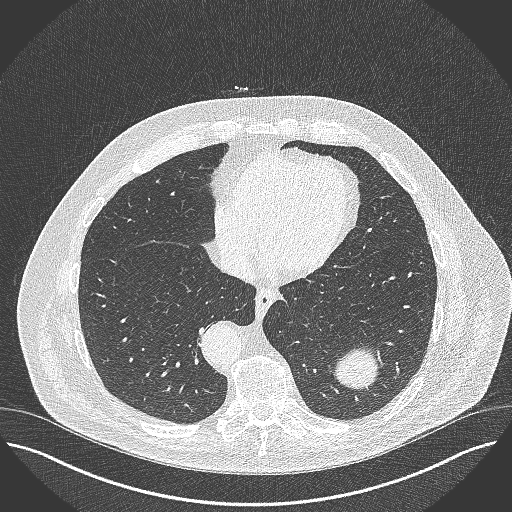
[im 149/327  lung]
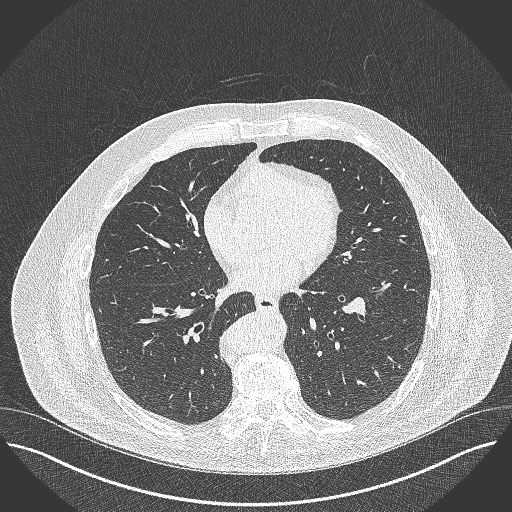
[im 178/327  lung]
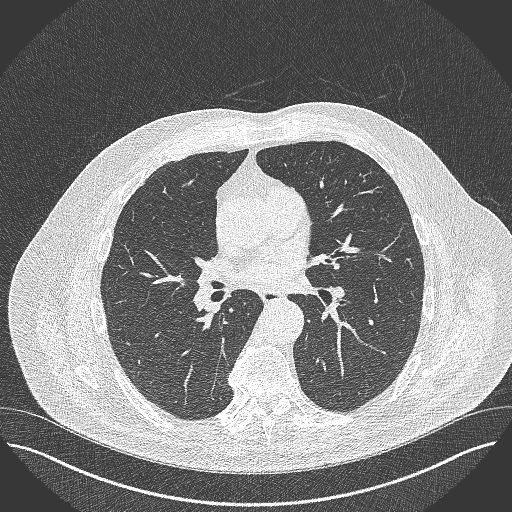
[im 208/327  lung]
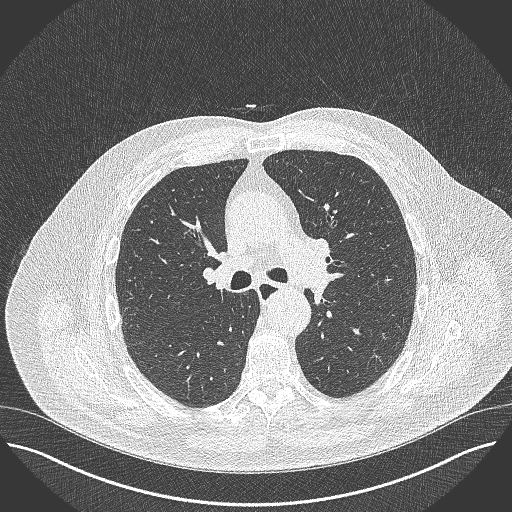
[im 223/327  mediastinal]
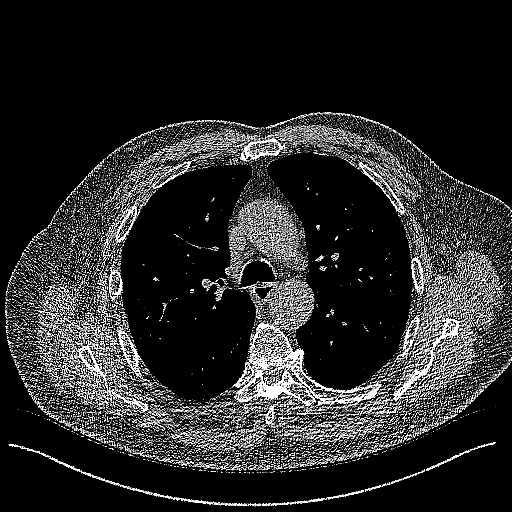
[im 223/327  lung]
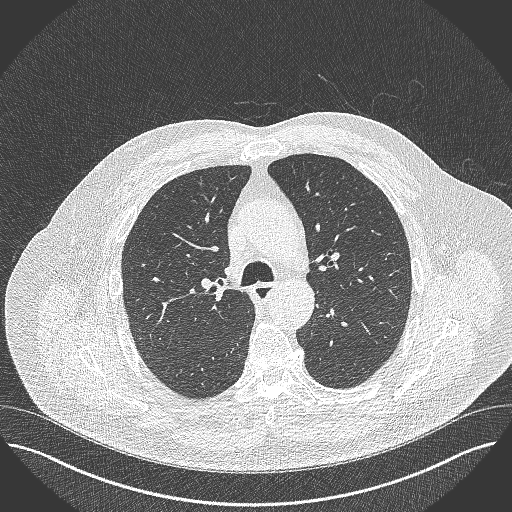
[im 252/327  lung]
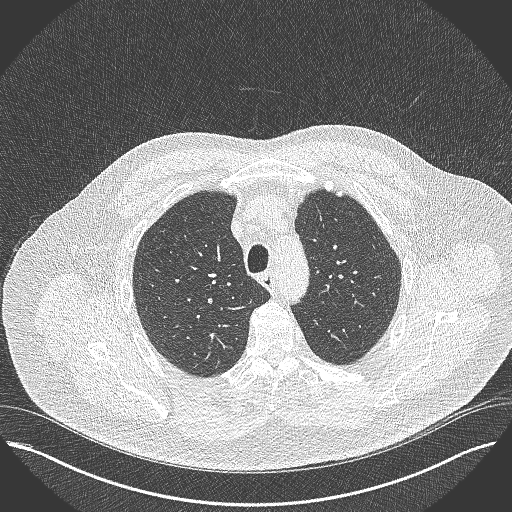
[im 282/327  lung]
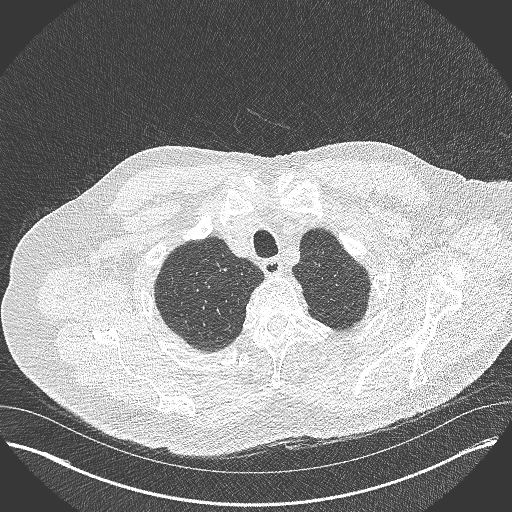
[im 312/327  lung]
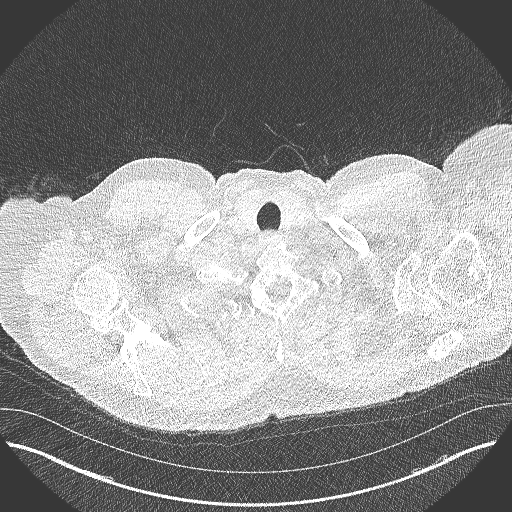

[Series 5: coronal · coronal · 0.64mm/px · 3 of 144 slices shown]
[im 29/144  lung]
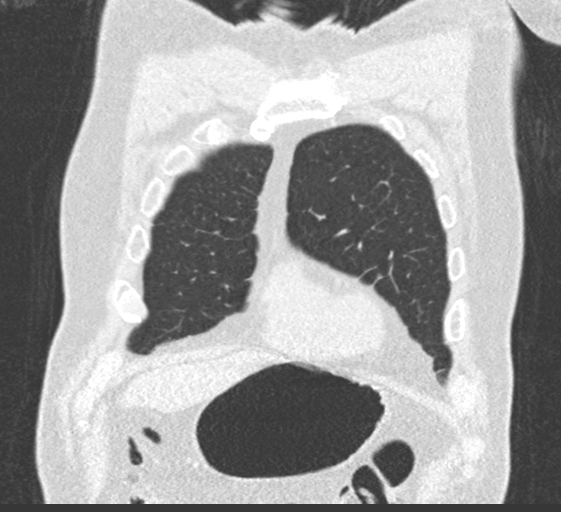
[im 58/144  lung]
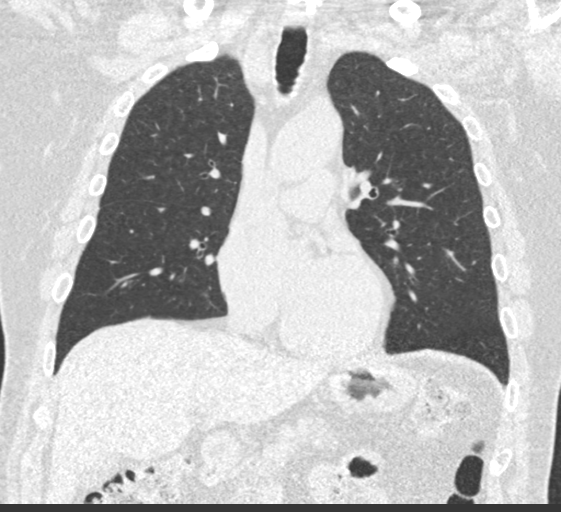
[im 86/144  lung]
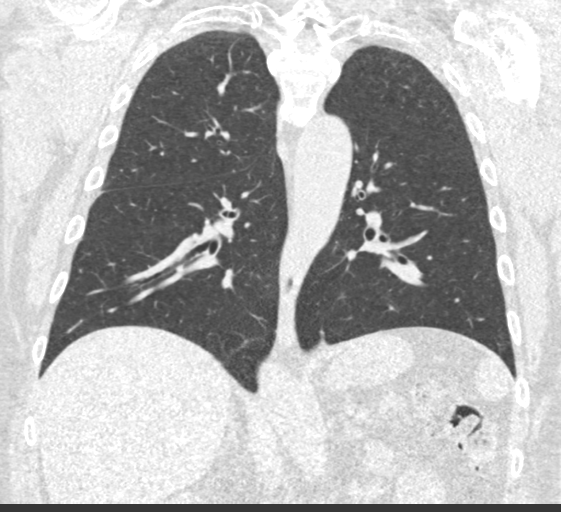

[15 of 36 positions shown; findings below may reference images not displayed]

FINDINGS: Cardiovascular: The heart size is normal. Aortic atherosclerosis. No
pericardial effusion. Coronary artery calcifications.

Mediastinum/Nodes: No enlarged mediastinal, hilar, or axillary lymph
nodes. Thyroid gland, trachea, and esophagus demonstrate no
significant findings.

Lungs/Pleura: No pleural effusion. Mild centrilobular and paraseptal
emphysema. No airspace consolidation. Several small calcified and
noncalcified lung nodules are identified measuring up to 4.3 mm. No
suspicious nodules identified at this time.

Upper Abdomen: No acute abnormality.

Musculoskeletal: Spondylosis identified within the thoracic spine.
No acute or suspicious osseous findings.
IMPRESSION: 1. Lung-RADS 2, benign appearance or behavior. Continue annual
screening with low-dose chest CT without contrast in 12 months.
2. Coronary artery calcifications.
3. Aortic Atherosclerosis (RKCVL-F98.8) and Emphysema (RKCVL-JQU.U).

## 2023-04-16 ENCOUNTER — Telehealth: Payer: Self-pay

## 2023-04-16 ENCOUNTER — Telehealth: Payer: Self-pay | Admitting: Internal Medicine

## 2023-04-16 ENCOUNTER — Other Ambulatory Visit: Payer: Self-pay

## 2023-04-16 ENCOUNTER — Other Ambulatory Visit (HOSPITAL_COMMUNITY): Payer: Self-pay

## 2023-04-16 DIAGNOSIS — E1159 Type 2 diabetes mellitus with other circulatory complications: Secondary | ICD-10-CM

## 2023-04-16 DIAGNOSIS — E1169 Type 2 diabetes mellitus with other specified complication: Secondary | ICD-10-CM

## 2023-04-16 MED ORDER — ROSUVASTATIN CALCIUM 40 MG PO TABS
40.0000 mg | ORAL_TABLET | Freq: Every day | ORAL | 0 refills | Status: DC
  Filled 2023-04-16 – 2023-06-11 (×2): qty 90, 90d supply, fill #0

## 2023-04-16 MED ORDER — SYNJARDY 5-500 MG PO TABS
1.0000 | ORAL_TABLET | Freq: Two times a day (BID) | ORAL | 2 refills | Status: DC
  Filled 2023-04-16 – 2023-06-11 (×2): qty 60, 30d supply, fill #0

## 2023-04-16 MED ORDER — XIGDUO XR 5-1000 MG PO TB24
1.0000 | ORAL_TABLET | Freq: Every day | ORAL | 0 refills | Status: DC
  Filled 2023-04-16: qty 90, 90d supply, fill #0

## 2023-04-16 MED ORDER — OLMESARTAN MEDOXOMIL 40 MG PO TABS
40.0000 mg | ORAL_TABLET | Freq: Every day | ORAL | 0 refills | Status: DC
  Filled 2023-04-16 – 2023-06-11 (×2): qty 90, 90d supply, fill #0

## 2023-04-16 NOTE — Telephone Encounter (Signed)
 Opened in error

## 2023-04-16 NOTE — Telephone Encounter (Signed)
 Attempted to reach patient for a [facility] outreach telephone visit follow up regarding medication adherence review for Synjardy, olmesartan, and rosuvastatin. During earlier discussion with the patient, reviewed medication indication, dosing, and goals of therapy. Patient reported having cost issues with synjardy and no additional barriers to adherence.   Patient was contacted again to inquire more about cost (estimated cost was $464.53 for a 90d supply. Patient reported a copay of $43 and remained concerned about cost.   Upon further review, estimated cost for current calendar year is $154.76 for a 30 day supply ($0 deductible, with coinsurance of 25%). Available options for coverage includes, patient assistance, or change back to Metformin monotherapy at a higher dosage - ie 750mg  BID.     Will route to embedded pharmacist for follow up to discuss medication access with Synjardy.   Verdene Rio, PharmD PGY1 Pharmacy Resident

## 2023-04-16 NOTE — Telephone Encounter (Signed)
 Opened in erro

## 2023-04-17 NOTE — Telephone Encounter (Signed)
 Spoke with patient and explained options for insurance cost of the $154.76 vs PAP vs changing to Metformin monotherapy for cheaper copay. Explained reason for synjardy being preferred due to kidney protection and blood sugar lowering. Patient did report only taking once daily as opposed to twice daily as prescribed. He elected to continuing on synjary for now. Will send updated prescription with once daily sig to show appropriate adherence. Informed patient to let us know if unable to afford copay. Recommend Metformin ER 750mg  BID.   Verdene Rio, PharmD PGY1 Pharmacy Resident  '

## 2023-04-18 ENCOUNTER — Ambulatory Visit: Payer: Medicare HMO

## 2023-04-18 VITALS — BP 140/90 | HR 96 | Ht 70.0 in | Wt 194.4 lb

## 2023-04-18 DIAGNOSIS — Z0000 Encounter for general adult medical examination without abnormal findings: Secondary | ICD-10-CM

## 2023-04-18 NOTE — Progress Notes (Deleted)
 Subjective:   Allen Whitehead is a 73 y.o. who presents for a Medicare Wellness preventive visit.  Visit Complete: In person   Persons Participating in Visit: Patient.  AWV Questionnaire: No: Patient Medicare AWV questionnaire was not completed prior to this visit.  Cardiac Risk Factors include: male gender;hypertension;diabetes mellitus;advanced age (>69men, >59 women);dyslipidemia;Other (see comment), Risk factor comments: LVH, CAD     Objective:    Today's Vitals   04/18/23 1544  Weight: 194 lb 6.4 oz (88.2 kg)  Height: 5\' 10"  (1.778 m)   Body mass index is 27.89 kg/m.     04/18/2023    3:50 PM 12/11/2021    3:42 PM 04/22/2021    8:35 AM 02/15/2020    8:13 AM 10/12/2019   10:08 AM 11/09/2013    2:24 PM  Advanced Directives  Does Patient Have a Medical Advance Directive? Yes No No No No No  Type of Estate agent of [place];Living will       Copy of Healthcare Power of Attorney in Chart? No - copy requested       Would patient like information on creating a medical advance directive?  No - Patient declined No - Patient declined No - Patient declined No - Patient declined Yes - Educational materials given    Current Medications (verified) Outpatient Encounter Medications as of 04/18/2023  Medication Sig   aspirin EC 81 MG tablet Take 1 tablet (81 mg total) by mouth daily. Swallow whole.   Empagliflozin-metFORMIN HCl (SYNJARDY) 5-500 MG TABS Take 1 tablet by mouth 2 (two) times daily.   nitroGLYCERIN (NITROSTAT) 0.4 MG SL tablet Place 1 tablet (0.4 mg total) under the tongue every 5 (five) minutes x 3 doses as needed for chest pain.   olmesartan (BENICAR) 40 MG tablet Take 1 tablet (40 mg total) by mouth daily.   rosuvastatin (CRESTOR) 40 MG tablet Take 1 tablet (40 mg total) by mouth daily.   No facility-administered encounter medications on file as of 04/18/2023.    Allergies (verified) Patient has no known allergies.   History: Past Medical  History:  Diagnosis Date   Anxiety    Arthritis    CAD (coronary artery disease), native coronary artery    04/23/21: 80% pLAD-DES, 50% Cx treated medically   Depression    Diabetes mellitus without complication (HCC)    Elevated plasma metanephrines    GERD (gastroesophageal reflux disease)    past hx    Hyperlipidemia with target LDL less than 70    Hypertension    Kidney stones    Past Surgical History:  Procedure Laterality Date   BIOPSY  02/15/2020   Procedure: BIOPSY;  Surgeon: Napoleon Form, MD;  Location: [facility];  Service: Endoscopy;;   COLONOSCOPY     age 25 for blood in stool- Normal    COLONOSCOPY WITH PROPOFOL N/A 02/15/2020   Procedure: COLONOSCOPY WITH PROPOFOL;  Surgeon: Napoleon Form, MD;  Location: [facility];  Service: Endoscopy;  Laterality: N/A;   CORONARY STENT INTERVENTION N/A 04/23/2021   Procedure: CORONARY STENT INTERVENTION;  Surgeon: Corky Crafts, MD;  Location[facility] INVASIVE CV LAB;  Service: Cardiovascular;  Laterality: N/A;   ENDOSCOPIC MUCOSAL RESECTION  02/15/2020   Procedure: ENDOSCOPIC MUCOSAL RESECTION;  Surgeon: Napoleon Form, MD;  Location: [facility];  Service: Endoscopy;;   kidney stone retrieval     lasar treatment on [place]     LEFT HEART CATH AND CORONARY ANGIOGRAPHY N/A 04/23/2021  Procedure: LEFT HEART CATH AND CORONARY ANGIOGRAPHY;  Surgeon: Corky Crafts, MD;  Location: [facility] INVASIVE CV LAB;  Service: Cardiovascular;  Laterality: N/A;   POLYPECTOMY  02/15/2020   Procedure: POLYPECTOMY;  Surgeon: Napoleon Form, MD;  Location: [facility];  Service: Endoscopy;;   SUBMUCOSAL LIFTING INJECTION  02/15/2020   Procedure: SUBMUCOSAL LIFTING INJECTION;  Surgeon: Napoleon Form, MD;  Location: [facility];  Service: Endoscopy;;   VASECTOMY     Family History  Problem Relation Age of Onset   Cancer Mother        unsure type    Hypertension Mother    Alzheimer's disease Mother    Cancer Father     Diabetes Father    Lung cancer Father        + smoker    Lung cancer Brother    Colon cancer Other    Colon polyps Neg Hx    Esophageal cancer Neg Hx    Stomach cancer Neg Hx    Rectal cancer Neg Hx    Social History   Socioeconomic History   Marital status: Legally Separated    Spouse name: Not on file   Number of children: Not on file   Years of education: Not on file   Highest education level: Not on file  Occupational History   Occupation: [employer] RETIRED  Tobacco Use   Smoking status: Former    Current packs/day: 0.00    Average packs/day: 1 pack/day for 30.0 years (30.0 ttl pk-yrs)    Types: Cigarettes    Start date: 04/25/1991    Quit date: 04/24/2021    Years since quitting: 1.9    Passive exposure: Past   Smokeless tobacco: Never  Vaping Use   Vaping status: Never Used  Substance and Sexual Activity   Alcohol use: Yes    Alcohol/week: 2.0 standard drinks of alcohol    Types: 2 Cans of beer per week    Comment: occasionally   Drug use: No   Sexual activity: Yes    Partners: Female  Other Topics Concern   Not on file  Social History Narrative   Lives with a roommate   Social Drivers of Health   Financial Resource Strain: Medium Risk (04/18/2023)   Overall Financial Resource Strain (CARDIA)    Difficulty of Paying Living Expenses: Somewhat hard  Food Insecurity: No Food Insecurity (04/18/2023)   Hunger Vital Sign    Worried About Running Out of Food in the Last Year: Never true    Ran Out of Food in the Last Year: Never true  Transportation Needs: No Transportation Needs (04/18/2023)   PRAPARE - Administrator, Civil Service (Medical): No    Lack of Transportation (Non-Medical): No  Physical Activity: Unknown (04/18/2023)   Exercise Vital Sign    Days of Exercise per Week: 5 days    Minutes of Exercise per Session: Not on file  Stress: No Stress Concern Present (04/18/2023)   [employer] of Occupational Health - Occupational Stress  Questionnaire    Feeling of Stress : Only a little  Social Connections: Socially Isolated (04/18/2023)   Social Connection and Isolation Panel [NHANES]    Frequency of Communication with Friends and Family: More than three times a week    Frequency of Social Gatherings with Friends and Family: Once a week    Attends Religious Services: Never    Database administrator or Organizations: Not on file    Attends Club or  Organization Meetings: Never    Marital Status: Separated    Tobacco Counseling Counseling given: Not Answered    Clinical Intake:  Pre-visit preparation completed: Yes  Pain : No/denies pain     BMI - recorded: 27.89 Nutritional Status: BMI 25 -29 Overweight Nutritional Risks: Nausea/ vomitting/ diarrhea  Lab Results  Component Value Date   HGBA1C 6.9 (H) 01/07/2023   HGBA1C 7.1 (H) 07/10/2022   HGBA1C 8.1 (H) 02/11/2022     How often do you need to have someone help you when you read instructions, pamphlets, or other written materials from your doctor or pharmacy?: 1 - Never  Interpreter Needed?: No  Information entered by :: Shakti Fleer, RMA   Activities of Daily Living     04/18/2023    3:36 PM  In your present state of health, do you have any difficulty performing the following activities:  Hearing? 0  Vision? 0  Difficulty concentrating or making decisions? 0  Walking or climbing stairs? 0  Dressing or bathing? 0  Doing errands, shopping? 0  Preparing Food and eating ? N  Using the Toilet? N  In the past six months, have you accidently leaked urine? N  Do you have problems with loss of bowel control? N  Managing your Medications? N  Managing your Finances? N  Housekeeping or managing your Housekeeping? N    Patient Care Team: Etta Grandchild, MD as PCP - General (Internal Medicine) Christell Constant, MD as PCP - Cardiology (Cardiology)  Indicate any recent Medical Services you may have received from other than [facility] providers in  the past year (date may be approximate).     Assessment:   This is a routine wellness examination for Isaiahs.  Hearing/Vision screen Hearing Screening - Comments:: Denies hearing difficulties   Vision Screening - Comments:: Denies vision issues.    Goals Addressed             This Visit's Progress    To maintain my health after having a heart attack back in April 2023.   On track      Depression Screen     04/18/2023    3:54 PM 01/07/2023    2:26 PM 12/11/2021    3:25 PM 10/11/2021    1:10 PM 01/03/2021    1:26 PM 11/12/2019    2:08 PM 10/12/2019   10:10 AM  PHQ 2/9 Scores  PHQ - 2 Score 2 0 2 2 0 0 0  PHQ- 9 Score 2  3 4        Fall Risk     04/18/2023    3:50 PM 01/07/2023    2:26 PM 12/11/2021    3:21 PM 01/03/2021    1:26 PM 11/12/2019    2:08 PM  Fall Risk   Falls in the past year? 0 0 0 0 0  Number falls in past yr: 0 0 0  0  Injury with Fall? 0 0 0  0  Risk for fall due to : No Fall Risks Impaired balance/gait No Fall Risks  No Fall Risks  Follow up Falls prevention discussed;Falls evaluation completed Falls evaluation completed Falls prevention discussed  Falls evaluation completed    MEDICARE RISK AT HOME:  Medicare Risk at Home Any stairs in or around the home?: Yes (coming in front door) If so, are there any without handrails?: No Home free of loose throw rugs in walkways, pet beds, electrical cords, etc?: Yes Adequate lighting in your home to reduce risk  of falls?: Yes Life alert?: No Use of a cane, walker or w/c?: Yes (cane sometimes) Grab bars in the bathroom?: No Shower chair or bench in shower?: No Elevated toilet seat or a handicapped toilet?: No  TIMED UP AND GO:  Was the test performed?  Yes  Length of time to ambulate 10 feet: 20 sec Gait slow and steady without use of assistive device  Cognitive Function: 6CIT completed        12/11/2021    3:45 PM 10/12/2019   10:08 AM  6CIT Screen  What Year? 0 points 0 points  What  month? 0 points 0 points  What time? 0 points 0 points  Count back from 20 0 points 0 points  Months in reverse 0 points 0 points  Repeat phrase 0 points 0 points  Total Score 0 points 0 points    Immunizations Immunization History  Administered Date(s) Administered   PFIZER(Purple Top)SARS-COV-2 Vaccination 03/07/2019, 03/30/2019    Screening Tests Health Maintenance  Topic Date Due   Pneumonia Vaccine 93+ Years old (1 of 2 - PCV) Never done   Zoster Vaccines- Shingrix (1 of 2) Never done   HEMOGLOBIN A1C  07/08/2023   OPHTHALMOLOGY EXAM  01/03/2024   Diabetic kidney evaluation - eGFR measurement  01/07/2024   Diabetic kidney evaluation - Urine ACR  01/07/2024   FOOT EXAM  01/07/2024   Lung Cancer Screening  02/05/2024   Medicare Annual Wellness (AWV)  04/17/2024   Colonoscopy  02/14/2030   Hepatitis C Screening  Completed   HPV VACCINES  Aged Out   DTaP/Tdap/Td  Discontinued   INFLUENZA VACCINE  Discontinued   COVID-19 Vaccine  Discontinued    Health Maintenance  Health Maintenance Due  Topic Date Due   Pneumonia Vaccine 22+ Years old (1 of 2 - PCV) Never done   Zoster Vaccines- Shingrix (1 of 2) Never done   Health Maintenance Items Addressed: See Nurse Notes  Additional Screening:  Vision Screening: Recommended annual ophthalmology exams for early detection of glaucoma and other disorders of the eye.  Dental Screening: Recommended annual dental exams for proper oral hygiene  Community Resource Referral / Chronic Care Management: CRR required this visit?  No   CCM required this visit?  No     Plan:     I have personally reviewed and noted the following in the patient's chart:   Medical and social history Use of alcohol, tobacco or illicit drugs  Current medications and supplements including opioid prescriptions. Patient is not currently taking opioid prescriptions. Functional ability and status Nutritional status Physical activity Advanced  directives List of other physicians Hospitalizations, surgeries, and ER visits in previous 12 months Vitals Screenings to include cognitive, depression, and falls Referrals and appointments  In addition, I have reviewed and discussed with patient certain preventive protocols, quality metrics, and best practice recommendations. A written personalized care plan for preventive services as well as general preventive health recommendations were provided to patient.     Kason Benak L Domenick Quebedeaux, CMA   04/18/2023   After Visit Summary: (In Person-Printed) AVS printed and given to the patient  Notes: Please refer to Routing Comments.

## 2023-04-18 NOTE — Patient Instructions (Signed)
 [name] , Thank you for taking time to come for your Medicare Wellness Visit. I appreciate your ongoing commitment to your health goals. Please review the following plan we discussed and let me know if I can assist you in the future.   Referrals/Orders/Follow-Ups/Clinician Recommendations: It was nice to meet you today.  Keep up the good work.    This is a list of the screening recommended for you and due dates:  Health Maintenance  Topic Date Due   Pneumonia Vaccine (1 of 2 - PCV) Never done   Zoster (Shingles) Vaccine (1 of 2) Never done   Medicare Annual Wellness Visit  12/12/2022   Hemoglobin A1C  07/08/2023   Eye exam for diabetics  01/03/2024   Yearly kidney function blood test for diabetes  01/07/2024   Yearly kidney health urinalysis for diabetes  01/07/2024   Complete foot exam   01/07/2024   Screening for Lung Cancer  02/05/2024   Colon Cancer Screening  02/14/2030   Hepatitis C Screening  Completed   HPV Vaccine  Aged Out   DTaP/Tdap/Td vaccine  Discontinued   Flu Shot  Discontinued   COVID-19 Vaccine  Discontinued    Advanced directives: (Copy Requested) Please bring a copy of your health care power of attorney and living will to the office to be added to your chart at your convenience. You can mail to [facility] [address] [place], [place] [address] or email to [email or web address]  Next Medicare Annual Wellness Visit scheduled for next year: Yes

## 2023-04-21 NOTE — Progress Notes (Addendum)
 Subjective:   Allen Whitehead is a 73 y.o. who presents for a Medicare Wellness preventive visit.  Visit Complete: In person   Persons Participating in Visit: Patient.  AWV Questionnaire: No: Patient Medicare AWV questionnaire was not completed prior to this visit.  Cardiac Risk Factors include: male gender;hypertension;diabetes mellitus;advanced age (>39men, >27 women);dyslipidemia;Other (see comment), Risk factor comments: LVH, CAD     Objective:    Today's Vitals   04/18/23 1544  BP: (!) 140/90  Pulse: 96  SpO2: 98%  Weight: 194 lb 6.4 oz (88.2 kg)  Height: 5\' 10"  (1.778 m)   Body mass index is 27.89 kg/m.     04/18/2023    3:50 PM 12/11/2021    3:42 PM 04/22/2021    8:35 AM 02/15/2020    8:13 AM 10/12/2019   10:08 AM 11/09/2013    2:24 PM  Advanced Directives  Does Patient Have a Medical Advance Directive? Yes No No No No No  Type of Estate agent of [place];Living will       Copy of Healthcare Power of Attorney in Chart? No - copy requested       Would patient like information on creating a medical advance directive?  No - Patient declined No - Patient declined No - Patient declined No - Patient declined Yes - Educational materials given    Current Medications (verified) Outpatient Encounter Medications as of 04/18/2023  Medication Sig   aspirin EC 81 MG tablet Take 1 tablet (81 mg total) by mouth daily. Swallow whole.   Empagliflozin-metFORMIN HCl (SYNJARDY) 5-500 MG TABS Take 1 tablet by mouth 2 (two) times daily.   nitroGLYCERIN (NITROSTAT) 0.4 MG SL tablet Place 1 tablet (0.4 mg total) under the tongue every 5 (five) minutes x 3 doses as needed for chest pain.   olmesartan (BENICAR) 40 MG tablet Take 1 tablet (40 mg total) by mouth daily.   rosuvastatin (CRESTOR) 40 MG tablet Take 1 tablet (40 mg total) by mouth daily.   No facility-administered encounter medications on file as of 04/18/2023.    Allergies (verified) Patient has no  known allergies.   History: Past Medical History:  Diagnosis Date   Anxiety    Arthritis    CAD (coronary artery disease), native coronary artery    04/23/21: 80% pLAD-DES, 50% Cx treated medically   Depression    Diabetes mellitus without complication (HCC)    Elevated plasma metanephrines    GERD (gastroesophageal reflux disease)    past hx    Hyperlipidemia with target LDL less than 70    Hypertension    Kidney stones    Past Surgical History:  Procedure Laterality Date   BIOPSY  02/15/2020   Procedure: BIOPSY;  Surgeon: Napoleon Form, MD;  Location: [facility];  Service: Endoscopy;;   COLONOSCOPY     age 67 for blood in stool- Normal    COLONOSCOPY WITH PROPOFOL N/A 02/15/2020   Procedure: COLONOSCOPY WITH PROPOFOL;  Surgeon: Napoleon Form, MD;  Location: [facility];  Service: Endoscopy;  Laterality: N/A;   CORONARY STENT INTERVENTION N/A 04/23/2021   Procedure: CORONARY STENT INTERVENTION;  Surgeon: Corky Crafts, MD;  Location: [facility] INVASIVE CV LAB;  Service: Cardiovascular;  Laterality: N/A;   ENDOSCOPIC MUCOSAL RESECTION  02/15/2020   Procedure: ENDOSCOPIC MUCOSAL RESECTION;  Surgeon: Napoleon Form, MD;  Location: [facility];  Service: Endoscopy;;   kidney stone retrieval     lasar treatment on [place]  LEFT HEART CATH AND CORONARY ANGIOGRAPHY N/A 04/23/2021   Procedure: LEFT HEART CATH AND CORONARY ANGIOGRAPHY;  Surgeon: Corky Crafts, MD;  Location: [facility] INVASIVE CV LAB;  Service: Cardiovascular;  Laterality: N/A;   POLYPECTOMY  02/15/2020   Procedure: POLYPECTOMY;  Surgeon: Napoleon Form, MD;  Location: [facility];  Service: Endoscopy;;   SUBMUCOSAL LIFTING INJECTION  02/15/2020   Procedure: SUBMUCOSAL LIFTING INJECTION;  Surgeon: Napoleon Form, MD;  Location: [facility];  Service: Endoscopy;;   VASECTOMY     Family History  Problem Relation Age of Onset   Cancer Mother        unsure type    Hypertension Mother     Alzheimer's disease Mother    Cancer Father    Diabetes Father    Lung cancer Father        + smoker    Lung cancer Brother    Colon cancer Other    Colon polyps Neg Hx    Esophageal cancer Neg Hx    Stomach cancer Neg Hx    Rectal cancer Neg Hx    Social History   Socioeconomic History   Marital status: Legally Separated    Spouse name: Not on file   Number of children: Not on file   Years of education: Not on file   Highest education level: Not on file  Occupational History   Occupation: [employer] RETIRED  Tobacco Use   Smoking status: Former    Current packs/day: 0.00    Average packs/day: 1 pack/day for 30.0 years (30.0 ttl pk-yrs)    Types: Cigarettes    Start date: 04/25/1991    Quit date: 04/24/2021    Years since quitting: 1.9    Passive exposure: Past   Smokeless tobacco: Never  Vaping Use   Vaping status: Never Used  Substance and Sexual Activity   Alcohol use: Yes    Alcohol/week: 2.0 standard drinks of alcohol    Types: 2 Cans of beer per week    Comment: occasionally   Drug use: No   Sexual activity: Yes    Partners: Female  Other Topics Concern   Not on file  Social History Narrative   Lives with a roommate   Social Drivers of Health   Financial Resource Strain: Medium Risk (04/18/2023)   Overall Financial Resource Strain (CARDIA)    Difficulty of Paying Living Expenses: Somewhat hard  Food Insecurity: No Food Insecurity (04/18/2023)   Hunger Vital Sign    Worried About Running Out of Food in the Last Year: Never true    Ran Out of Food in the Last Year: Never true  Transportation Needs: No Transportation Needs (04/18/2023)   PRAPARE - Administrator, Civil Service (Medical): No    Lack of Transportation (Non-Medical): No  Physical Activity: Unknown (04/18/2023)   Exercise Vital Sign    Days of Exercise per Week: 5 days    Minutes of Exercise per Session: Not on file  Stress: No Stress Concern Present (04/18/2023)   [employer] of  Occupational Health - Occupational Stress Questionnaire    Feeling of Stress : Only a little  Social Connections: Socially Isolated (04/18/2023)   Social Connection and Isolation Panel [NHANES]    Frequency of Communication with Friends and Family: More than three times a week    Frequency of Social Gatherings with Friends and Family: Once a week    Attends Religious Services: Never    Database administrator or  Organizations: Not on file    Attends Club or Organization Meetings: Never    Marital Status: Separated    Tobacco Counseling Counseling given: Not Answered    Clinical Intake:  Pre-visit preparation completed: Yes  Pain : No/denies pain     BMI - recorded: 27.89 Nutritional Status: BMI 25 -29 Overweight Nutritional Risks: Nausea/ vomitting/ diarrhea  Lab Results  Component Value Date   HGBA1C 6.9 (H) 01/07/2023   HGBA1C 7.1 (H) 07/10/2022   HGBA1C 8.1 (H) 02/11/2022     How often do you need to have someone help you when you read instructions, pamphlets, or other written materials from your doctor or pharmacy?: 1 - Never  Interpreter Needed?: No  Information entered by :: Ramonda Galyon, RMA   Activities of Daily Living     04/18/2023    3:36 PM  In your present state of health, do you have any difficulty performing the following activities:  Hearing? 0  Vision? 0  Difficulty concentrating or making decisions? 0  Walking or climbing stairs? 0  Dressing or bathing? 0  Doing errands, shopping? 0  Preparing Food and eating ? N  Using the Toilet? N  In the past six months, have you accidently leaked urine? N  Do you have problems with loss of bowel control? N  Managing your Medications? N  Managing your Finances? N  Housekeeping or managing your Housekeeping? N    Patient Care Team: Etta Grandchild, MD as PCP - General (Internal Medicine) Christell Constant, MD as PCP - Cardiology (Cardiology)  Indicate any recent Medical Services you may have  received from other than [facility] providers in the past year (date may be approximate).     Assessment:   This is a routine wellness examination for Allen Whitehead.  Hearing/Vision screen Hearing Screening - Comments:: Denies hearing difficulties   Vision Screening - Comments:: Denies vision issues.    Goals Addressed             This Visit's Progress    To maintain my health after having a heart attack back in April 2023.   On track      Depression Screen     04/18/2023    3:54 PM 01/07/2023    2:26 PM 12/11/2021    3:25 PM 10/11/2021    1:10 PM 01/03/2021    1:26 PM 11/12/2019    2:08 PM 10/12/2019   10:10 AM  PHQ 2/9 Scores  PHQ - 2 Score 2 0 2 2 0 0 0  PHQ- 9 Score 2  3 4        Fall Risk     04/18/2023    3:50 PM 01/07/2023    2:26 PM 12/11/2021    3:21 PM 01/03/2021    1:26 PM 11/12/2019    2:08 PM  Fall Risk   Falls in the past year? 0 0 0 0 0  Number falls in past yr: 0 0 0  0  Injury with Fall? 0 0 0  0  Risk for fall due to : No Fall Risks Impaired balance/gait No Fall Risks  No Fall Risks  Follow up Falls prevention discussed;Falls evaluation completed Falls evaluation completed Falls prevention discussed  Falls evaluation completed    MEDICARE RISK AT HOME:  Medicare Risk at Home Any stairs in or around the home?: Yes (coming in front door) If so, are there any without handrails?: No Home free of loose throw rugs in walkways, pet beds, electrical cords,  etc?: Yes Adequate lighting in your home to reduce risk of falls?: Yes Life alert?: No Use of a cane, walker or w/c?: Yes (cane sometimes) Grab bars in the bathroom?: No Shower chair or bench in shower?: No Elevated toilet seat or a handicapped toilet?: No  TIMED UP AND GO:  Was the test performed?  Yes  Length of time to ambulate 10 feet: 20 sec Gait slow and steady without use of assistive device  Cognitive Function: Normal: Normal cognitive status assessed by direct observation by this Clinical  Health Advisor. No abnormalities found. Patient is able to answer questions in an accurate and timely manner.        12/11/2021    3:45 PM 10/12/2019   10:08 AM  6CIT Screen  What Year? 0 points 0 points  What month? 0 points 0 points  What time? 0 points 0 points  Count back from 20 0 points 0 points  Months in reverse 0 points 0 points  Repeat phrase 0 points 0 points  Total Score 0 points 0 points    Immunizations Immunization History  Administered Date(s) Administered   PFIZER(Purple Top)SARS-COV-2 Vaccination 03/07/2019, 03/30/2019    Screening Tests Health Maintenance  Topic Date Due   Pneumonia Vaccine 65+ Years old (1 of 2 - PCV) Never done   Zoster Vaccines- Shingrix (1 of 2) Never done   HEMOGLOBIN A1C  07/08/2023   OPHTHALMOLOGY EXAM  01/03/2024   Diabetic kidney evaluation - eGFR measurement  01/07/2024   Diabetic kidney evaluation - Urine ACR  01/07/2024   FOOT EXAM  01/07/2024   Lung Cancer Screening  02/05/2024   Medicare Annual Wellness (AWV)  04/17/2024   Colonoscopy  02/14/2030   Hepatitis C Screening  Completed   HPV VACCINES  Aged Out   DTaP/Tdap/Td  Discontinued   INFLUENZA VACCINE  Discontinued   COVID-19 Vaccine  Discontinued    Health Maintenance  Health Maintenance Due  Topic Date Due   Pneumonia Vaccine 46+ Years old (1 of 2 - PCV) Never done   Zoster Vaccines- Shingrix (1 of 2) Never done   Health Maintenance Items Addressed: See Nurse Notes  Additional Screening:  Vision Screening: Recommended annual ophthalmology exams for early detection of glaucoma and other disorders of the eye.  Dental Screening: Recommended annual dental exams for proper oral hygiene  Community Resource Referral / Chronic Care Management: CRR required this visit?  No   CCM required this visit?  No     Plan:     I have personally reviewed and noted the following in the patient's chart:   Medical and social history Use of alcohol, tobacco or  illicit drugs  Current medications and supplements including opioid prescriptions. Patient is not currently taking opioid prescriptions. Functional ability and status Nutritional status Physical activity Advanced directives List of other physicians Hospitalizations, surgeries, and ER visits in previous 12 months Vitals Screenings to include cognitive, depression, and falls Referrals and appointments  In addition, I have reviewed and discussed with patient certain preventive protocols, quality metrics, and best practice recommendations. A written personalized care plan for preventive services as well as general preventive health recommendations were provided to patient.     Keven Osborn L Larra Crunkleton, CMA   04/18/2023  After Visit Summary: (In Person-Printed) AVS printed and given to the patient  Notes: Please refer to Routing Comments.

## 2023-04-28 ENCOUNTER — Other Ambulatory Visit (HOSPITAL_COMMUNITY): Payer: Self-pay

## 2023-06-11 ENCOUNTER — Telehealth: Payer: Self-pay

## 2023-06-11 ENCOUNTER — Other Ambulatory Visit (HOSPITAL_COMMUNITY): Payer: Self-pay

## 2023-06-11 NOTE — Telephone Encounter (Signed)
 This patient is appearing on a report for being at risk of failing the adherence measure for cholesterol (statin), diabetes, and hypertension (ACEi/ARB) medications last calendar year.   Medication: olmesartan  40 mg daily Last fill date: 09/02/22 for 90 day supply  Medication: Synjardy  5-500 mg twice daily Last fill date: 12/20/22 for 30 day supply  Medication: rosuvastatin  40 mg daily Last fill date: 10/31/22 for 90 day supply  Spoke with patient who would like his medications refilled. Scripts already sent on 04/16/23, so contacted pharmacy to facilitate refills. Discussed adherence with patient who stated he does occasionally forget to take his medications but takes on most days. Declines the use of pill box or alarms. Voices that he does not like to take a lot of medications.  Discussed cost of Synjardy  (~$154/30 days). Patient states he'll be able to afford filling the medication monthly. He mentioned concern about the cost of Synjardy  adding to his other expenses such as rent and utilities. Screened for Synjardy  PAP and patient voiced his income is probably borderline for eligibility (~[address]000 for household of 2). Offered to submit application to see if he gets approved but patient declined. Also offered going back to metformin  monotherapy, however, patient would like the empagliflozin  component as he is aware it was started by Dr. Rochelle Chu for kidney protection. Patient aware to contact the office if the cost of Synjardy  becomes too much. No further action needed.  Abelina Abide, PharmD PGY1 Pharmacy Resident 06/11/2023 3:40 PM

## 2023-06-12 ENCOUNTER — Other Ambulatory Visit (HOSPITAL_COMMUNITY): Payer: Self-pay

## 2023-07-08 ENCOUNTER — Encounter: Payer: Self-pay | Admitting: Internal Medicine

## 2023-07-08 ENCOUNTER — Ambulatory Visit

## 2023-07-08 ENCOUNTER — Ambulatory Visit (INDEPENDENT_AMBULATORY_CARE_PROVIDER_SITE_OTHER): Payer: Medicare HMO | Admitting: Internal Medicine

## 2023-07-08 VITALS — BP 162/106 | HR 82 | Temp 98.6°F | Resp 16 | Ht 70.0 in | Wt 191.8 lb

## 2023-07-08 DIAGNOSIS — I2510 Atherosclerotic heart disease of native coronary artery without angina pectoris: Secondary | ICD-10-CM | POA: Diagnosis not present

## 2023-07-08 DIAGNOSIS — E1159 Type 2 diabetes mellitus with other circulatory complications: Secondary | ICD-10-CM

## 2023-07-08 DIAGNOSIS — E119 Type 2 diabetes mellitus without complications: Secondary | ICD-10-CM | POA: Insufficient documentation

## 2023-07-08 DIAGNOSIS — I152 Hypertension secondary to endocrine disorders: Secondary | ICD-10-CM

## 2023-07-08 DIAGNOSIS — Z7984 Long term (current) use of oral hypoglycemic drugs: Secondary | ICD-10-CM | POA: Diagnosis not present

## 2023-07-08 DIAGNOSIS — E785 Hyperlipidemia, unspecified: Secondary | ICD-10-CM | POA: Diagnosis not present

## 2023-07-08 DIAGNOSIS — I10 Essential (primary) hypertension: Secondary | ICD-10-CM | POA: Diagnosis not present

## 2023-07-08 DIAGNOSIS — R5383 Other fatigue: Secondary | ICD-10-CM | POA: Insufficient documentation

## 2023-07-08 DIAGNOSIS — M545 Low back pain, unspecified: Secondary | ICD-10-CM | POA: Insufficient documentation

## 2023-07-08 DIAGNOSIS — E669 Obesity, unspecified: Secondary | ICD-10-CM | POA: Insufficient documentation

## 2023-07-08 DIAGNOSIS — Z79899 Other long term (current) drug therapy: Secondary | ICD-10-CM | POA: Insufficient documentation

## 2023-07-08 DIAGNOSIS — M1991 Primary osteoarthritis, unspecified site: Secondary | ICD-10-CM | POA: Insufficient documentation

## 2023-07-08 DIAGNOSIS — E1169 Type 2 diabetes mellitus with other specified complication: Secondary | ICD-10-CM | POA: Diagnosis not present

## 2023-07-08 DIAGNOSIS — M2550 Pain in unspecified joint: Secondary | ICD-10-CM | POA: Insufficient documentation

## 2023-07-08 DIAGNOSIS — R220 Localized swelling, mass and lump, head: Secondary | ICD-10-CM | POA: Diagnosis not present

## 2023-07-08 DIAGNOSIS — M069 Rheumatoid arthritis, unspecified: Secondary | ICD-10-CM | POA: Insufficient documentation

## 2023-07-08 LAB — HEPATIC FUNCTION PANEL
ALT: 17 U/L (ref 0–53)
AST: 20 U/L (ref 0–37)
Albumin: 4.4 g/dL (ref 3.5–5.2)
Alkaline Phosphatase: 69 U/L (ref 39–117)
Bilirubin, Direct: 0.1 mg/dL (ref 0.0–0.3)
Total Bilirubin: 0.4 mg/dL (ref 0.2–1.2)
Total Protein: 7.8 g/dL (ref 6.0–8.3)

## 2023-07-08 LAB — CBC WITH DIFFERENTIAL/PLATELET
Basophils Absolute: 0.1 10*3/uL (ref 0.0–0.1)
Basophils Relative: 0.7 % (ref 0.0–3.0)
Eosinophils Absolute: 0.2 10*3/uL (ref 0.0–0.7)
Eosinophils Relative: 2.8 % (ref 0.0–5.0)
HCT: 42.7 % (ref 39.0–52.0)
Hemoglobin: 14.2 g/dL (ref 13.0–17.0)
Lymphocytes Relative: 21.8 % (ref 12.0–46.0)
Lymphs Abs: 1.7 10*3/uL (ref 0.7–4.0)
MCHC: 33.3 g/dL (ref 30.0–36.0)
MCV: 88.5 fl (ref 78.0–100.0)
Monocytes Absolute: 0.7 10*3/uL (ref 0.1–1.0)
Monocytes Relative: 8.8 % (ref 3.0–12.0)
Neutro Abs: 5.2 10*3/uL (ref 1.4–7.7)
Neutrophils Relative %: 65.9 % (ref 43.0–77.0)
Platelets: 234 10*3/uL (ref 150.0–400.0)
RBC: 4.83 Mil/uL (ref 4.22–5.81)
RDW: 14 % (ref 11.5–15.5)
WBC: 7.9 10*3/uL (ref 4.0–10.5)

## 2023-07-08 LAB — C-REACTIVE PROTEIN: CRP: <1 mg/dL (ref 0.5–20.0)

## 2023-07-08 LAB — HEMOGLOBIN A1C: Hgb A1c MFr Bld: 6.8 % — ABNORMAL HIGH (ref 4.6–6.5)

## 2023-07-08 LAB — MICROALBUMIN / CREATININE URINE RATIO
Creatinine,U: 42.7 mg/dL
Microalb Creat Ratio: UNDETERMINED mg/g (ref 0.0–30.0)
Microalb, Ur: <0.7 mg/dL

## 2023-07-08 LAB — BASIC METABOLIC PANEL WITH GFR
BUN: 12 mg/dL (ref 6–23)
CO2: 28 meq/L (ref 19–32)
Calcium: 9.8 mg/dL (ref 8.4–10.5)
Chloride: 105 meq/L (ref 96–112)
Creatinine, Ser: 0.91 mg/dL (ref 0.40–1.50)
GFR: 84.91 mL/min (ref >60.00)
Glucose, Bld: 83 mg/dL (ref 70–99)
Potassium: 4.1 meq/L (ref 3.5–5.1)
Sodium: 139 meq/L (ref 135–145)

## 2023-07-08 NOTE — Patient Instructions (Signed)
 Hypertension, Adult High blood pressure (hypertension) is when the force of blood pumping through the arteries is too strong. The arteries are the blood vessels that carry blood from the heart throughout the body. Hypertension forces the heart to work harder to pump blood and may cause arteries to become narrow or stiff. Untreated or uncontrolled hypertension can lead to a heart attack, heart failure, a stroke, kidney disease, and other problems. A blood pressure reading consists of a higher number over a lower number. Ideally, your blood pressure should be below 120/80. The first ("top") number is called the systolic pressure. It is a measure of the pressure in your arteries as your heart beats. The second ("bottom") number is called the diastolic pressure. It is a measure of the pressure in your arteries as the heart relaxes. What are the causes? The exact cause of this condition is not known. There are some conditions that result in high blood pressure. What increases the risk? Certain factors may make you more likely to develop high blood pressure. Some of these risk factors are under your control, including: Smoking. Not getting enough exercise or physical activity. Being overweight. Having too much fat, sugar, calories, or salt (sodium) in your diet. Drinking too much alcohol. Other risk factors include: Having a personal history of heart disease, diabetes, high cholesterol, or kidney disease. Stress. Having a family history of high blood pressure and high cholesterol. Having obstructive sleep apnea. Age. The risk increases with age. What are the signs or symptoms? High blood pressure may not cause symptoms. Very high blood pressure (hypertensive crisis) may cause: Headache. Fast or irregular heartbeats (palpitations). Shortness of breath. Nosebleed. Nausea and vomiting. Vision changes. Severe chest pain, dizziness, and seizures. How is this diagnosed? This condition is diagnosed by  measuring your blood pressure while you are seated, with your arm resting on a flat surface, your legs uncrossed, and your feet flat on the floor. The cuff of the blood pressure monitor will be placed directly against the skin of your upper arm at the level of your heart. Blood pressure should be measured at least twice using the same arm. Certain conditions can cause a difference in blood pressure between your right and left arms. If you have a high blood pressure reading during one visit or you have normal blood pressure with other risk factors, you may be asked to: Return on a different day to have your blood pressure checked again. Monitor your blood pressure at home for 1 week or longer. If you are diagnosed with hypertension, you may have other blood or imaging tests to help your health care provider understand your overall risk for other conditions. How is this treated? This condition is treated by making healthy lifestyle changes, such as eating healthy foods, exercising more, and reducing your alcohol intake. You may be referred for counseling on a healthy diet and physical activity. Your health care provider may prescribe medicine if lifestyle changes are not enough to get your blood pressure under control and if: Your systolic blood pressure is above 130. Your diastolic blood pressure is above 80. Your personal target blood pressure may vary depending on your medical conditions, your age, and other factors. Follow these instructions at home: Eating and drinking  Eat a diet that is high in fiber and potassium, and low in sodium, added sugar, and fat. An example of this eating plan is called the DASH diet. DASH stands for Dietary Approaches to Stop Hypertension. To eat this way: Eat  plenty of fresh fruits and vegetables. Try to fill one half of your plate at each meal with fruits and vegetables. Eat whole grains, such as whole-wheat pasta, brown rice, or whole-grain bread. Fill about one  fourth of your plate with whole grains. Eat or drink low-fat dairy products, such as skim milk or low-fat yogurt. Avoid fatty cuts of meat, processed or cured meats, and poultry with skin. Fill about one fourth of your plate with lean proteins, such as fish, chicken without skin, beans, eggs, or tofu. Avoid pre-made and processed foods. These tend to be higher in sodium, added sugar, and fat. Reduce your daily sodium intake. Many people with hypertension should eat less than 1,500 mg of sodium a day. Do not drink alcohol if: Your health care provider tells you not to drink. You are pregnant, may be pregnant, or are planning to become pregnant. If you drink alcohol: Limit how much you have to: 0-1 drink a day for women. 0-2 drinks a day for men. Know how much alcohol is in your drink. In the [place] one drink equals one 12 oz bottle of beer (355 mL), one 5 oz glass of wine (148 mL), or one 1 oz glass of hard liquor (44 mL). Lifestyle  Work with your health care provider to maintain a healthy body weight or to lose weight. Ask what an ideal weight is for you. Get at least 30 minutes of exercise that causes your heart to beat faster (aerobic exercise) most days of the week. Activities may include walking, swimming, or biking. Include exercise to strengthen your muscles (resistance exercise), such as Pilates or lifting weights, as part of your weekly exercise routine. Try to do these types of exercises for 30 minutes at least 3 days a week. Do not use any products that contain nicotine or tobacco. These products include cigarettes, chewing tobacco, and vaping devices, such as e-cigarettes. If you need help quitting, ask your health care provider. Monitor your blood pressure at home as told by your health care provider. Keep all follow-up visits. This is important. Medicines Take over-the-counter and prescription medicines only as told by your health care provider. Follow directions carefully. Blood  pressure medicines must be taken as prescribed. Do not skip doses of blood pressure medicine. Doing this puts you at risk for problems and can make the medicine less effective. Ask your health care provider about side effects or reactions to medicines that you should watch for. Contact a health care provider if you: Think you are having a reaction to a medicine you are taking. Have headaches that keep coming back (recurring). Feel dizzy. Have swelling in your ankles. Have trouble with your vision. Get help right away if you: Develop a severe headache or confusion. Have unusual weakness or numbness. Feel faint. Have severe pain in your chest or abdomen. Vomit repeatedly. Have trouble breathing. These symptoms may be an emergency. Get help right away. Call 911. Do not wait to see if the symptoms will go away. Do not drive yourself to the hospital. Summary Hypertension is when the force of blood pumping through your arteries is too strong. If this condition is not controlled, it may put you at risk for serious complications. Your personal target blood pressure may vary depending on your medical conditions, your age, and other factors. For most people, a normal blood pressure is less than 120/80. Hypertension is treated with lifestyle changes, medicines, or a combination of both. Lifestyle changes include losing weight, eating a healthy,  low-sodium diet, exercising more, and limiting alcohol. This information is not intended to replace advice given to you by your health care provider. Make sure you discuss any questions you have with your health care provider. Document Revised: 11/14/2020 Document Reviewed: 11/14/2020 Elsevier Patient Education  2024 [employer].

## 2023-07-08 NOTE — Progress Notes (Signed)
 Subjective:  Patient ID: Allen Whitehead, male    DOB: February 21, 1950  Age: 73 y.o. MRN: [id number]  CC: Medical Management of Chronic Issues (6 month follow up. Swelling in the gland under his neck. ), Coronary Artery Disease, Hypertension, and Hyperlipidemia   HPI Allen Whitehead presents for f/up ----  Discussed the use of AI scribe software for clinical note transcription with the patient, who gave verbal consent to proceed.  History of Present Illness   Allen Whitehead is a 73 year old male with heart disease who presents with neck swelling and low back pain.  He has experienced intermittent swelling and pain in a gland in his neck for years, with increased severity over the last two weeks. The swelling has been reduced with the application of a topical treatment but the area remains hard.  He describes sudden onset low back pain that began while brushing his teeth on Sunday morning. The pain is localized to the hip area, just above the belt line, and does not radiate to the legs or feet. He has been taking Advil for pain relief, which provides temporary relief.   He reports a slight, unintentional weight loss of about two pounds without significant changes in appetite or attempts to lose weight. He remains somewhat active, working a little, but does not engage in extensive physical activity.  He smokes and has a history of heart disease, including a heart attack in 2023. He is unsure about the timing of his last EKG but recalls having one done previously. No chest pain, shortness of breath, or palpitations. Denies headache, blurred vision, excessive thirst, excessive urination, and tooth pain.       Outpatient Medications Prior to Visit  Medication Sig Dispense Refill   nitroGLYCERIN  (NITROSTAT ) 0.4 MG SL tablet Place 1 tablet (0.4 mg total) under the tongue every 5 (five) minutes x 3 doses as needed for chest pain. 25 tablet 3   aspirin  EC 81 MG tablet Take 1 tablet (81 mg total)  by mouth daily. Swallow whole. 90 tablet 1   Empagliflozin -metFORMIN  HCl (SYNJARDY ) 5-500 MG TABS Take 1 tablet by mouth 2 (two) times daily. 60 tablet 2   olmesartan  (BENICAR ) 40 MG tablet Take 1 tablet (40 mg total) by mouth daily. 90 tablet 0   rosuvastatin  (CRESTOR ) 40 MG tablet Take 1 tablet (40 mg total) by mouth daily. 90 tablet 0   No facility-administered medications prior to visit.    ROS Review of Systems  Objective:  BP (!) 162/106 (BP Location: Left Arm, Patient Position: Sitting, Cuff Size: Normal)   Pulse 82   Temp 98.6 F (37 C) (Oral)   Resp 16   Ht 5' 10 (1.778 m)   Wt 191 lb 12.8 oz (87 kg)   SpO2 98%   BMI 27.52 kg/m   BP Readings from Last 3 Encounters:  07/08/23 (!) 162/106  04/18/23 (!) 140/90  01/07/23 130/76    Wt Readings from Last 3 Encounters:  07/08/23 191 lb 12.8 oz (87 kg)  04/18/23 194 lb 6.4 oz (88.2 kg)  01/07/23 196 lb (88.9 kg)    Physical Exam Vitals reviewed.  Constitutional:      Appearance: Normal appearance.  HENT:     Nose: Nose normal.     Mouth/Throat:     Mouth: Mucous membranes are moist.   Eyes:     General: No scleral icterus.    Conjunctiva/sclera: Conjunctivae normal.   Neck:     Thyroid :  No thyroid  mass, thyromegaly or thyroid  tenderness.    Cardiovascular:     Rate and Rhythm: Normal rate and regular rhythm.     Heart sounds: No murmur heard.    No friction rub. No gallop.     Comments: EKG-- NSR, 88 bpm No LVH, Q waves, or ST/T wave changes  Unchanged  Pulmonary:     Effort: Pulmonary effort is normal.     Breath sounds: No stridor. No wheezing, rhonchi or rales.  Abdominal:     General: Abdomen is flat.     Palpations: There is no mass.     Tenderness: There is no abdominal tenderness. There is no guarding.     Hernia: No hernia is present.   Musculoskeletal:        General: Normal range of motion.     Cervical back: Neck supple. No erythema.     Right lower leg: No edema.     Left lower  leg: No edema.  Lymphadenopathy:     Cervical: No cervical adenopathy.     Right cervical: No superficial, deep or posterior cervical adenopathy.    Left cervical: No superficial, deep or posterior cervical adenopathy.   Skin:    General: Skin is warm and dry.   Neurological:     General: No focal deficit present.     Mental Status: He is alert.   Psychiatric:        Mood and Affect: Mood normal.        Behavior: Behavior normal.     Lab Results  Component Value Date   WBC 7.9 07/08/2023   HGB 14.2 07/08/2023   HCT 42.7 07/08/2023   PLT 234.0 07/08/2023   GLUCOSE 83 07/08/2023   CHOL 84 07/09/2022   TRIG 108.0 07/09/2022   HDL 30.70 (L) 07/09/2022   LDLCALC 31 07/09/2022   ALT 17 07/08/2023   AST 20 07/08/2023   NA 139 07/08/2023   K 4.1 07/08/2023   CL 105 07/08/2023   CREATININE 0.91 07/08/2023   BUN 12 07/08/2023   CO2 28 07/08/2023   TSH 1.57 07/09/2022   PSA 0.45 01/07/2023   HGBA1C 6.8 (H) 07/08/2023   MICROALBUR <0.7 07/08/2023    CT CHEST LUNG CA SCREEN LOW DOSE W/O CM Result Date: 02/14/2023 CLINICAL DATA:  73 year old male with 52 pack-year history of smoking. EXAM: CT CHEST WITHOUT CONTRAST LOW-DOSE FOR LUNG CANCER SCREENING TECHNIQUE: Multidetector CT imaging of the chest was performed following the standard protocol without IV contrast. RADIATION DOSE REDUCTION: This exam was performed according to the departmental dose-optimization program which includes automated exposure control, adjustment of the mA and/or kV according to patient size and/or use of iterative reconstruction technique. COMPARISON:  03/22/2021 FINDINGS: Cardiovascular: The heart size is normal. No substantial pericardial effusion. Coronary artery calcification is evident. Mild atherosclerotic calcification is noted in the wall of the thoracic aorta. Mediastinum/Nodes: No mediastinal lymphadenopathy. No evidence for gross hilar lymphadenopathy although assessment is limited by the lack of  intravenous contrast on the current study. The esophagus has normal imaging features. There is no axillary lymphadenopathy. Lungs/Pleura: Centrilobular and paraseptal emphysema evident. Scattered tiny bilateral pulmonary nodules are stable. No new suspicious pulmonary nodule or mass. No focal airspace consolidation. No pleural effusion. Upper Abdomen: Visualized portion of the upper abdomen shows no acute findings. Musculoskeletal: No worrisome lytic or sclerotic osseous abnormality. IMPRESSION: Lung-RADS 2, benign appearance or behavior. Continue annual screening with low-dose chest CT without contrast in 12 months. Aortic Atherosclerosis (  ICD10-I70.0) and Emphysema (ICD10-J43.9). Electronically Signed   By: Donnal Fusi M.D.   On: 02/14/2023 09:03    Assessment & Plan:  Hyperlipidemia associated with type 2 diabetes mellitus (HCC) -     Hepatic function panel; Future -     Rosuvastatin  Calcium ; Take 1 tablet (40 mg total) by mouth daily.  Dispense: 90 tablet; Refill: 0  Malignant hypertension- EKG is negative LVH. BP is not at goal. Will add lozol . -     Basic metabolic panel with GFR; Future -     Urinalysis, Routine w reflex microscopic; Future -     EKG 12-Lead -     AMB Referral [facility] Care Management -     Indapamide ; Take 1 tablet (1.25 mg total) by mouth daily.  Dispense: 90 tablet; Refill: 0  Hypertension associated with diabetes (HCC) -     CBC with Differential/Platelet; Future -     Basic metabolic panel with GFR; Future -     Urinalysis, Routine w reflex microscopic; Future -     Olmesartan  Medoxomil; Take 1 tablet (40 mg total) by mouth daily.  Dispense: 90 tablet; Refill: 0 -     Indapamide ; Take 1 tablet (1.25 mg total) by mouth daily.  Dispense: 90 tablet; Refill: 0  Type 2 diabetes mellitus with other circulatory complication, without long-term current use of insulin  (HCC) -     Basic metabolic panel with GFR; Future -     Microalbumin / creatinine urine ratio; Future -      Urinalysis, Routine w reflex microscopic; Future -     Hemoglobin A1c; Future -     Synjardy ; Take 1 tablet by mouth 2 (two) times daily.  Dispense: 60 tablet; Refill: 2  Mass of left submandibular region- Will evaluate for infection/malignant with a CT. -     C-reactive protein; Future -     CT SOFT TISSUE NECK W CONTRAST; Future  Acute right-sided low back pain without sciatica -     DG Lumbar Spine Complete; Future  Coronary artery disease involving native coronary artery of native heart without angina pectoris -     Aspirin ; Take 1 tablet (81 mg total) by mouth daily. Swallow whole.  Dispense: 120 tablet; Refill: 1     Follow-up: Return in about 3 months (around 10/08/2023).  Sandra Crouch, MD

## 2023-07-09 ENCOUNTER — Other Ambulatory Visit (HOSPITAL_COMMUNITY): Payer: Self-pay

## 2023-07-09 ENCOUNTER — Other Ambulatory Visit: Payer: Self-pay

## 2023-07-09 ENCOUNTER — Ambulatory Visit: Payer: Self-pay | Admitting: Internal Medicine

## 2023-07-09 LAB — URINALYSIS, ROUTINE W REFLEX MICROSCOPIC
Bilirubin Urine: NEGATIVE
Ketones, ur: NEGATIVE
Leukocytes,Ua: NEGATIVE
Nitrite: NEGATIVE
Specific Gravity, Urine: <=1.005 — AB (ref 1.000–1.030)
Total Protein, Urine: NEGATIVE
Urine Glucose: >=1000 — AB
Urobilinogen, UA: 0.2 (ref 0.0–1.0)
WBC, UA: NONE SEEN (ref 0–?)
pH: 6 (ref 5.0–8.0)

## 2023-07-09 MED ORDER — SYNJARDY 5-500 MG PO TABS
1.0000 | ORAL_TABLET | Freq: Two times a day (BID) | ORAL | 2 refills | Status: AC
  Filled 2023-07-09: qty 60, 30d supply, fill #0

## 2023-07-09 MED ORDER — ASPIRIN 81 MG PO TBEC
81.0000 mg | DELAYED_RELEASE_TABLET | Freq: Every day | ORAL | 1 refills | Status: AC
Start: 2023-07-09 — End: ?
  Filled 2023-07-09: qty 120, 120d supply, fill #0

## 2023-07-09 MED ORDER — INDAPAMIDE 1.25 MG PO TABS
1.2500 mg | ORAL_TABLET | Freq: Every day | ORAL | 0 refills | Status: AC
  Filled 2023-07-09: qty 90, 90d supply, fill #0

## 2023-07-09 MED ORDER — OLMESARTAN MEDOXOMIL 40 MG PO TABS
40.0000 mg | ORAL_TABLET | Freq: Every day | ORAL | 0 refills | Status: AC
  Filled 2023-07-09: qty 90, 90d supply, fill #0

## 2023-07-09 MED ORDER — ROSUVASTATIN CALCIUM 40 MG PO TABS
40.0000 mg | ORAL_TABLET | Freq: Every day | ORAL | 0 refills | Status: AC
  Filled 2023-07-09: qty 90, 90d supply, fill #0

## 2023-07-11 ENCOUNTER — Other Ambulatory Visit (HOSPITAL_COMMUNITY): Payer: Self-pay

## 2023-07-11 ENCOUNTER — Other Ambulatory Visit: Payer: Self-pay | Admitting: Internal Medicine

## 2023-07-11 DIAGNOSIS — L049 Acute lymphadenitis, unspecified: Secondary | ICD-10-CM | POA: Insufficient documentation

## 2023-07-11 MED ORDER — AMOXICILLIN-POT CLAVULANATE 875-125 MG PO TABS
1.0000 | ORAL_TABLET | Freq: Two times a day (BID) | ORAL | 0 refills | Status: AC
Start: 2023-07-11 — End: 2023-07-26
  Filled 2023-07-11: qty 20, 10d supply, fill #0

## 2023-07-15 ENCOUNTER — Telehealth: Payer: Self-pay | Admitting: Internal Medicine

## 2023-07-15 NOTE — Telephone Encounter (Signed)
 Copied from CRM [id number]. Topic: Clinical - Medication Question >> Jul 15, 2023 11:46 AM Mercedes MATSU wrote: Reason for CRM: Patient states that he wants to know what medication has been sent to the pharmacy and if it was sent already. Patient is requesting a call back at [phone].

## 2023-07-16 ENCOUNTER — Other Ambulatory Visit: Payer: Self-pay | Admitting: Internal Medicine

## 2023-07-16 ENCOUNTER — Ambulatory Visit
Admission: RE | Admit: 2023-07-16 | Discharge: 2023-07-16 | Disposition: A | Discharge status: Home / Self Care | Source: Ambulatory Visit | Attending: Internal Medicine | Admitting: Internal Medicine

## 2023-07-16 ENCOUNTER — Telehealth: Payer: Self-pay | Admitting: *Deleted

## 2023-07-16 ENCOUNTER — Other Ambulatory Visit (HOSPITAL_COMMUNITY): Payer: Self-pay

## 2023-07-16 DIAGNOSIS — K115 Sialolithiasis: Secondary | ICD-10-CM | POA: Diagnosis not present

## 2023-07-16 DIAGNOSIS — R220 Localized swelling, mass and lump, head: Secondary | ICD-10-CM

## 2023-07-16 DIAGNOSIS — M272 Inflammatory conditions of jaws: Secondary | ICD-10-CM | POA: Insufficient documentation

## 2023-07-16 MED ORDER — IOPAMIDOL (ISOVUE-300) INJECTION 61%
75.0000 mL | Freq: Once | INTRAVENOUS | Status: AC | PRN
  Administered 2023-07-16: 75 mL via INTRAVENOUS

## 2023-07-16 NOTE — Progress Notes (Signed)
 Care Guide Pharmacy Note  07/16/2023 Name: Allen Whitehead MRN: [id number] DOB: 07-31-1950  Referred By: Joshua Debby CROME, MD Reason for referral: Complex Care Management (Outreach to schedule referral with pharmacist ) and Call Attempt #1   Allen Whitehead is a 73 y.o. year old male who is a primary care patient of Joshua Debby CROME, MD.  Allen Whitehead was referred to the pharmacist for assistance related to: HTN  An unsuccessful telephone outreach was attempted today to contact the patient who was referred to the pharmacy team for assistance with medication management. Additional attempts will be made to contact the patient.  Allen Whitehead, CMA [facility] Guide Direct Dial: [phone]  Fax: [phone] Website: [email or web address]

## 2023-07-17 ENCOUNTER — Ambulatory Visit: Admitting: Emergency Medicine

## 2023-07-17 ENCOUNTER — Ambulatory Visit: Payer: Self-pay

## 2023-07-17 ENCOUNTER — Encounter: Payer: Self-pay | Admitting: Emergency Medicine

## 2023-07-17 VITALS — BP 124/76 | HR 84 | Temp 98.1°F | Ht 70.0 in | Wt 190.0 lb

## 2023-07-17 DIAGNOSIS — K115 Sialolithiasis: Secondary | ICD-10-CM | POA: Diagnosis not present

## 2023-07-17 NOTE — Progress Notes (Signed)
 Allen Whitehead 73 y.o.   Chief Complaint  Patient presents with   Sore Throat    Patient states for years his glands would swell but then go away but this time it did not. He had a CT scan done yesterday and it says that he has stones in his salivary gland duct. [name] gave him abx that he started yesterday he says he feels a lot better today still a little pain.    HISTORY OF PRESENT ILLNESS: Acute problem visit today.  Patient of Dr. Debby Whitehead This is a 73 y.o. male here for follow-up of left submandibular mass.  Had CT scan of the area yesterday with the reading as follows:  CT Soft Tissue Neck W Contrast Result Date: 07/16/2023 CLINICAL DATA:  Left submandibular mass which has been present but seems to be enlarging. EXAM: CT NECK WITH CONTRAST TECHNIQUE: Multidetector CT imaging of the neck was performed using the standard protocol following the bolus administration of intravenous contrast. RADIATION DOSE REDUCTION: This exam was performed according to the departmental dose-optimization program which includes automated exposure control, adjustment of the mA and/or kV according to patient size and/or use of iterative reconstruction technique. CONTRAST:  75mL ISOVUE-300 IOPAMIDOL (ISOVUE-300) INJECTION 61% COMPARISON:  None Available. FINDINGS: Pharynx and larynx: No mucosal or submucosal lesion. Salivary glands: Parotid glands are normal. The right submandibular gland contains a 4 mm stone that could be near the origin of the submandibular duct, but there is no evidence of obstruction or inflammation presently. The left submandibular gland contains numerous stones, the largest measuring up to 2.2 cm in size. The gland shows mild hyperemia/enhancement and there is mild intraglandular ductal dilatation. There are several small stones in the distal submandibular duct on the left with a diameter of 4 mm extending over a length of 1.4 cm. Thyroid : Normal Lymph nodes: No lymphadenopathy on either  side of the neck. Normal cervical chain nodes. Vascular: Ordinary atherosclerotic calcification at the carotid bifurcations without visible stenosis. Limited intracranial: Normal Visualized orbits: Not included. Mastoids and visualized paranasal sinuses: Clear Skeleton: Erosive change of the LV older ridge of the left maxilla which could be chronic or indicate active dental disease/osteomyelitis. Upper chest: Lung apices are clear. Aortic atherosclerosis is present. Other: None IMPRESSION: 1. The left submandibular gland contains numerous stones, the largest measuring up to 2.2 cm in size. The gland shows mild hyperemia/enhancement and there is mild intraglandular ductal dilatation. There are several small stones in the distal submandibular duct on the left with a diameter of 4 mm extending over a length of 1.4 cm. 2. The right submandibular gland contains a 4 mm stone that could be near the origin of the submandibular duct, but there is no evidence of obstruction or inflammation presently. 3. Erosive change of the alveolar ridge of the left maxilla which could be chronic or indicate active dental disease/osteomyelitis. 4. Aortic atherosclerosis. Aortic Atherosclerosis (ICD10-I70.0). Electronically Signed   By: Oneil Officer M.D.   On: 07/16/2023 13:54     Sore Throat  Pertinent negatives include no abdominal pain, congestion, coughing, diarrhea, headaches, shortness of breath or vomiting.    Prior to Admission medications   Medication Sig Start Date End Date Taking? Authorizing Provider  amoxicillin -clavulanate (AUGMENTIN ) 875-125 MG tablet Take 1 tablet by mouth 2 (two) times daily for 10 days. 07/11/23 07/26/23 Yes Whitehead Allen CROME, MD  aspirin  EC 81 MG tablet Take 1 tablet (81 mg total) by mouth daily. Swallow whole. 07/09/23  Yes  Whitehead Allen CROME, MD  Empagliflozin -metFORMIN  HCl (SYNJARDY ) 5-500 MG TABS Take 1 tablet by mouth 2 (two) times daily. 07/09/23  Yes Whitehead Allen CROME, MD  indapamide  (LOZOL ) 1.25  MG tablet Take 1 tablet (1.25 mg total) by mouth daily. 07/09/23  Yes Whitehead Allen CROME, MD  nitroGLYCERIN  (NITROSTAT ) 0.4 MG SL tablet Place 1 tablet (0.4 mg total) under the tongue every 5 (five) minutes x 3 doses as needed for chest pain. 04/24/21  Yes Duke, Jon Garre, PA  olmesartan  (BENICAR ) 40 MG tablet Take 1 tablet (40 mg total) by mouth daily. 07/09/23  Yes Whitehead Allen CROME, MD  rosuvastatin  (CRESTOR ) 40 MG tablet Take 1 tablet (40 mg total) by mouth daily. 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   Stone of salivary gland or duct 07/16/2023   Lymphadenitis, acute 07/11/2023   Long term current use of therapeutic drug 07/08/2023   Obesity 07/08/2023   Primary osteoarthritis 07/08/2023   Rheumatoid arthritis (HCC) 07/08/2023   Diabetes mellitus (HCC) 07/08/2023   Mass of left submandibular region 07/08/2023   Acute right-sided low back pain without sciatica 07/08/2023   Aortic root dilation (HCC) 10/23/2022   Tobacco abuse 02/11/2022   Hypertension associated with diabetes (HCC) 02/11/2022   Coronary artery disease involving native coronary artery of native heart without angina pectoris 09/04/2021   Aortic atherosclerosis (HCC) 09/04/2021   Encounter for general adult medical examination with abnormal findings 01/19/2021   Malignant hypertension 01/11/2021   Hyperlipidemia associated with type 2 diabetes mellitus (HCC) 01/03/2021   Continuous tobacco abuse 01/03/2021   Benign prostatic hyperplasia without lower urinary tract symptoms 01/03/2021   LVH (left ventricular hypertrophy) due to hypertensive disease, without heart failure 01/03/2021   History of colonic polyps     Past Medical History:  Diagnosis Date   Anxiety    Arthritis    CAD (coronary artery disease), native coronary artery    04/23/21: 80% pLAD-DES, 50% Cx treated medically   Depression    Diabetes  mellitus without complication (HCC)    Elevated plasma metanephrines    GERD (gastroesophageal reflux disease)    past hx    Hyperlipidemia with target LDL less than 70    Hypertension    Kidney stones     Past Surgical History:  Procedure Laterality Date   BIOPSY  02/15/2020   Procedure: BIOPSY;  Surgeon: Shila Gustav GAILS, MD;  Location: [facility];  Service: Endoscopy;;   COLONOSCOPY     age 72 for blood in stool- Normal    COLONOSCOPY WITH PROPOFOL  N/A 02/15/2020   Procedure: COLONOSCOPY WITH PROPOFOL ;  Surgeon: Shila Gustav GAILS, MD;  Location: [facility];  Service: Endoscopy;  Laterality: N/A;   CORONARY STENT INTERVENTION N/A 04/23/2021   Procedure: CORONARY STENT INTERVENTION;  Surgeon: Dann Candyce RAMAN, MD;  Location: [facility] INVASIVE CV LAB;  Service: Cardiovascular;  Laterality: N/A;   ENDOSCOPIC MUCOSAL RESECTION  02/15/2020   Procedure: ENDOSCOPIC MUCOSAL RESECTION;  Surgeon: Shila Gustav GAILS, MD;  Location: [facility];  Service: Endoscopy;;   kidney stone retrieval     lasar treatment on [place]     LEFT HEART CATH AND CORONARY ANGIOGRAPHY N/A 04/23/2021   Procedure: LEFT HEART CATH AND CORONARY ANGIOGRAPHY;  Surgeon: Dann Candyce RAMAN, MD;  Location: [facility] INVASIVE CV LAB;  Service: Cardiovascular;  Laterality: N/A;   POLYPECTOMY  02/15/2020  Procedure: POLYPECTOMY;  Surgeon: Shila Gustav GAILS, MD;  Location: [facility];  Service: Endoscopy;;   SUBMUCOSAL LIFTING INJECTION  02/15/2020   Procedure: SUBMUCOSAL LIFTING INJECTION;  Surgeon: Shila Gustav GAILS, MD;  Location: [facility];  Service: Endoscopy;;   VASECTOMY      Social History   Socioeconomic History   Marital status: Legally Separated    Spouse name: Not on file   Number of children: Not on file   Years of education: Not on file   Highest education level: Not on file  Occupational History   Occupation: [employer] RET[employer]  Tobacco Use   Smoking status: Former    Current packs/day: 0.00     Average packs/day: 1 pack/day for 30.0 years (30.0 ttl pk-yrs)    Types: Cigarettes    Start date: 04/25/1991    Quit date: 04/24/2021    Years since quitting: 2.2    Passive exposure: Past   Smokeless tobacco: Never  Vaping Use   Vaping status: Never Used  Substance and Sexual Activity   Alcohol use: Yes    Alcohol/week: 2.0 standard drinks of alcohol    Types: 2 Cans of beer per week    Comment: occasionally   Drug use: No   Sexual activity: Yes    Partners: Female  Other Topics Concern   Not on file  Social History Narrative   Lives with a roommate   Social Drivers of Health   Financial Resource Strain: Medium Risk (04/18/2023)   Overall Financial Resource Strain (CARDIA)    Difficulty of Paying Living Expenses: Somewhat hard  Food Insecurity: No Food Insecurity (04/18/2023)   Hunger Vital Sign    Worried About Running Out of Food in the Last Year: Never true    Ran Out of Food in the Last Year: Never true  Transportation Needs: No Transportation Needs (04/18/2023)   PRAPARE - Administrator, Civil Service (Medical): No    Lack of Transportation (Non-Medical): No  Physical Activity: Unknown (04/18/2023)   Exercise Vital Sign    Days of Exercise per Week: 5 days    Minutes of Exercise per Session: Not on file  Stress: No Stress Concern Present (04/18/2023)   [employer] of Occupational Health - Occupational Stress Questionnaire    Feeling of Stress : Only a little  Social Connections: Socially Isolated (04/18/2023)   Social Connection and Isolation Panel    Frequency of Communication with Friends and Family: More than three times a week    Frequency of Social Gatherings with Friends and Family: Once a week    Attends Religious Services: Never    Database administrator or Organizations: Not on file    Attends Banker Meetings: Never    Marital Status: Separated  Intimate Partner Violence: Patient Unable To Answer (04/18/2023)   Humiliation,  Afraid, Rape, and Kick questionnaire    Fear of Current or Ex-Partner: Patient unable to answer    Emotionally Abused: Patient unable to answer    Physically Abused: Patient unable to answer    Sexually Abused: Patient unable to answer    Family History  Problem Relation Age of Onset   Cancer Mother        unsure type    Hypertension Mother    Alzheimer's disease Mother    Cancer Father    Diabetes Father    Lung cancer Father        + smoker    Lung cancer Brother  Colon cancer Other    Colon polyps Neg Hx    Esophageal cancer Neg Hx    Stomach cancer Neg Hx    Rectal cancer Neg Hx      Review of Systems  Constitutional: Negative.  Negative for chills and fever.  HENT: Negative.  Negative for congestion and sore throat.   Respiratory: Negative.  Negative for cough and shortness of breath.   Cardiovascular: Negative.  Negative for chest pain and palpitations.  Gastrointestinal:  Negative for abdominal pain, diarrhea, nausea and vomiting.  Neurological: Negative.  Negative for dizziness and headaches.  All other systems reviewed and are negative.  Vitals:   07/17/23 1330  BP: 124/76  Pulse: 84  Temp: 98.1 F (36.7 C)  SpO2: 95%    Physical Exam Constitutional:      Appearance: Normal appearance.  HENT:     Head: Normocephalic.   Eyes:     Extraocular Movements: Extraocular movements intact.    Cardiovascular:     Rate and Rhythm: Normal rate.  Pulmonary:     Effort: Pulmonary effort is normal.  Lymphadenopathy:     Head:     Left side of head: Submandibular adenopathy present.   Skin:    General: Skin is warm and dry.   Neurological:     Mental Status: He is alert and oriented to person, place, and time.   Psychiatric:        Behavior: Behavior normal.    ASSESSMENT & PLAN: A total of 32 minutes was spent with the patient and counseling/coordination of care regarding preparing for this visit, review of most recent office visit notes, review of  multiple chronic medical conditions under management, review of all medications, review of CT scan report done yesterday, need for ENT evaluation, need to continue antibiotics, pain management with Aleve, prognosis, documentation, and need for follow-up.  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  Multiple large stones. Possible infection.  Was started on antibiotics last night Was also referred to ENT Continues to smoke Continue Augmentin  875 mg twice a day and Aleve for inflammation and pain as needed CT scan report reviewed with patient      Patient Instructions  Salivary Stone  A salivary stone is a small cluster of mineral (mineral deposit) that builds up in the tubes (ducts) that drain the salivary glands. Most salivary stones are made of calcium . When a stone forms, saliva can back up into the gland and cause painful swelling. Your salivary glands are the glands that make saliva. You have six major salivary glands. Each gland has a duct that carries saliva into your mouth. Saliva keeps your mouth moist and breaks down the food that you eat. It also helps prevent tooth decay. Two salivary glands are found just in front of your ears (parotid). The ducts for these glands open up inside your cheeks, near your back teeth. You also have two glands under your tongue (sublingual) and two glands under your jaw (submandibular). The ducts for these glands open under your tongue. A stone can form in any salivary gland. The most common place for a salivary stone to form is in a submandibular salivary gland. What are the causes? Salivary stones may be caused by any condition that lessens the flow of saliva. It is not known why some people get stones. What increases the risk? You are more  likely to develop this condition if: You do not drink enough water. You smoke. You have any of these: High blood  pressure. Gout. Diabetes. What are the signs or symptoms? The main sign of a salivary stone is sudden swelling of a salivary gland during eating. This usually happens under the jaw on one side. Other signs and symptoms may include: Swelling of the cheek or under the tongue during eating. Pain in the swollen area. Trouble chewing or swallowing. Swelling that goes down after eating. Sometimes, the salivary stone may be seen. The stone is oval in shape and may be white or yellow in color. How is this diagnosed? This condition may be diagnosed based on: Your signs and symptoms. A physical exam. In many cases, your health care provider will be able to feel the stone in a duct inside your mouth. Imaging studies, such as: X-rays. Ultrasound. CT scan. MRI. You may need to see an ear, nose, and throat specialist (ENT or otolaryngologist) for diagnosis and treatment. How is this treated? Treatment for this condition depends on the size of the stone. A small stone that is not causing symptoms may be treated with home care. A stone that is large enough to cause symptoms may be treated by: Probing and widening of the duct to let the stone pass. Putting a thin, flexible scope (endoscope) into the duct to find and remove the stone. Breaking up the stone with sound waves. Removing the whole salivary gland. Follow these instructions at home: To relieve discomfort Take NSAIDs, such as ibuprofen, to help relieve pain and swelling as told by your health care provider. Follow these instructions every few hours: Suck on a lemon candy or a vitamin C lozenge to prompt the flow of saliva. Put a warm, damp cloth (warmcompress) over the gland. Gently massage the gland. General instructions  Take over-the-counter and prescription medicines only as told by your health care provider. Drink enough fluid to keep your urine pale yellow. Do not use any products that contain nicotine  or tobacco. These products  include cigarettes, chewing tobacco, and vaping devices, such as e-cigarettes. If you need help quitting, ask your health care provider. Keep all follow-up visits. This is important. Contact a health care provider if: You have pain and swelling in your face, jaw, or mouth after eating. You keep having swelling in any of these places: In front of your ear. Under your jaw. Inside your mouth. Get help right away if: You have pain and swelling in your face, jaw, or mouth, that suddenly gets worse. Your pain and swelling make it hard to swallow, talk, or breathe. These symptoms may be an emergency. Get help right away. Call 911. Do not wait to see if the symptoms will go away. Do not drive yourself to the hospital. Summary A salivary stone is a small clump of mineral (mineral deposit) that builds up in the ducts that drain your salivary glands. When a stone forms, saliva can back up into the gland and cause painful swelling. Salivary stones may be caused by any condition that lessens the flow of saliva. Treatment for this condition depends on the size of the stone. This information is not intended to replace advice given to you by your health care provider. Make sure you discuss any questions you have with your health care provider. Document Revised: 01/03/2021 Document Reviewed: 01/03/2021 Elsevier Patient Education  2024 Elsevier Inc.    Emil Schaumann, MD [facility]

## 2023-07-17 NOTE — Telephone Encounter (Signed)
 FYI Only or Action Required?: Action required by provider: request for appointment.  Patient was last seen in primary care on 07/08/2023 by Joshua Debby CROME, MD. Called Nurse Triage reporting Sore Throat. Symptoms began yesterday. Interventions attempted: Prescription medications: Augmentin  . Symptoms are: gradually worsening.  Triage Disposition: See Physician Within 24 Hours  Patient/caregiver understands and will follow disposition?: YesCopied from CRM #[id number]. Topic: Clinical - Red Word Triage >> Jul 17, 2023  9:23 AM Allen Whitehead wrote: [facility] that prompted transfer to Nurse Triage: Says his throat/lymph node is in pain, can't hardly eat - worsening.. swollen.. ( had a scan recently ) Reason for Disposition  SEVERE (e.Whitehead., excruciating) throat pain  Answer Assessment - Initial Assessment Questions 1. ONSET: When did the throat start hurting? (Hours or days ago)      20-30 years 2. SEVERITY: How bad is the sore throat? (Scale 1-10; mild, moderate or severe)   - MILD (1-3):  Doesn't interfere with eating or normal activities.   - MODERATE (4-7): Interferes with eating some solids and normal activities.   - SEVERE (8-10):  Excruciating pain, interferes with most normal activities.   - SEVERE WITH DYSPHAGIA (10): Can't swallow liquids, drooling.     severe 3. STREP EXPOSURE: Has there been any exposure to strep within the past week? If Yes, ask: What type of contact occurred?      Na    7. OTHER SYMPTOMS: Do you have any other symptoms? (e.Whitehead., difficulty breathing, headache, rash)     Hard to swallow   Pt stated the pain is worse after having scan yesterday. Pt stated he can't swallow Augmentin . Pt stated he started it yesterday.  RN read PCP notes of results. Pt has tried ice packs and heating pad. Pt has no relief. Pt can't hardly eat.  Protocols used: Sore Throat-A-AH

## 2023-07-17 NOTE — Assessment & Plan Note (Signed)
 Chronic problem.  Multiple large stones. Possible infection.  Was started on antibiotics last night Was also referred to ENT Continues to smoke Continue Augmentin  875 mg twice a day and Aleve for inflammation and pain as needed CT scan report reviewed with patient

## 2023-07-17 NOTE — Progress Notes (Signed)
 Care Guide Pharmacy Note  07/17/2023 Name: TEEJAY MEADER MRN: [id number] DOB: [birth year]  Referred By: Joshua Debby CROME, MD Reason for referral: Complex Care Management (Outreach to schedule referral with pharmacist ) and Call Attempt #1   WISDOM RICKEY is a 73 y.o. year old male who is a primary care patient of Joshua Debby CROME, MD.  Elsie DELENA Moose was referred to the pharmacist for assistance related to: HTN  An unsuccessful telephone outreach was attempted today to contact the patient who was referred to the pharmacy team for assistance with medication management. Additional attempts will be made to contact the patient.  Thedford Franks, CMA [place]  [facility], [facility] Guide Direct Dial: [phone]  Fax: [phone] Website: [email or web address]

## 2023-07-17 NOTE — Patient Instructions (Signed)
Salivary Stone  A salivary stone is a small cluster of mineral (mineral deposit) that builds up in the tubes (ducts) that drain the salivary glands. Most salivary stones are made of calcium. When a stone forms, saliva can back up into the gland and cause painful swelling. Your salivary glands are the glands that make saliva. You have six major salivary glands. Each gland has a duct that carries saliva into your mouth. Saliva keeps your mouth moist and breaks down the food that you eat. It also helps prevent tooth decay. Two salivary glands are found just in front of your ears (parotid). The ducts for these glands open up inside your cheeks, near your back teeth. You also have two glands under your tongue (sublingual) and two glands under your jaw (submandibular). The ducts for these glands open under your tongue. A stone can form in any salivary gland. The most common place for a salivary stone to form is in a submandibular salivary gland. What are the causes? Salivary stones may be caused by any condition that lessens the flow of saliva. It is not known why some people get stones. What increases the risk? You are more likely to develop this condition if: You do not drink enough water. You smoke. You have any of these: High blood pressure. Gout. Diabetes. What are the signs or symptoms? The main sign of a salivary stone is sudden swelling of a salivary gland during eating. This usually happens under the jaw on one side. Other signs and symptoms may include: Swelling of the cheek or under the tongue during eating. Pain in the swollen area. Trouble chewing or swallowing. Swelling that goes down after eating. Sometimes, the salivary stone may be seen. The stone is oval in shape and may be white or yellow in color. How is this diagnosed? This condition may be diagnosed based on: Your signs and symptoms. A physical exam. In many cases, your health care provider will be able to feel the stone in a  duct inside your mouth. Imaging studies, such as: X-rays. Ultrasound. CT scan. MRI. You may need to see an ear, nose, and throat specialist (ENT or otolaryngologist) for diagnosis and treatment. How is this treated? Treatment for this condition depends on the size of the stone. A small stone that is not causing symptoms may be treated with home care. A stone that is large enough to cause symptoms may be treated by: Probing and widening of the duct to let the stone pass. Putting a thin, flexible scope (endoscope) into the duct to find and remove the stone. Breaking up the stone with sound waves. Removing the whole salivary gland. Follow these instructions at home: To relieve discomfort Take NSAIDs, such as ibuprofen, to help relieve pain and swelling as told by your health care provider. Follow these instructions every few hours: Suck on a lemon candy or a vitamin C lozenge to prompt the flow of saliva. Put a warm, damp cloth (warmcompress) over the gland. Gently massage the gland. General instructions  Take over-the-counter and prescription medicines only as told by your health care provider. Drink enough fluid to keep your urine pale yellow. Do not use any products that contain nicotine or tobacco. These products include cigarettes, chewing tobacco, and vaping devices, such as e-cigarettes. If you need help quitting, ask your health care provider. Keep all follow-up visits. This is important. Contact a health care provider if: You have pain and swelling in your face, jaw, or mouth after eating. You  keep having swelling in any of these places: In front of your ear. Under your jaw. Inside your mouth. Get help right away if: You have pain and swelling in your face, jaw, or mouth, that suddenly gets worse. Your pain and swelling make it hard to swallow, talk, or breathe. These symptoms may be an emergency. Get help right away. Call 911. Do not wait to see if the symptoms will go  away. Do not drive yourself to the hospital. Summary A salivary stone is a small clump of mineral (mineral deposit) that builds up in the ducts that drain your salivary glands. When a stone forms, saliva can back up into the gland and cause painful swelling. Salivary stones may be caused by any condition that lessens the flow of saliva. Treatment for this condition depends on the size of the stone. This information is not intended to replace advice given to you by your health care provider. Make sure you discuss any questions you have with your health care provider. Document Revised: 01/03/2021 Document Reviewed: 01/03/2021 Elsevier Patient Education  2024 [employer].

## 2023-07-18 NOTE — Telephone Encounter (Signed)
 Unable to reach patient. LMTRC

## 2023-07-18 NOTE — Progress Notes (Signed)
 Care Guide Pharmacy Note  07/18/2023 Name: [name] MRN: [id number] DOB: 18-May-1950  Referred By: Joshua Debby CROME, MD Reason for referral: Complex Care Management (Outreach to schedule referral with pharmacist ) and Call Attempt #1   Allen Whitehead is a 73 y.o. year old male who is a primary care patient of Joshua Debby CROME, MD.  Elsie DELENA Moose was referred to the pharmacist for assistance related to: HTN  Pt declined services at this time   Thedford Franks, CMA [facility]  [facility], [facility] Guide Direct Dial: [phone]  Fax: [phone] Website: [email or web address]

## 2023-07-24 NOTE — Telephone Encounter (Signed)
 Patient states that he has gotten his medication

## 2023-07-29 ENCOUNTER — Telehealth: Payer: Self-pay

## 2023-07-29 NOTE — Telephone Encounter (Signed)
 Copied from CRM [id number]. Topic: Clinical - Medication Question >> Jul 29, 2023  9:20 AM Deaijah H wrote: Reason for CRM: Melisha [place] [employer] w/ [employer]  called in taking prescription Empagliflozin -metFORMIN  HCl (SYNJARDY ) 5-500 MG TABS differently than instructed. Patient told 1 tab by mouth but misread and only been taking 1 tab a day instead of 2 tabs a day, feels like taking 1 tab dose is working better.

## 2023-08-01 NOTE — Telephone Encounter (Signed)
 Is this okay?

## 2023-09-18 ENCOUNTER — Encounter (INDEPENDENT_AMBULATORY_CARE_PROVIDER_SITE_OTHER): Payer: Self-pay | Admitting: Otolaryngology

## 2023-09-18 ENCOUNTER — Ambulatory Visit (INDEPENDENT_AMBULATORY_CARE_PROVIDER_SITE_OTHER): Admitting: Otolaryngology

## 2023-09-18 VITALS — BP 155/79 | HR 85

## 2023-09-18 DIAGNOSIS — K115 Sialolithiasis: Secondary | ICD-10-CM | POA: Diagnosis not present

## 2023-09-18 DIAGNOSIS — K1123 Chronic sialoadenitis: Secondary | ICD-10-CM | POA: Diagnosis not present

## 2023-09-18 NOTE — Progress Notes (Signed)
 First BP was elevated so took a second one. Patient stated he doesn't always take his BP medicine.

## 2023-09-18 NOTE — Progress Notes (Signed)
 ENT CONSULT:  Reason for Consult: salivary stones and L SMG swelling    HPI: Discussed the use of AI scribe software for clinical note transcription with the patient, who gave verbal consent to proceed.  History of Present Illness Allen Whitehead is a 73 year old male who presents with recurrent swelling and infection of the left submandibular salivary gland due to multiple stones noted on CT neck.  He has experienced issues with the left salivary gland for many years, characterized by occasional swelling episodes. Recently, the gland became significantly swollen and infected, marking the first time the swelling was severe enough to warrant visit to the doctor.  A scan showed multiple large stones in the left salivary gland and several along the salivary duct, as reported to the patient. After the infection cleared, the swelling reduced to a size smaller than it has been in the past.  He manages the salivary gland issue with increased hydration, warm compresses, and lemon cough drops, which he uses regularly.     Records Reviewed:  Dr Armon PCP note 07/17/23 ASSESSMENT & PLAN: A total of 32 minutes was spent with the patient and counseling/coordination of care regarding preparing for this visit, review of most recent office visit notes, review of multiple chronic medical conditions under management, review of all medications, review of CT scan report done yesterday, need for ENT evaluation, need to continue antibiotics, pain management with Aleve, prognosis, documentation, and need for follow-up.    Past Medical History:  Diagnosis Date   Anxiety    Arthritis    CAD (coronary artery disease), native coronary artery    04/23/21: 80% pLAD-DES, 50% Cx treated medically   Depression    Diabetes mellitus without complication (HCC)    Elevated plasma metanephrines    GERD (gastroesophageal reflux disease)    past hx    Hyperlipidemia with target LDL less than 70    Hypertension     Kidney stones     Past Surgical History:  Procedure Laterality Date   BIOPSY  02/15/2020   Procedure: BIOPSY;  Surgeon: Shila Gustav GAILS, MD;  Location: [facility];  Service: Endoscopy;;   COLONOSCOPY     age 83 for blood in stool- Normal    COLONOSCOPY WITH PROPOFOL  N/A 02/15/2020   Procedure: COLONOSCOPY WITH PROPOFOL ;  Surgeon: Shila Gustav GAILS, MD;  Location: [facility];  Service: Endoscopy;  Laterality: N/A;   CORONARY STENT INTERVENTION N/A 04/23/2021   Procedure: CORONARY STENT INTERVENTION;  Surgeon: Dann Candyce RAMAN, MD;  [facility]: [facility] INVASIVE CV LAB;  Service: Cardiovascular;  Laterality: N/A;   ENDOSCOPIC MUCOSAL RESECTION  02/15/2020   Procedure: ENDOSCOPIC MUCOSAL RESECTION;  Surgeon: Shila Gustav GAILS, MD;  Location: [facility];  Service: Endoscopy;;   kidney stone retrieval     lasar treatment on [place]     LEFT HEART CATH AND CORONARY ANGIOGRAPHY N/A 04/23/2021   Procedure: LEFT HEART CATH AND CORONARY ANGIOGRAPHY;  Surgeon: Dann Candyce RAMAN, MD;  Location: [facility] INVASIVE CV LAB;  Service: Cardiovascular;  Laterality: N/A;   POLYPECTOMY  02/15/2020   Procedure: POLYPECTOMY;  Surgeon: Shila Gustav GAILS, MD;  Location: [facility];  Service: Endoscopy;;   SUBMUCOSAL LIFTING INJECTION  02/15/2020   Procedure: SUBMUCOSAL LIFTING INJECTION;  Surgeon: Shila Gustav GAILS, MD;  Location: [facility];  Service: Endoscopy;;   VASECTOMY      Family History  Problem Relation Age of Onset   Cancer Mother        unsure type  Hypertension Mother    Alzheimer's disease Mother    Cancer Father    Diabetes Father    Lung cancer Father        + smoker    Lung cancer Brother    Colon cancer Other    Colon polyps Neg Hx    Esophageal cancer Neg Hx    Stomach cancer Neg Hx    Rectal cancer Neg Hx     Social History:  reports that he quit smoking about 2 years ago. His smoking use included cigarettes. He started smoking about 32 years ago. He has a 30  pack-year smoking history. He has been exposed to tobacco smoke. He has never used smokeless tobacco. He reports current alcohol use of about 2.0 standard drinks of alcohol per week. He reports that he does not use drugs.  Allergies: No Known Allergies  Medications: I have reviewed the patient's current medications.  The PMH, PSH, Medications, Allergies, and SH were reviewed and updated.  ROS: Constitutional: Negative for fever, weight loss and weight gain. Cardiovascular: Negative for chest pain and dyspnea on exertion. Respiratory: Is not experiencing shortness of breath at rest. Gastrointestinal: Negative for nausea and vomiting. Neurological: Negative for headaches. Psychiatric: The patient is not nervous/anxious  There were no vitals taken for this visit. There is no height or weight on file to calculate BMI.  PHYSICAL EXAM:  Exam: General: Well-developed, well-nourished Communication and Voice: Clear pitch and clarity Respiratory Respiratory effort: Equal inspiration and expiration without stridor Cardiovascular Peripheral Vascular: Warm extremities with equal color/perfusion Eyes: No nystagmus with equal extraocular motion bilaterally Neuro/Psych/Balance: Patient oriented to person, place, and time; Appropriate mood and affect; Gait is intact with no imbalance; Cranial nerves I-XII are intact Head and Face Inspection: Normocephalic and atraumatic without mass or lesion Palpation: Facial skeleton intact without bony stepoffs Salivary Glands: left SMG slightly bigger than the right Facial Strength: Facial motility symmetric and full bilaterally ENT Pinna: External ear intact and fully developed External canal: Canal is patent with intact skin Tympanic Membrane: Clear and mobile External Nose: No scar or anatomic deformity Lips, Teeth, and gums: Mucosa and teeth intact and viable TMJ: No pain to palpation with full mobility Oral cavity/oropharynx: No erythema or exudate,  no lesions present No palpable stone or swelling along floor of mouth Neck Neck and Trachea: Midline trachea without mass or lesion Thyroid : No mass or nodularity Lymphatics: No lymphadenopathy  Studies Reviewed: CT neck w/con 07/16/23 IMPRESSION: 1. The left submandibular gland contains numerous stones, the largest measuring up to 2.2 cm in size. The gland shows mild hyperemia/enhancement and there is mild intraglandular ductal dilatation. There are several small stones in the distal submandibular duct on the left with a diameter of 4 mm extending over a length of 1.4 cm. 2. The right submandibular gland contains a 4 mm stone that could be near the origin of the submandibular duct, but there is no evidence of obstruction or inflammation presently. 3. Erosive change of the alveolar ridge of the left maxilla which could be chronic or indicate active dental disease/osteomyelitis. 4. Aortic atherosclerosis.   Assessment/Plan: Encounter Diagnoses  Name Primary?   Chronic sialoadenitis Yes   Stone of salivary gland or duct     Assessment and Plan Assessment & Plan Left sialolithiasis with chronic sialoadenitis Multiple large stones in the left salivary gland and duct, with a history of occasional swelling. Recent infection treated with antibiotics and steroids. Observation recommended due to long history and only one  severe episode. We discussed that if he elects we can pursue removal of the stones in the distal duct first. We can consider surgery if he develops recurrent sx.  - Provided handout on salivary gland infections and swelling management. - Advised increased hydration, warm compresses, and lemon cough drops to enhance salivary flow. - Offered to prescribe steroids and antibiotics if symptoms recur. - Considered surgical intervention if symptoms return or persist, starting with duct stone removal under anesthesia.    Thank you for allowing me to participate in the care of  this patient. Please do not hesitate to contact me with any questions or concerns.   Elena Larry, MD Otolaryngology [facility] Phone: [phone] Fax: [phone]    09/18/2023, 2:16 PM

## 2023-09-18 NOTE — Patient Instructions (Signed)
 Sialoadenitis (Salivary Gland Infection)    Salivary glands make saliva, or spit. An infection in these glands can make the glands swell and hurt.  An infection can happen when bacteria gets into the gland. This is more common in people who have diabetes, poor tooth care, or stones in these glands. Bacteria can build up and cause an infection if you don't get enough fluids. It can also happen if the flow of saliva gets blocked by a small stone in the gland. A virus can also cause an infection.  Your care depends on the cause. If the problem is caused by bacteria, your doctor may prescribe antibiotics.  Home treatment may help. You can drink more fluids or suck on sugar-free lemon drops to increase the flow of saliva.  Follow-up care is a key part of your treatment and safety. Be sure to make and go to all appointments, and call your doctor if you are having problems. It's also a good idea to know your test results and keep a list of the medicines you take.  How can you care for yourself at home?  If your doctor prescribed antibiotics, take them as directed. Do not stop taking them just because you feel better. You need to take the full course of antibiotics.  Take an over-the-counter pain medicine if needed, such as acetaminophen (Tylenol), ibuprofen (Advil, Motrin), or naproxen (Aleve). Be safe with medicines. Read and follow all instructions on the label.  Do not take two or more pain medicines at the same time unless the doctor told you to. Many pain medicines have acetaminophen, which is Tylenol. Too much acetaminophen (Tylenol) can be harmful.  Drink plenty of fluids. If you have kidney, heart, or liver disease and have to limit fluids, talk with your doctor before you increase the amount of fluids you drink.  Put an ice or heat pack (whichever feels better) on the swollen jaw for 10 to 20 minutes at a time. Put a thin cloth between the ice or heat pack and your skin.  Suck on ice  chips or ice treats such as sugar-free flavoured ice pops. Eat soft foods that do not have to be chewed much.  Use sugar-free gum or candies such as lemon drops. They increase saliva.  Avoid over-the-counter medicines that can give you a dry mouth. These medicines include antihistamines, such as diphenhydramine (Benadryl) or chlorpheniramine.  Gently massage the infected gland.

## 2024-01-23 ENCOUNTER — Encounter: Payer: Self-pay | Admitting: *Deleted

## 2024-01-23 NOTE — Progress Notes (Signed)
 Allen Whitehead                                          MRN: [id number]   01/23/2024   The [facility] Quality Team Specialist reviewed this patient medical record for the purposes of chart review for care gap closure. The following were reviewed: chart review for care gap closure-controlling blood pressure.    [facility] Quality Team
